# Patient Record
Sex: Female | Born: 1945 | Race: White | Hispanic: No | Marital: Married | Smoking: Never smoker
Health system: Southern US, Community
[De-identification: ages and names within clinical notes are randomized; demographics above are authoritative.]

## PROBLEM LIST (undated history)

## (undated) DIAGNOSIS — L409 Psoriasis, unspecified: Secondary | ICD-10-CM

## (undated) DIAGNOSIS — M1990 Unspecified osteoarthritis, unspecified site: Secondary | ICD-10-CM

## (undated) HISTORY — DX: Psoriasis, unspecified: L40.9

## (undated) HISTORY — PX: TOTAL HIP ARTHROPLASTY: SHX124

## (undated) HISTORY — DX: Unspecified osteoarthritis, unspecified site: M19.90

## (undated) HISTORY — PX: JOINT REPLACEMENT: SHX530

---

## 1966-01-15 HISTORY — PX: OTHER SURGICAL HISTORY: SHX169

## 2003-01-16 HISTORY — PX: VEIN LIGATION AND STRIPPING: SHX2653

## 2011-01-16 DIAGNOSIS — H9311 Tinnitus, right ear: Secondary | ICD-10-CM

## 2011-01-16 HISTORY — DX: Tinnitus, right ear: H93.11

## 2017-01-15 HISTORY — PX: OTHER SURGICAL HISTORY: SHX169

## 2019-01-02 LAB — HM DEXA SCAN

## 2019-01-05 ENCOUNTER — Other Ambulatory Visit (HOSPITAL_COMMUNITY): Payer: Self-pay | Admitting: Interventional Radiology

## 2019-01-05 ENCOUNTER — Encounter: Payer: Self-pay | Admitting: Physician Assistant

## 2019-01-05 DIAGNOSIS — M8580 Other specified disorders of bone density and structure, unspecified site: Secondary | ICD-10-CM | POA: Insufficient documentation

## 2019-01-05 DIAGNOSIS — M8588 Other specified disorders of bone density and structure, other site: Secondary | ICD-10-CM

## 2019-01-05 DIAGNOSIS — S22070A Wedge compression fracture of T9-T10 vertebra, initial encounter for closed fracture: Secondary | ICD-10-CM

## 2019-01-19 ENCOUNTER — Other Ambulatory Visit: Payer: Self-pay | Admitting: Radiology

## 2019-01-19 ENCOUNTER — Other Ambulatory Visit: Payer: Self-pay

## 2019-01-20 ENCOUNTER — Other Ambulatory Visit: Payer: Self-pay

## 2019-01-20 ENCOUNTER — Ambulatory Visit (INDEPENDENT_AMBULATORY_CARE_PROVIDER_SITE_OTHER): Payer: Medicare Other | Admitting: Physician Assistant

## 2019-01-20 ENCOUNTER — Encounter: Payer: Self-pay | Admitting: Physician Assistant

## 2019-01-20 ENCOUNTER — Encounter (HOSPITAL_COMMUNITY): Payer: Self-pay

## 2019-01-20 ENCOUNTER — Ambulatory Visit (HOSPITAL_COMMUNITY)
Admission: RE | Admit: 2019-01-20 | Discharge: 2019-01-20 | Disposition: A | Discharge status: Home / Self Care | Payer: Medicare Other | Source: Ambulatory Visit | Attending: Interventional Radiology | Admitting: Interventional Radiology

## 2019-01-20 VITALS — BP 136/80 | HR 78 | Temp 97.3°F | Ht 67.0 in | Wt 196.5 lb

## 2019-01-20 DIAGNOSIS — W19XXXA Unspecified fall, initial encounter: Secondary | ICD-10-CM | POA: Insufficient documentation

## 2019-01-20 DIAGNOSIS — M48061 Spinal stenosis, lumbar region without neurogenic claudication: Secondary | ICD-10-CM | POA: Insufficient documentation

## 2019-01-20 DIAGNOSIS — S22070A Wedge compression fracture of T9-T10 vertebra, initial encounter for closed fracture: Secondary | ICD-10-CM | POA: Insufficient documentation

## 2019-01-20 DIAGNOSIS — M2011 Hallux valgus (acquired), right foot: Secondary | ICD-10-CM

## 2019-01-20 DIAGNOSIS — M159 Polyosteoarthritis, unspecified: Secondary | ICD-10-CM | POA: Insufficient documentation

## 2019-01-20 DIAGNOSIS — L409 Psoriasis, unspecified: Secondary | ICD-10-CM | POA: Diagnosis not present

## 2019-01-20 DIAGNOSIS — Z7982 Long term (current) use of aspirin: Secondary | ICD-10-CM | POA: Diagnosis not present

## 2019-01-20 DIAGNOSIS — M1990 Unspecified osteoarthritis, unspecified site: Secondary | ICD-10-CM | POA: Insufficient documentation

## 2019-01-20 DIAGNOSIS — M549 Dorsalgia, unspecified: Secondary | ICD-10-CM | POA: Diagnosis not present

## 2019-01-20 DIAGNOSIS — R9431 Abnormal electrocardiogram [ECG] [EKG]: Secondary | ICD-10-CM | POA: Insufficient documentation

## 2019-01-20 DIAGNOSIS — H9311 Tinnitus, right ear: Secondary | ICD-10-CM | POA: Insufficient documentation

## 2019-01-20 HISTORY — PX: IR KYPHO THORACIC WITH BONE BIOPSY: IMG5518

## 2019-01-20 LAB — BASIC METABOLIC PANEL
Anion gap: 11 (ref 5–15)
BUN: 20 mg/dL (ref 8–23)
CO2: 24 mmol/L (ref 22–32)
Calcium: 9.3 mg/dL (ref 8.9–10.3)
Chloride: 107 mmol/L (ref 98–111)
Creatinine, Ser: 0.59 mg/dL (ref 0.44–1.00)
GFR calc Af Amer: >60 mL/min (ref >60)
GFR calc non Af Amer: >60 mL/min (ref >60)
Glucose, Bld: 103 mg/dL — ABNORMAL HIGH (ref 70–99)
Potassium: 4 mmol/L (ref 3.5–5.1)
Sodium: 142 mmol/L (ref 135–145)

## 2019-01-20 LAB — CBC
HCT: 44 % (ref 36.0–46.0)
Hemoglobin: 14 g/dL (ref 12.0–15.0)
MCH: 29.4 pg (ref 26.0–34.0)
MCHC: 31.8 g/dL (ref 30.0–36.0)
MCV: 92.2 fL (ref 80.0–100.0)
Platelets: 305 K/uL (ref 150–400)
RBC: 4.77 MIL/uL (ref 3.87–5.11)
RDW: 14.7 % (ref 11.5–15.5)
WBC: 7.1 K/uL (ref 4.0–10.5)
nRBC: 0 % (ref 0.0–0.2)

## 2019-01-20 LAB — URINALYSIS, COMPLETE (UACMP) WITH MICROSCOPIC
Bacteria, UA: NONE SEEN
Bilirubin Urine: NEGATIVE
Glucose, UA: NEGATIVE mg/dL
Hgb urine dipstick: NEGATIVE
Ketones, ur: NEGATIVE mg/dL
Leukocytes,Ua: NEGATIVE
Nitrite: NEGATIVE
Protein, ur: NEGATIVE mg/dL
Specific Gravity, Urine: 1.024 (ref 1.005–1.030)
pH: 5 (ref 5.0–8.0)

## 2019-01-20 LAB — PROTIME-INR
INR: 1 (ref 0.8–1.2)
Prothrombin Time: 12.9 s (ref 11.4–15.2)

## 2019-01-20 MED ORDER — IOHEXOL 300 MG/ML  SOLN
50.0000 mL | Freq: Once | INTRAMUSCULAR | Status: AC | PRN
  Administered 2019-01-20: 13:00:00 3 mL

## 2019-01-20 MED ORDER — ACETAMINOPHEN 325 MG PO TABS
650.0000 mg | ORAL_TABLET | Freq: Once | ORAL | Status: AC
  Administered 2019-01-20: 650 mg via ORAL

## 2019-01-20 MED ORDER — SODIUM CHLORIDE 0.9 % IV SOLN: INTRAVENOUS | Status: DC

## 2019-01-20 MED ORDER — CEFAZOLIN SODIUM-DEXTROSE 2-4 GM/100ML-% IV SOLN: 2.0000 g | INTRAVENOUS | Status: AC

## 2019-01-20 MED ORDER — BUPIVACAINE HCL (PF) 0.5 % IJ SOLN
INTRAMUSCULAR | Status: AC | PRN
  Administered 2019-01-20: 20 mL

## 2019-01-20 MED ORDER — FENTANYL CITRATE (PF) 100 MCG/2ML IJ SOLN
INTRAMUSCULAR | Status: AC
  Filled 2019-01-20: qty 2

## 2019-01-20 MED ORDER — FENTANYL CITRATE (PF) 100 MCG/2ML IJ SOLN
INTRAMUSCULAR | Status: AC | PRN
  Administered 2019-01-20 (×2): 25 ug via INTRAVENOUS

## 2019-01-20 MED ORDER — MIDAZOLAM HCL 2 MG/2ML IJ SOLN
INTRAMUSCULAR | Status: AC
  Filled 2019-01-20: qty 2

## 2019-01-20 MED ORDER — ACETAMINOPHEN 325 MG PO TABS
ORAL_TABLET | ORAL | Status: AC
  Filled 2019-01-20: qty 2

## 2019-01-20 MED ORDER — GELATIN ABSORBABLE 12-7 MM EX MISC
CUTANEOUS | Status: AC
  Filled 2019-01-20: qty 1

## 2019-01-20 MED ORDER — SODIUM CHLORIDE 0.9 % IV SOLN: INTRAVENOUS | Status: AC

## 2019-01-20 MED ORDER — BUPIVACAINE HCL (PF) 0.5 % IJ SOLN
INTRAMUSCULAR | Status: AC
  Filled 2019-01-20: qty 30

## 2019-01-20 MED ORDER — TOBRAMYCIN SULFATE 1.2 G IJ SOLR
INTRAMUSCULAR | Status: AC
  Filled 2019-01-20: qty 1.2

## 2019-01-20 MED ORDER — CEFAZOLIN SODIUM-DEXTROSE 2-4 GM/100ML-% IV SOLN
INTRAVENOUS | Status: AC
  Administered 2019-01-20: 2 g via INTRAVENOUS
  Filled 2019-01-20: qty 100

## 2019-01-20 MED ORDER — MIDAZOLAM HCL 2 MG/2ML IJ SOLN
INTRAMUSCULAR | Status: AC | PRN
  Administered 2019-01-20: 1 mg via INTRAVENOUS
  Administered 2019-01-20 (×2): 0.5 mg via INTRAVENOUS

## 2019-01-20 NOTE — Discharge Instructions (Signed)
Balloon Kyphoplasty, Care After This sheet gives you information about how to care for yourself after your procedure. Your health care provider may also give you more specific instructions. If you have problems or questions, contact your health care provider. What can I expect after the procedure? After your procedure, it is common to have back pain. Follow these instructions at home: Medicines  Take over-the-counter and prescription medicines only as told by your health care provider.  Ask your health care provider if the medicine prescribed to you: ? Requires you to avoid driving or using heavy machinery. ? Can cause constipation. You may need to take steps to prevent or treat constipation, such as:  Drink enough fluid to keep your urine pale yellow.  Take over-the-counter or prescription medicines.  Eat foods that are high in fiber, such as beans, whole grains, and fresh fruits and vegetables.  Limit foods that are high in fat and processed sugars, such as fried or sweet foods. Puncture site care   Follow instructions from your health care provider about how to take care of your puncture site. Make sure you: ? Wash your hands with soap and water before and after you change your bandage (dressing). If soap and water are not available, use hand sanitizer. ? Change your dressing as told by your health care provider. ? Leave skin glue or adhesive strips in place. These skin closures may need to be in place for 2 weeks or longer. If adhesive strip edges start to loosen and curl up, you may trim the loose edges. Do not remove adhesive strips completely unless your health care provider tells you to do that.  Check your puncture site every day for signs of infection. Watch for: ? Redness, swelling, or pain. ? Fluid or blood. ? Warmth. ? Pus or a bad smell.  Keep your dressing dry until your health care provider says that it can be removed. Managing pain, stiffness, and swelling   If  directed, put ice on the painful area. ? Put ice in a plastic bag. ? Place a towel between your skin and the bag. ? Leave the ice on for 20 minutes, 2-3 times a day. Activity  Rest your back and avoid intense physical activity for as long as told by your health care provider.  Avoid bending, lifting, or twisting your back for as long as told by your health care provider.  Return to your normal activities as told by your health care provider. Ask your health care provider what activities are safe for you.  Do not lift anything that is heavier than 5 lb (2.2 kg). You may need to avoid heavy lifting for several weeks. General instructions  Do not use any products that contain nicotine or tobacco, such as cigarettes, e-cigarettes, and chewing tobacco. These can delay bone healing. If you need help quitting, ask your health care provider.  Do not drive for 24 hours if you were given a sedative during your procedure.  Keep all follow-up visits as told by your health care provider. This is important. Contact a health care provider if:  You have a fever or chills.  You have redness, swelling, or pain at the site of your puncture.  You have fluid, blood, or pus coming from the puncture site.  You have pain that gets worse or does not get better with medicine.  You develop numbness or weakness in any part of your body. Get help right away if:  You have chest pain.  You have difficulty breathing.  You have weakness, numbness, or tingling in your legs.  You cannot control your bladder or bowel movements.  You suddenly become weak or numb on one side of your body.  You become very confused.  You have trouble speaking or understanding, or both. Summary  Follow instructions from your health care provider about how to take care of your puncture site.  Take over-the-counter and prescription medicines only as told by your health care provider.  Rest your back and avoid intense  physical activity for as long as told by your health care provider.  Contact a health care provider if you have pain that gets worse or does not get better with medicine.  Keep all follow-up visits as told by your health care provider. This is important. This information is not intended to replace advice given to you by your health care provider. Make sure you discuss any questions you have with your health care provider. Document Revised: 12/09/2017 Document Reviewed: 12/09/2017 Elsevier Patient Education  2020 [employer]   1.. No stooping,or bending or lifting more than 10 lbs for 2 weeks. 2.. Use walker to ambulate for 2 weeks. Marland Kitchen3.No driving for 2 weeks. 4.RTC PRN in 2 weeks.

## 2019-01-20 NOTE — Progress Notes (Signed)
Urine obtained and sent to lab  

## 2019-01-20 NOTE — Procedures (Signed)
S/P T 10 balloon KP. S.Zorina Mallin MD 

## 2019-01-20 NOTE — H&P (Signed)
Chief Complaint: Patient was seen in consultation today for T 10 Kyphoplasty at the request of Dr Renda Rolls   Supervising Physician: Julieanne Cotton  Patient Status: [facility] - Out-pt  History of Present Illness: [name] is a 74 y.o. female   Pt has had increasing back pain in mid to low back since October 2020 Moved to [place], [place] from [place] Did have fall in moving-- but pain was actually already there before fall Pain meds without relief Pain is 7-8/10  Of note, pt has had previous KP maybe 6 yrs ago-- not sure of level  MRI 12/25/18: IMPRESSION: 1. Acute/subacute mild superior endplate compression fracture of T10 with associated bone marrow edema. No significant osseous retropulsion. 2. Previous T7 spinal augmentation. No residual bone marrow edema. 3. Probable insufficiency fracture of the left sacrum, incompletely visualized. 4. 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 5. 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 Mild left foraminal narrowing at L2-3. Additional disc degeneration and small disc protrusions as described. 6. 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  Referral to Dr Corliss Skains for evaluation and management/treatment of T10 painful fracture Here today for same  Past Medical History:  Diagnosis Date  . Arthritis   . Psoriasis    Intermittent; all over body and waxes and wanes  . Tinnitus of right ear 2013   Intermittent; Melatonin has helped  . Vaginal delivery 1973, 1981    Past Surgical History:  Procedure Laterality Date  . 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 JOINT REPLACEMENT    . Knee replacement Right 2019  . TOTAL HIP ARTHROPLASTY     Lt 2002, Rt 2005  . VEIN LIGATION AND STRIPPING Right 2005     Allergies: Patient has no known allergies.  Medications: Prior to Admission medications   Medication Sig Start Date End Date Taking? Authorizing Provider  aspirin EC 81 MG tablet Take 81 mg by mouth daily.    [provider]  ibuprofen (ADVIL) 200 MG tablet Take 600 mg by mouth every 6 (six) hours as needed.    [provider]  Melatonin 10 MG SUBL Place 1 tablet under the tongue at bedtime.    [provider]  naproxen sodium (ALEVE) 220 MG tablet Take 220 mg by mouth daily as needed.    [provider]     Family History  Problem Relation Age of Onset  . Dementia Mother   . Heart disease Father        CABG x 5  . Psoriasis Sister   . Osteoarthritis Sister   . Pneumonia Maternal Grandfather   . Heart attack Paternal Grandmother     Social History   Socioeconomic History  . Marital status: Married    Spouse name: Not on file  . Number of children: Not on file  . Years of education: Not on file  . Highest education level: Not on file  Occupational History  . Not on file  Tobacco Use  . Smoking status: Never Smoker  . Smokeless tobacco: Never Used  Substance and Sexual Activity  . Alcohol use: Never  . Drug use: Never  . Sexual activity: Not Currently  Other Topics Concern  . 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 Difficulty of Paying Living Expenses: Not on file  Food Insecurity:   . Worried About Charity fundraiser in the Last Year: Not on file  . Ran Out of Food in the Last Year: Not on file  Transportation Needs:   . Lack of Transportation (Medical): Not on file  . Lack of Transportation (Non-Medical): Not on file  Physical Activity:   . Days of Exercise per Week: Not on file  . Minutes of Exercise per Session: Not on file  Stress:   . Feeling of Stress : Not on file  Social  Connections:   . Frequency of Communication with Friends and Family: Not on file  . Frequency of Social Gatherings with Friends and Family: Not on file  . Attends Religious Services: Not on file  . Active Member of Clubs or Organizations: Not on file  . Attends Archivist Meetings: Not on file  . Marital Status: Not on file    Review of Systems: A 12 point ROS discussed and pertinent positives are indicated in the HPI above.  All other systems are negative.  Review of Systems  Constitutional: Positive for activity change. Negative for fatigue.  Respiratory: Negative for cough and shortness of breath.   Cardiovascular: Negative for chest pain.  Gastrointestinal: Negative for abdominal pain.  Musculoskeletal: Positive for back pain.  Neurological: Negative for weakness.  Psychiatric/Behavioral: Negative for behavioral problems and confusion.    Vital Signs: BP 136/82   Pulse 81   Temp 97.9 F (36.6 C)   Resp 18   Ht 5\' 8"  (1.727 m)   Wt 190 lb (86.2 kg)   SpO2 96%   BMI 28.89 kg/m   Physical Exam Vitals reviewed.  Cardiovascular:     Rate and Rhythm: Normal rate and regular rhythm.     Heart sounds: Normal heart sounds.  Pulmonary:     Effort: Pulmonary effort is normal.     Breath sounds: Normal breath sounds.  Abdominal:     Palpations: Abdomen is soft.     Tenderness: There is no abdominal tenderness.  Musculoskeletal:        General: Normal range of motion.     Comments: Low back pain  Skin:    General: Skin is warm and dry.  Neurological:     Mental Status: She is alert.  Psychiatric:        Mood and Affect: Mood normal.        Behavior: Behavior normal.        Thought Content: Thought content normal.        Judgment: Judgment normal.     Imaging: No results found.  Labs:  CBC: No results for input(s): WBC, HGB, HCT, PLT in the last 8760 hours.  COAGS: No results for input(s): INR, APTT in the last 8760 hours.  BMP: No results for  input(s): NA, K, CL, CO2, GLUCOSE, BUN, CALCIUM, CREATININE, GFRNONAA, GFRAA in the last 8760 hours.  Invalid input(s): CMP  LIVER FUNCTION TESTS: No results for input(s): BILITOT, AST, ALT, ALKPHOS, PROT, ALBUMIN in the last 8760 hours.  TUMOR MARKERS: No results for input(s): AFPTM, CEA, CA199, CHROMGRNA in the last 8760 hours.  Assessment and Plan:  Painful T10 acute fracture MRI revealing edema and acute fracture Scheduled now for Kyphoplasty Risks and benefits of T10 KP were discussed with the patient  including, but not limited to education regarding the natural healing process of compression fractures without intervention, bleeding, infection, cement migration which may cause spinal cord damage, paralysis, pulmonary embolism or even death.  This interventional procedure involves the use of X-rays and because of the nature of the planned procedure, it is possible that we will have prolonged use of X-ray fluoroscopy.  Potential radiation risks to you include (but are not limited to) the following: - A slightly elevated risk for cancer  several years later in life. This risk is typically less than 0.5% percent. This risk is low in comparison to the normal incidence of human cancer, which is 33% for women and 50% for men according to the American Cancer Society. - Radiation induced injury can include skin redness, resembling a rash, tissue breakdown / ulcers and hair loss (which can be temporary or permanent).   The likelihood of either of these occurring depends on the difficulty of the procedure and whether you are sensitive to radiation due to previous procedures, disease, or genetic conditions.   IF your procedure requires a prolonged use of radiation, you will be notified and given written instructions for further action.  It is your responsibility to monitor the irradiated area for the 2 weeks following the procedure and to notify your physician if you are concerned that you have  suffered a radiation induced injury.    All of the patient's questions were answered, patient is agreeable to proceed.  Consent signed and in chart.  Thank you for this interesting consult.  I greatly enjoyed meeting [name] and look forward to participating in their care.  A copy of this report was sent to the requesting provider on this date.  Electronically Signed: Robet Leu, PA-C 01/20/2019, 10:31 AM   I spent a total of  30 Minutes   in face to face in clinical consultation, greater than 50% of which was counseling/coordinating care for T10 KP

## 2019-01-20 NOTE — Progress Notes (Signed)
Instructed to obtain urine speci. Ambulated to bathroom to void

## 2019-01-20 NOTE — Progress Notes (Signed)
[name] is a 74 y.o. female here to Establish care.  I acted as a Neurosurgeon for [employer], PA-C Corky Mull, LPN  History of Present Illness:   Chief Complaint  Patient presents with  . Establish Care   Bunion -- R foot with significant bunion; worsened after R knee surgery; having significant pain and her toes have started shifting; also has calluses forming on her toes.   Psoriasis -- has had shots and creams in the past, hasn't used in years. Currently uses cocoa butter in troublesome places. Uses cetaphil as well. Would like referral to local dermatologist.  Health Maintenance: Immunizations -- declined, will get Flu shot later Colonoscopy -- will get  (only has had 1) Mammogram -- overdue, last done 5-6 yrs ago PAP --No Hx of abnormal paps Bone Density -- UTD Weight -- Weight: 196 lb 8 oz (89.1 kg)   Depression screen [facility] 2/9 01/20/2019  Decreased Interest 0  Down, Depressed, Hopeless 0  PHQ - 2 Score 0   No flowsheet data found.   Other providers/specialists: Patient Care Team: [name], [place] as PCP - General (Physician Assistant)   Past Medical History:  Diagnosis Date  . Arthritis   . Psoriasis    Intermittent; all over body and waxes and wanes  . Tinnitus of right ear 2013   Intermittent; Melatonin has helped  . Vaginal delivery 1973, 1981     Social History   Socioeconomic History  . Marital status: Married    Spouse name: Not on file  . Number of children: Not on file  . Years of education: Not on file  . Highest education level: Not on file  Occupational History  . Not on file  Tobacco Use  . Smoking status: Never Smoker  . Smokeless tobacco: Never Used  Substance and Sexual Activity  . Alcohol use: Never  . Drug use: Never  . Sexual activity: Not Currently  Other Topics Concern  . 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 Difficulty of Paying Living Expenses: Not on file  Food Insecurity:   . Worried About Programme researcher, broadcasting/film/video in the Last Year: Not on file  . Ran Out of Food in the Last Year: Not on file  Transportation Needs:   . Lack of Transportation (Medical): Not on file  . Lack of Transportation (Non-Medical): Not on file  Physical Activity:   . Days of Exercise per Week: Not on file  . Minutes of Exercise per Session: Not on file  Stress:   . Feeling of Stress : Not on file  Social Connections:   . Frequency of Communication with Friends and Family: Not on file  . Frequency of Social Gatherings with Friends and Family: Not on file  . Attends Religious Services: Not on file  . Active Member of Clubs or Organizations: Not on file  . Attends Banker Meetings: Not on file  . Marital Status: Not on file  Intimate Partner Violence:   . Fear of Current or Ex-Partner: Not on file  . Emotionally Abused: Not on file  . Physically Abused: Not on file  . Sexually Abused: Not on file    Past Surgical History:  Procedure Laterality Date  . 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 JOINT REPLACEMENT    .  Knee replacement Right 2019  . TOTAL HIP ARTHROPLASTY     Lt 2002, Rt 2005  . VEIN LIGATION AND STRIPPING Right 2005    Family History  Problem Relation Age of Onset  . Dementia Mother   . Heart disease Father        CABG x 5  . Psoriasis Sister   . Osteoarthritis Sister   . Pneumonia Maternal Grandfather   . Heart attack Paternal Grandmother     No Known Allergies   Current Medications:   Current Outpatient Medications:  .  aspirin EC 81 MG tablet, Take 81 mg by mouth daily., Disp: , Rfl:  .  ibuprofen (ADVIL) 200 MG tablet, Take 600 mg by mouth every 6 (six) hours as needed., Disp: , Rfl:  .  Melatonin 10 MG SUBL, Place 1 tablet under the tongue at bedtime., Disp: , Rfl:  .  naproxen sodium (ALEVE) 220 MG tablet, Take 220 mg by mouth  daily as needed., Disp: , Rfl:    Review of Systems:   ROS  Negative unless otherwise specified per HPI.  Vitals:   Vitals:   01/20/19 0830  BP: 136/80  Pulse: 78  Temp: (!) 97.3 F (36.3 C)  TempSrc: Temporal  SpO2: 95%  Weight: 196 lb 8 oz (89.1 kg)  Height: 5\' 7"  (1.702 m)      Body mass index is 30.78 kg/m.  Physical Exam:   Physical Exam Vitals and nursing note reviewed.  Constitutional:      General: She is not in acute distress.    Appearance: She is well-developed. She is not ill-appearing or toxic-appearing.  Cardiovascular:     Rate and Rhythm: Normal rate and regular rhythm.     Pulses: Normal pulses.     Heart sounds: Normal heart sounds, S1 normal and S2 normal.     Comments: No LE edema Pulmonary:     Effort: Pulmonary effort is normal.     Breath sounds: Normal breath sounds.  Feet:     Comments: Significant hallux valgus deformity of R foot Skin:    General: Skin is warm and dry.     Comments: Scattered erythematous plaques to body, notably on R shin  Neurological:     Mental Status: She is alert.     GCS: GCS eye subscore is 4. GCS verbal subscore is 5. GCS motor subscore is 6.  Psychiatric:        Speech: Speech normal.        Behavior: Behavior normal. Behavior is cooperative.     Assessment and Plan:   [name] was seen today for establish care.  Diagnoses and all orders for this visit:  Hallux valgus (acquired), right foot Referral to podiatry per patient request. -     Ambulatory referral to Podiatry  Psoriasis Referral to dermatology per patient request. -     Ambulatory referral to Dermatology  . Reviewed expectations re: course of current medical issues. . Discussed self-management of symptoms. . Outlined signs and symptoms indicating need for more acute intervention. . Patient verbalized understanding and all questions were answered. . See orders for this visit as documented in the electronic medical record. . Patient  received an After-Visit Summary.  CMA or LPN served as scribe during this visit. History, Physical, and Plan performed by medical provider. The above documentation has been reviewed and is accurate and complete.  Inda Coke, PA-C

## 2019-01-20 NOTE — Progress Notes (Signed)
Discharge instructions with pt and her husband (via telephone) both voice understanding.

## 2019-01-20 NOTE — Patient Instructions (Addendum)
You will be contacted about your referrals to dermatology and podiatry.  Your bone scan showed osteopenia   Osteopenia  Osteopenia is a loss of thickness (density) inside of the bones. Another name for osteopenia is low bone mass. Mild osteopenia is a normal part of aging. It is not a disease, and it does not cause symptoms. However, if you have osteopenia and continue to lose bone mass, you could develop a condition that causes the bones to become thin and break more easily (osteoporosis). You may also lose some height, have back pain, and have a stooped posture. Although osteopenia is not a disease, making changes to your lifestyle and diet can help to prevent osteopenia from developing into osteoporosis. What are the causes? Osteopenia is caused by loss of calcium in the bones.  Bones are constantly changing. Old bone cells are continually being replaced with new bone cells. This process builds new bone. The mineral calcium is needed to build new bone and maintain bone density. Bone density is usually highest around age 73. After that, most people's bodies cannot replace all the bone they have lost with new bone. What increases the risk? You are more likely to develop this condition if:  You are older than age 38.  You are a woman who went through menopause early.  You have a long illness that keeps you in bed.  You do not get enough exercise.  You lack certain nutrients (malnutrition).  You have an overactive thyroid gland (hyperthyroidism).  You smoke.  You drink a lot of alcohol.  You are taking medicines that weaken the bones, such as steroids. What are the signs or symptoms? This condition does not cause any symptoms. You may have a slightly higher risk for bone breaks (fractures), so getting fractures more easily than normal may be an indication of osteopenia. How is this diagnosed? Your health care provider can diagnose this condition with a special type of X-ray exam  that measures bone density (dual-energy X-ray absorptiometry, DEXA). This test can measure bone density in your hips, spine, and wrists. Osteopenia has no symptoms, so this condition is usually diagnosed after a routine bone density screening test is done for osteoporosis. This routine screening is usually done for:  Women who are age 79 or older.  Men who are age 47 or older. If you have risk factors for osteopenia, you may have the screening test at an earlier age. How is this treated? Making dietary and lifestyle changes can lower your risk for osteoporosis. If you have severe osteopenia that is close to becoming osteoporosis, your health care provider may prescribe medicines and dietary supplements such as calcium and vitamin D. These supplements help to rebuild bone density. Follow these instructions at home:   Take over-the-counter and prescription medicines only as told by your health care provider. These include vitamins and supplements.  Eat a diet that is high in calcium and vitamin D. ? Calcium is found in dairy products, beans, salmon, and leafy green vegetables like spinach and broccoli. ? Look for foods that have vitamin D and calcium added to them (fortified foods), such as orange juice, cereal, and bread.  Do 30 or more minutes of a weight-bearing exercise every day, such as walking, jogging, or playing a sport. These types of exercises strengthen the bones.  Take precautions at home to lower your risk of falling, such as: ? Keeping rooms well-lit and free of clutter, such as cords. ? Installing safety rails on stairs. ?  Using rubber mats in the bathroom or other areas that are often wet or slippery.  Do not use any products that contain nicotine or tobacco, such as cigarettes and e-cigarettes. If you need help quitting, ask your health care provider.  Avoid alcohol or limit alcohol intake to no more than 1 drink a day for nonpregnant women and 2 drinks a day for men. One  drink equals 12 oz of beer, 5 oz of wine, or 1 oz of hard liquor.  Keep all follow-up visits as told by your health care provider. This is important. Contact a health care provider if:  You have not had a bone density screening for osteoporosis and you are: ? A woman, age 51 or older. ? A man, age 82 or older.  You are a postmenopausal woman who has not had a bone density screening for osteoporosis.  You are older than age 60 and you want to know if you should have bone density screening for osteoporosis. Summary  Osteopenia is a loss of thickness (density) inside of the bones. Another name for osteopenia is low bone mass.  Osteopenia is not a disease, but it may increase your risk for a condition that causes the bones to become thin and break more easily (osteoporosis).  You may be at risk for osteopenia if you are older than age 4 or if you are a woman who went through early menopause.  Osteopenia does not cause any symptoms, but it can be diagnosed with a bone density screening test.  Dietary and lifestyle changes are the first treatment for osteopenia. These may lower your risk for osteoporosis. This information is not intended to replace advice given to you by your health care provider. Make sure you discuss any questions you have with your health care provider. Document Revised: 12/14/2016 Document Reviewed: 10/10/2016 Elsevier Patient Education  2020 [employer].

## 2019-02-16 ENCOUNTER — Ambulatory Visit (INDEPENDENT_AMBULATORY_CARE_PROVIDER_SITE_OTHER): Payer: Medicare Other

## 2019-02-16 ENCOUNTER — Other Ambulatory Visit: Payer: Self-pay

## 2019-02-16 ENCOUNTER — Ambulatory Visit (INDEPENDENT_AMBULATORY_CARE_PROVIDER_SITE_OTHER): Payer: Medicare Other | Admitting: Podiatry

## 2019-02-16 ENCOUNTER — Other Ambulatory Visit: Payer: Self-pay | Admitting: Podiatry

## 2019-02-16 DIAGNOSIS — M79672 Pain in left foot: Secondary | ICD-10-CM | POA: Diagnosis not present

## 2019-02-16 DIAGNOSIS — M79671 Pain in right foot: Secondary | ICD-10-CM

## 2019-02-16 DIAGNOSIS — M2041 Other hammer toe(s) (acquired), right foot: Secondary | ICD-10-CM | POA: Diagnosis not present

## 2019-02-16 DIAGNOSIS — M2042 Other hammer toe(s) (acquired), left foot: Secondary | ICD-10-CM | POA: Diagnosis not present

## 2019-02-16 NOTE — Patient Instructions (Signed)
Bunion  A bunion is a bump on the base of the big toe that forms when the bones of the big toe joint move out of position. Bunions may be small at first, but they often get larger over time. They can make walking painful. What are the causes? A bunion may be caused by:  Wearing narrow or pointed shoes that force the big toe to press against the other toes.  Abnormal foot development that causes the foot to roll inward (pronate).  Changes in the foot that are caused by certain diseases, such as rheumatoid arthritis or polio.  A foot injury. What increases the risk? The following factors may make you more likely to develop this condition:  Wearing shoes that squeeze the toes together.  Having certain diseases, such as: ? Rheumatoid arthritis. ? Polio. ? Cerebral palsy.  Having family members who have bunions.  Being born with a foot deformity, such as flat feet or low arches.  Doing activities that put a lot of pressure on the feet, such as ballet dancing. What are the signs or symptoms? The main symptom of a bunion is a noticeable bump on the big toe. Other symptoms may include:  Pain.  Swelling around the big toe.  Redness and inflammation.  Thick or hardened skin on the big toe or between the toes.  Stiffness or loss of motion in the big toe.  Trouble with walking. How is this diagnosed? A bunion may be diagnosed based on your symptoms, medical history, and activities. You may have tests, such as:  X-rays. These allow your health care provider to check the position of the bones in your foot and look for damage to your joint. They also help your health care provider determine the severity of your bunion and the best way to treat it.  Joint aspiration. In this test, a sample of fluid is removed from the toe joint. This test may be done if you are in a lot of pain. It helps rule out diseases that cause painful swelling of the joints, such as arthritis. How is this  treated? Treatment depends on the severity of your symptoms. The goal of treatment is to relieve symptoms and prevent the bunion from getting worse. Your health care provider may recommend:  Wearing shoes that have a wide toe box.  Using bunion pads to cushion the affected area.  Taping your toes together to keep them in a normal position.  Placing a device inside your shoe (orthotics) to help reduce pressure on your toe joint.  Taking medicine to ease pain, inflammation, and swelling.  Applying heat or ice to the affected area.  Doing stretching exercises.  Surgery to remove scar tissue and move the toes back into their normal position. This treatment is rare. Follow these instructions at home: Managing pain, stiffness, and swelling   If directed, put ice on the painful area: ? Put ice in a plastic bag. ? Place a towel between your skin and the bag. ? Leave the ice on for 20 minutes, 2-3 times a day. Activity   If directed, apply heat to the affected area before you exercise. Use the heat source that your health care provider recommends, such as a moist heat pack or a heating pad. ? Place a towel between your skin and the heat source. ? Leave the heat on for 20-30 minutes. ? Remove the heat if your skin turns bright red. This is especially important if you are unable to feel pain,   heat, or cold. You may have a greater risk of getting burned.  Do exercises as told by your health care provider. General instructions  Support your toe joint with proper footwear, shoe padding, or taping as told by your health care provider.  Take over-the-counter and prescription medicines only as told by your health care provider.  Keep all follow-up visits as told by your health care provider. This is important. Contact a health care provider if your symptoms:  Get worse.  Do not improve in 2 weeks. Get help right away if you have:  Severe pain and trouble with walking. Summary  A  bunion is a bump on the base of the big toe that forms when the bones of the big toe joint move out of position.  Bunions can make walking painful.  Treatment depends on the severity of your symptoms.  Support your toe joint with proper footwear, shoe padding, or taping as told by your health care provider. This information is not intended to replace advice given to you by your health care provider. Make sure you discuss any questions you have with your health care provider. Document Revised: 07/08/2017 Document Reviewed: 05/14/2017 Elsevier Patient Education  2020 [employer]  

## 2019-02-16 NOTE — Progress Notes (Signed)
Subjective:   Patient ID: [name], female   DOB: 74 y.o.   MRN: [id number]   HPI Patient presents stating I have a painful bunion deformity right and hammertoe deformity second and third right over left and states that she is needed surgery on this but she had to first have her knee done last year and she is now ready to have this corrected.  States she is tried wider shoes and other modalities without relief and patient does not smoke likes to be active   Review of Systems  All other systems reviewed and are negative.       Objective:  Physical Exam Vitals and nursing note reviewed.  Constitutional:      Appearance: She is well-developed.  Pulmonary:     Effort: Pulmonary effort is normal.  Musculoskeletal:        General: Normal range of motion.  Skin:    General: Skin is warm.  Neurological:     Mental Status: She is alert.     Neurovascular status found to be intact muscle strength found to be adequate range of motion within normal limits.  Patient is found to have large painful first metatarsal head right with redness around the joint surface and deviation of the hallux with elongated second and third digits with distal keratotic lesions digit to right digit 3 right with history of arthroplasty 2 4 right.  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  I did explain there would still be deviation of the big toe but I do feel like this will be of benefit and patient wants to go this route and at this point I did discuss distal arthroplasty along with digital fusion digits 2  3.  Patient is scheduled to have this done and will be seen back 1 week for consultation for correction  X-rays indicate significant structural bunion deformity right over left with elongated digital deformity second and third toes

## 2019-02-23 ENCOUNTER — Encounter: Payer: Self-pay | Admitting: Podiatry

## 2019-02-23 ENCOUNTER — Ambulatory Visit (INDEPENDENT_AMBULATORY_CARE_PROVIDER_SITE_OTHER): Payer: Medicare Other | Admitting: Podiatry

## 2019-02-23 ENCOUNTER — Other Ambulatory Visit: Payer: Self-pay

## 2019-02-23 VITALS — Temp 97.4°F

## 2019-02-23 DIAGNOSIS — M21619 Bunion of unspecified foot: Secondary | ICD-10-CM | POA: Diagnosis not present

## 2019-02-23 DIAGNOSIS — M2041 Other hammer toe(s) (acquired), right foot: Secondary | ICD-10-CM | POA: Diagnosis not present

## 2019-02-23 DIAGNOSIS — M2042 Other hammer toe(s) (acquired), left foot: Secondary | ICD-10-CM | POA: Diagnosis not present

## 2019-02-23 NOTE — Patient Instructions (Signed)
Pre-Operative Instructions  Congratulations, you have decided to take an important step towards improving your quality of life.  You can be assured that the doctors and staff at [facility] will be with you every step of the way.  Here are some important things you should know:  1. Plan to be at the surgery center/hospital at least 1 (one) hour prior to your scheduled time, unless otherwise directed by the surgical center/hospital staff.  You must have a responsible adult accompany you, remain during the surgery and drive you home.  Make sure you have directions to the surgical center/hospital to ensure you arrive on time. 2. If you are having surgery at [facility] or [facility] hospitals, you will need a copy of your medical history and physical form from your family physician within one month prior to the date of surgery. We will give you a form for your primary physician to complete.  3. We make every effort to accommodate the date you request for surgery.  However, there are times where surgery dates or times have to be moved.  We will contact you as soon as possible if a change in schedule is required.   4. No aspirin/ibuprofen for one week before surgery.  If you are on aspirin, any non-steroidal anti-inflammatory medications (Mobic, Aleve, Ibuprofen) should not be taken seven (7) days prior to your surgery.  You make take Tylenol for pain prior to surgery.  5. Medications - If you are taking daily heart and blood pressure medications, seizure, reflux, allergy, asthma, anxiety, pain or diabetes medications, make sure you notify the surgery center/hospital before the day of surgery so they can tell you which medications you should take or avoid the day of surgery. 6. No food or drink after midnight the night before surgery unless directed otherwise by surgical center/hospital staff. 7. No alcoholic beverages 24-hours prior to surgery.  No smoking 24-hours prior or 24-hours after  surgery. 8. Wear loose pants or shorts. They should be loose enough to fit over bandages, boots, and casts. 9. Don't wear slip-on shoes. Sneakers are preferred. 10. Bring your boot with you to the surgery center/hospital.  Also bring crutches or a walker if your physician has prescribed it for you.  If you do not have this equipment, it will be provided for you after surgery. 11. If you have not been contacted by the surgery center/hospital by the day before your surgery, call to confirm the date and time of your surgery. 12. Leave-time from work may vary depending on the type of surgery you have.  Appropriate arrangements should be made prior to surgery with your employer. 13. Prescriptions will be provided immediately following surgery by your doctor.  Fill these as soon as possible after surgery and take the medication as directed. Pain medications will not be refilled on weekends and must be approved by the doctor. 14. Remove nail polish on the operative foot and avoid getting pedicures prior to surgery. 15. Wash the night before surgery.  The night before surgery wash the foot and leg well with water and the antibacterial soap provided. Be sure to pay special attention to beneath the toenails and in between the toes.  Wash for at least three (3) minutes. Rinse thoroughly with water and dry well with a towel.  Perform this wash unless told not to do so by your physician.  Enclosed: 1 Ice pack (please put in freezer the night before surgery)   1 Hibiclens skin cleaner     Pre-op instructions  If you have any questions regarding the instructions, please do not hesitate to call our office.  [place]: [address], [place], [place] [address] -- [phone]  [place]: [address] [place], [place] [address] -- [phone]  [place]: [address], [place], [place] [address] -- [phone]   Website: [email or web address] 

## 2019-02-23 NOTE — Progress Notes (Signed)
Subjective:   Patient ID: [name], female   DOB: 74 y.o.   MRN: [id number]   HPI Patient presents with significant structural bunion deformity right and hammertoe deformity second third toes that she is getting fixed in the next several weeks   ROS      Objective:  Physical Exam  Neurovascular status intact with patient noted to have significant structural malalignment first metatarsal right and digits 2 and 3 right foot     Assessment:  HAV deformity chronic hammertoe deformity     Plan:  H&P reviewed condition recommended correction of deformity.  Allowed patient to read consent form going over alternative treatments complications associated with correction and patient is willing to undergo surgery understanding all risk and at this point after extensive review signed consent form understanding risk and the fact that total recovery can take 6 months to 1 year.  Patient is scheduled for outpatient surgery and did have air fracture walker dispensed with all instructions on usage today and is encouraged to call office with questions concerns

## 2019-03-03 ENCOUNTER — Encounter: Payer: Self-pay | Admitting: Podiatry

## 2019-03-03 DIAGNOSIS — M2011 Hallux valgus (acquired), right foot: Secondary | ICD-10-CM

## 2019-03-03 DIAGNOSIS — M2041 Other hammer toe(s) (acquired), right foot: Secondary | ICD-10-CM | POA: Diagnosis not present

## 2019-03-13 ENCOUNTER — Ambulatory Visit (INDEPENDENT_AMBULATORY_CARE_PROVIDER_SITE_OTHER): Payer: Medicare Other

## 2019-03-13 ENCOUNTER — Ambulatory Visit (INDEPENDENT_AMBULATORY_CARE_PROVIDER_SITE_OTHER): Payer: Medicare Other | Admitting: Podiatry

## 2019-03-13 ENCOUNTER — Encounter: Payer: Self-pay | Admitting: Podiatry

## 2019-03-13 ENCOUNTER — Other Ambulatory Visit: Payer: Self-pay

## 2019-03-13 DIAGNOSIS — M21619 Bunion of unspecified foot: Secondary | ICD-10-CM | POA: Diagnosis not present

## 2019-03-13 DIAGNOSIS — M21611 Bunion of right foot: Secondary | ICD-10-CM | POA: Diagnosis not present

## 2019-03-13 DIAGNOSIS — Z9889 Other specified postprocedural states: Secondary | ICD-10-CM

## 2019-03-13 NOTE — Progress Notes (Signed)
Subjective:   Patient ID: [name], female   DOB: 74 y.o.   MRN: [id number]   HPI Patient presents stating she is doing very well with surgery and very pleased with how everything is going so far   ROS      Objective:  Physical Exam  Neuro neurovascular status intact with patient's right foot healing well wound edges well coapted hallux in rectus position second and third toes good alignment with pins in place     Assessment:  Doing well post digital fusions and metatarsal osteotomy right     Plan:  H&P reviewed conditions and recommended continued immobilization elevation compression and reapplied sterile dressing.  Reappoint for suture removal or earlier if needed  X-rays indicate osteotomy healing well fixation in place digits in good alignment fixation in place

## 2019-03-18 ENCOUNTER — Ambulatory Visit: Payer: Medicare Other | Admitting: Podiatry

## 2019-04-06 ENCOUNTER — Ambulatory Visit (INDEPENDENT_AMBULATORY_CARE_PROVIDER_SITE_OTHER): Payer: Medicare Other

## 2019-04-06 ENCOUNTER — Other Ambulatory Visit: Payer: Self-pay

## 2019-04-06 ENCOUNTER — Ambulatory Visit (INDEPENDENT_AMBULATORY_CARE_PROVIDER_SITE_OTHER): Payer: Medicare Other | Admitting: Podiatry

## 2019-04-06 ENCOUNTER — Encounter: Payer: Self-pay | Admitting: Podiatry

## 2019-04-06 VITALS — Temp 97.1°F

## 2019-04-06 DIAGNOSIS — M2011 Hallux valgus (acquired), right foot: Secondary | ICD-10-CM | POA: Diagnosis not present

## 2019-04-06 NOTE — Progress Notes (Signed)
Subjective:   Patient ID: [name], female   DOB: 74 y.o.   MRN: [id number]   HPI Patient presents stating overall doing well ready to get pins and sutures out   ROS      Objective:  Physical Exam  Neurovascular status intact with patient found to have pins intact second and third digit good overall correction bunion deformity right with slight under correction which was expected given preoperative deformity     Assessment:  Doing okay overall after having foot surgery right with negative Denna Haggard' sign noted good alignment noted     Plan:  H&P reviewed condition and recommended continuation of conservative care and pins removed second and third toes and applied bandages to the toes stitches removed with several that we will test removed at next visit or earlier if needed as able to get almost all of them taking care of and patient will be seen back to recheck 5 weeks and may gradually return to soft shoes  X-rays indicate good alignment with no indications of pathology currently with fixation in place

## 2019-04-06 NOTE — Addendum Note (Signed)
Addended by: Fritz Pickerel A on: 04/06/2019 05:47 PM   Modules accepted: Orders

## 2019-04-08 ENCOUNTER — Telehealth: Payer: Self-pay | Admitting: *Deleted

## 2019-04-08 NOTE — Telephone Encounter (Signed)
Pt states she had surgery with Dr. Charlsie Merles 5 weeks ago, pin removal 04/06/2019 and now has severe left knee pain under the knee cap.

## 2019-04-08 NOTE — Telephone Encounter (Signed)
I spoke with pt and asked if the pain was in the left knee crease or soft tissue or calf, and pt denied. I asked if she had redness, tenderness or swelling in the calf or knee crease and pt denied. I told pt I felt it was more of a orthopedic problem and to contact her orthopedic physician.

## 2019-04-09 ENCOUNTER — Other Ambulatory Visit: Payer: Self-pay

## 2019-04-09 ENCOUNTER — Ambulatory Visit (INDEPENDENT_AMBULATORY_CARE_PROVIDER_SITE_OTHER): Payer: Medicare Other

## 2019-04-09 DIAGNOSIS — Z0000 Encounter for general adult medical examination without abnormal findings: Secondary | ICD-10-CM

## 2019-04-09 NOTE — Progress Notes (Signed)
This visit is being conducted via phone call due to the COVID-19 pandemic. This patient has given me verbal consent via phone to conduct this visit, patient states they are participating from their home address. Some vital signs may be absent or patient reported.   Patient identification: identified by name, DOB, and current address.  Location provider: [facility], Office Persons participating in the virtual visit: [name] George LPN, patient, and [name] Coke PA     Subjective:   [name] is a 74 y.o. female who presents for Medicare Annual (Subsequent) preventive examination.  Review of Systems:   Cardiac Risk Factors include: advanced age (>23men, >61 women)    Objective:     Vitals: There were no vitals taken for this visit.  There is no height or weight on file to calculate BMI.  Advanced Directives 04/09/2019 01/20/2019  Does Patient Have a Medical Advance Directive? Yes Yes  Type of Advance Directive Living will;Healthcare Power of [place];Living will  Does patient want to make changes to medical advance directive? No - Patient declined No - Patient declined  Copy of [place] in Chart? No - copy requested No - copy requested    Tobacco Social History   Tobacco Use  Smoking Status Never Smoker  Smokeless Tobacco Never Used     Counseling given: Not Answered   Clinical Intake:  Pre-visit preparation completed: Yes  Diabetes: No  How often do you need to have someone help you when you read instructions, pamphlets, or other written materials from your doctor or pharmacy?: 1 - Never  Interpreter Needed?: No  Information entered by :: [name] George LPN  Past Medical History:  Diagnosis Date  . Arthritis   . Psoriasis    Intermittent; all over body and waxes and wanes  . Tinnitus of right ear 2013   Intermittent; Melatonin has helped  . Vaginal delivery 1973, 1981   Past Surgical History:  Procedure  Laterality Date  . 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 IR KYPHO THORACIC WITH BONE BIOPSY  01/20/2019  . JOINT REPLACEMENT    . Knee replacement Right 2019  . TOTAL HIP ARTHROPLASTY     Lt 2002, Rt 2005  . VEIN LIGATION AND STRIPPING Right 2005   Family History  Problem Relation Age of Onset  . Dementia Mother   . Heart disease Father        CABG x 5  . Psoriasis Sister   . Osteoarthritis Sister   . Pneumonia Maternal Grandfather   . Heart attack Paternal Grandmother    Social History   Socioeconomic History  . Marital status: Married    Spouse name: Not on file  . Number of children: Not on file  . Years of education: Not on file  . Highest education level: Not on file  Occupational History  . Not on file  Tobacco Use  . Smoking status: Never Smoker  . Smokeless tobacco: Never Used  Substance and Sexual Activity  . Alcohol use: Never  . Drug use: Never  . Sexual activity: Not Currently  Other Topics Concern  . 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 Difficulty of Paying Living Expenses:   Food  Insecurity:   . Worried About Programme researcher, broadcasting/film/video in the Last Year:   . Barista in the Last Year:   Transportation Needs:   . Freight forwarder (Medical):   Marland Kitchen Lack of Transportation (Non-Medical):   Physical Activity:   . Days of Exercise per Week:   . Minutes of Exercise per Session:   Stress:   . Feeling of Stress :   Social Connections:   . Frequency of Communication with Friends and Family:   . Frequency of Social Gatherings with Friends and Family:   . Attends Religious Services:   . Active Member of Clubs or Organizations:   . Attends Banker Meetings:   Marland Kitchen Marital Status:     Outpatient Encounter Medications as of  04/09/2019  Medication Sig  . aspirin EC 81 MG tablet Take 81 mg by mouth daily.  Marland Kitchen ibuprofen (ADVIL) 200 MG tablet Take 600 mg by mouth every 6 (six) hours as needed.  . latanoprost (XALATAN) 0.005 % ophthalmic solution 1 drop at bedtime.  . Melatonin 10 MG SUBL Place 1 tablet under the tongue at bedtime.  . naproxen sodium (ALEVE) 220 MG tablet Take 220 mg by mouth daily as needed.  . SYSTANE ULTRA 0.4-0.3 % SOLN   . Vitamin D, Ergocalciferol, (DRISDOL) 1.25 MG (50000 UNIT) CAPS capsule Take 50,000 Units by mouth once a week.   No facility-administered encounter medications on file as of 04/09/2019.    Activities of Daily Living In your present state of health, do you have any difficulty performing the following activities: 04/09/2019 01/20/2019  Hearing? N Y  Comment - Rt ear  Vision? N N  Difficulty concentrating or making decisions? N N  Walking or climbing stairs? N Y  Dressing or bathing? N N  Doing errands, shopping? N N  Preparing Food and eating ? N -  Using the Toilet? N -  In the past six months, have you accidently leaked urine? N -  Do you have problems with loss of bowel control? N -  Managing your Medications? N -  Managing your Finances? N -  Housekeeping or managing your Housekeeping? N -    Patient Care Team: [name], [place] as PCP - General (Physician Assistant) [name] Li, MD as Consulting Physician (Ophthalmology) [name], DPM as Consulting Physician (Podiatry)    Assessment:   This is a routine wellness examination for [name].  Exercise Activities and Dietary recommendations Current Exercise Habits: The patient does not participate in regular exercise at present, Exercise limited by: orthopedic condition(s)  Goals   None     Fall Risk Fall Risk  04/09/2019  Falls in the past year? 0  Number falls in past yr: 0  Injury with Fall? 0  Risk for fall due to : Orthopedic patient  Follow up Education provided;Falls prevention  discussed;Falls evaluation completed   Is the patient's home free of loose throw rugs in walkways, pet beds, electrical cords, etc?   yes      Grab bars in the bathroom? yes      Handrails on the stairs?   yes      Adequate lighting?   yes  Depression Screen PHQ 2/9 Scores 04/09/2019 01/20/2019  PHQ - 2 Score 0 0     Cognitive Function   6CIT Screen 04/09/2019  What Year? 0 points  What month? 0 points  What time? 0 points  Count back from 20 0 points  Months  in reverse 0 points  Repeat phrase 0 points  Total Score 0     There is no immunization history on file for this patient.  Qualifies for Shingles Vaccine? Discussed and patient will check with pharmacy for coverage.  Patient education handout provided   Screening Tests Health Maintenance  Topic Date Due  . Hepatitis C Screening  Never done  . MAMMOGRAM  Never done  . COLONOSCOPY  Never done  . INFLUENZA VACCINE  04/15/2019 (Originally 08/16/2018)  . TETANUS/TDAP  01/20/2020 (Originally [birth year]1964)  . PNA vac Low Risk Adult (1 of 2 - PCV13) 01/20/2020 (Originally 06/18/2010)  . DEXA SCAN  Completed    Cancer Screenings: Lung: Low Dose CT Chest recommended if Age 53-80 years, 30 pack-year currently smoking OR have quit w/in 15years. Patient does not qualify. Breast:  Up to date on Mammogram? Requesting records  Up to date of Bone Density/Dexa? Yes  Colorectal: requesting records     Plan:  I have personally reviewed and addressed the Medicare Annual Wellness questionnaire and have noted the following in the patient's chart:  A. Medical and social history B. Use of alcohol, tobacco or illicit drugs  C. Current medications and supplements D. Functional ability and status E.  Nutritional status F.  Physical activity G. Advance directives H. List of other physicians I.  Hospitalizations, surgeries, and ER visits in previous 12 months J.  Vitals K. Screenings such as hearing and vision if needed, cognitive and  depression L. Referrals, records requested, and appointments- requesting records from previous pcp  In addition, I have reviewed and discussed with patient certain preventive protocols, quality metrics, and best practice recommendations. A written personalized care plan for preventive services as well as general preventive health recommendations were provided to patient.   Signed,  Kandis Fantasia, LPN  Nurse Health Advisor   Nurse Notes: Patient will come by office to sign release for records

## 2019-04-09 NOTE — Patient Instructions (Signed)
[name] , Thank you for taking time to come for your Medicare Wellness Visit. I appreciate your ongoing commitment to your health goals. Please review the following plan we discussed and let me know if I can assist you in the future.   Screening recommendations/referrals: Colorectal Screening: we will obtain records  Mammogram: we will obtain records  Bone Density: we will obtain records   Vision and Dental Exams: Recommended annual ophthalmology exams for early detection of glaucoma and other disorders of the eye Recommended annual dental exams for proper oral hygiene   Vaccinations: Influenza vaccine: we will obtain records  Pneumococcal vaccine: we will obtain records  Tdap vaccine: we will obtain records  Shingles vaccine:  You may receive this vaccine at your local pharmacy. (see handout)   Advanced directives: Please bring a copy of your POA (Power of Attorney) and/or Living Will to your next appointment.  Goals: Recommend to drink at least 6-8 8oz glasses of water per day and consume a balanced diet rich in fresh fruits and vegetables.   Next appointment: Please schedule your Annual Wellness Visit with your Nurse Health Advisor in one year.  Preventive Care 74 Years and Older, Female Preventive care refers to lifestyle choices and visits with your health care provider that can promote health and wellness. What does preventive care include?  A yearly physical exam. This is also called an annual well check.  Dental exams once or twice a year.  Routine eye exams. Ask your health care provider how often you should have your eyes checked.  Personal lifestyle choices, including:  Daily care of your teeth and gums.  Regular physical activity.  Eating a healthy diet.  Avoiding tobacco and drug use.  Limiting alcohol use.  Practicing safe sex.  Taking low-dose aspirin every day if recommended by your health care provider.  Taking vitamin and mineral supplements as  recommended by your health care provider. What happens during an annual well check? The services and screenings done by your health care provider during your annual well check will depend on your age, overall health, lifestyle risk factors, and family history of disease. Counseling  Your health care provider may ask you questions about your:  Alcohol use.  Tobacco use.  Drug use.  Emotional well-being.  Home and relationship well-being.  Sexual activity.  Eating habits.  History of falls.  Memory and ability to understand (cognition).  Work and work Astronomer.  Reproductive health. Screening  You may have the following tests or measurements:  Height, weight, and BMI.  Blood pressure.  Lipid and cholesterol levels. These may be checked every 5 years, or more frequently if you are over 74 years old.  Skin check.  Lung cancer screening. You may have this screening every year starting at age 16 if you have a 30-pack-year history of smoking and currently smoke or have quit within the past 15 years.  Fecal occult blood test (FOBT) of the stool. You may have this test every year starting at age 55.  Flexible sigmoidoscopy or colonoscopy. You may have a sigmoidoscopy every 5 years or a colonoscopy every 10 years starting at age 74.  Hepatitis C blood test.  Hepatitis B blood test.  Sexually transmitted disease (STD) testing.  Diabetes screening. This is done by checking your blood sugar (glucose) after you have not eaten for a while (fasting). You may have this done every 1-3 years.  Bone density scan. This is done to screen for osteoporosis. You may have  this done starting at age 74.  Mammogram. This may be done every 1-2 years. Talk to your health care provider about how often you should have regular mammograms. Talk with your health care provider about your test results, treatment options, and if necessary, the need for more tests. Vaccines  Your health care  provider may recommend certain vaccines, such as:  Influenza vaccine. This is recommended every year.  Tetanus, diphtheria, and acellular pertussis (Tdap, Td) vaccine. You may need a Td booster every 10 years.  Zoster vaccine. You may need this after age 74.  Pneumococcal 13-valent conjugate (PCV13) vaccine. One dose is recommended after age 74.  Pneumococcal polysaccharide (PPSV23) vaccine. One dose is recommended after age 74. Talk to your health care provider about which screenings and vaccines you need and how often you need them. This information is not intended to replace advice given to you by your health care provider. Make sure you discuss any questions you have with your health care provider. Document Released: 01/28/2015 Document Revised: 09/21/2015 Document Reviewed: 11/02/2014 Elsevier Interactive Patient Education  2017 [employer] Prevention in the Home Falls can cause injuries. They can happen to people of all ages. There are many things you can do to make your home safe and to help prevent falls. What can I do on the outside of my home?  Regularly fix the edges of walkways and driveways and fix any cracks.  Remove anything that might make you trip as you walk through a door, such as a raised step or threshold.  Trim any bushes or trees on the path to your home.  Use bright outdoor lighting.  Clear any walking paths of anything that might make someone trip, such as rocks or tools.  Regularly check to see if handrails are loose or broken. Make sure that both sides of any steps have handrails.  Any raised decks and porches should have guardrails on the edges.  Have any leaves, snow, or ice cleared regularly.  Use sand or salt on walking paths during winter.  Clean up any spills in your garage right away. This includes oil or grease spills. What can I do in the bathroom?  Use night lights.  Install grab bars by the toilet and in the tub and shower. Do  not use towel bars as grab bars.  Use non-skid mats or decals in the tub or shower.  If you need to sit down in the shower, use a plastic, non-slip stool.  Keep the floor dry. Clean up any water that spills on the floor as soon as it happens.  Remove soap buildup in the tub or shower regularly.  Attach bath mats securely with double-sided non-slip rug tape.  Do not have throw rugs and other things on the floor that can make you trip. What can I do in the bedroom?  Use night lights.  Make sure that you have a light by your bed that is easy to reach.  Do not use any sheets or blankets that are too big for your bed. They should not hang down onto the floor.  Have a firm chair that has side arms. You can use this for support while you get dressed.  Do not have throw rugs and other things on the floor that can make you trip. What can I do in the kitchen?  Clean up any spills right away.  Avoid walking on wet floors.  Keep items that you use a lot in easy-to-reach  places.  If you need to reach something above you, use a strong step stool that has a grab bar.  Keep electrical cords out of the way.  Do not use floor polish or wax that makes floors slippery. If you must use wax, use non-skid floor wax.  Do not have throw rugs and other things on the floor that can make you trip. What can I do with my stairs?  Do not leave any items on the stairs.  Make sure that there are handrails on both sides of the stairs and use them. Fix handrails that are broken or loose. Make sure that handrails are as long as the stairways.  Check any carpeting to make sure that it is firmly attached to the stairs. Fix any carpet that is loose or worn.  Avoid having throw rugs at the top or bottom of the stairs. If you do have throw rugs, attach them to the floor with carpet tape.  Make sure that you have a light switch at the top of the stairs and the bottom of the stairs. If you do not have them,  ask someone to add them for you. What else can I do to help prevent falls?  Wear shoes that:  Do not have high heels.  Have rubber bottoms.  Are comfortable and fit you well.  Are closed at the toe. Do not wear sandals.  If you use a stepladder:  Make sure that it is fully opened. Do not climb a closed stepladder.  Make sure that both sides of the stepladder are locked into place.  Ask someone to hold it for you, if possible.  Clearly mark and make sure that you can see:  Any grab bars or handrails.  First and last steps.  Where the edge of each step is.  Use tools that help you move around (mobility aids) if they are needed. These include:  Canes.  Walkers.  Scooters.  Crutches.  Turn on the lights when you go into a dark area. Replace any light bulbs as soon as they burn out.  Set up your furniture so you have a clear path. Avoid moving your furniture around.  If any of your floors are uneven, fix them.  If there are any pets around you, be aware of where they are.  Review your medicines with your doctor. Some medicines can make you feel dizzy. This can increase your chance of falling. Ask your doctor what other things that you can do to help prevent falls. This information is not intended to replace advice given to you by your health care provider. Make sure you discuss any questions you have with your health care provider. Document Released: 10/28/2008 Document Revised: 06/09/2015 Document Reviewed: 02/05/2014 Elsevier Interactive Patient Education  2017 [employer].

## 2019-05-04 ENCOUNTER — Encounter (HOSPITAL_COMMUNITY): Payer: Medicare Other

## 2019-05-05 ENCOUNTER — Other Ambulatory Visit (HOSPITAL_COMMUNITY): Payer: Self-pay | Admitting: *Deleted

## 2019-05-06 ENCOUNTER — Ambulatory Visit (HOSPITAL_COMMUNITY)
Admission: RE | Admit: 2019-05-06 | Discharge: 2019-05-06 | Disposition: A | Discharge status: Home / Self Care | Payer: Medicare Other | Source: Ambulatory Visit | Attending: Sports Medicine | Admitting: Sports Medicine

## 2019-05-06 ENCOUNTER — Other Ambulatory Visit: Payer: Self-pay

## 2019-05-06 DIAGNOSIS — M810 Age-related osteoporosis without current pathological fracture: Secondary | ICD-10-CM | POA: Insufficient documentation

## 2019-05-06 MED ORDER — ROMOSOZUMAB-AQQG 105 MG/1.17ML ~~LOC~~ SOSY
210.0000 mg | PREFILLED_SYRINGE | Freq: Once | SUBCUTANEOUS | Status: DC
  Administered 2019-05-06: 210 mg via SUBCUTANEOUS
  Filled 2019-05-06: qty 2.4

## 2019-05-06 NOTE — Discharge Instructions (Signed)
Romosozumab injection °What is this medicine? °ROMOSOZUMAB (roe moe SOZ ue mab) increases bone formation. It is used to treat osteoporosis in women. °This medicine may be used for other purposes; ask your health care provider or pharmacist if you have questions. °COMMON BRAND NAME(S): EVENITY °What should I tell my health care provider before I take this medicine? °They need to know if you have any of these conditions: °· dental disease or wear dentures °· heart disease °· history of stroke °· kidney disease °· low levels of calcium in the blood °· an unusual or allergic reaction to romosozumab, other medicines, foods, dyes or preservatives °· pregnant or trying to get pregnant °· breast-feeding °How should I use this medicine? °This medicine is for injection under the skin. It is given by a healthcare professional in a hospital or clinic setting. °Talk to your pediatrician about the use of this medicine in children. Special care may be needed. °Overdosage: If you think you have taken too much of this medicine contact a poison control center or emergency room at once. °NOTE: This medicine is only for you. Do not share this medicine with others. °What if I miss a dose? °Keep appointments for follow-up doses. It is important not to miss your dose. Call your doctor or healthcare professional if you are unable to keep an appointment. °What may interact with this medicine? °Interactions are not expected. °This list may not describe all possible interactions. Give your health care provider a list of all the medicines, herbs, non-prescription drugs, or dietary supplements you use. Also tell them if you smoke, drink alcohol, or use illegal drugs. Some items may interact with your medicine. °What should I watch for while using this medicine? °Your condition will be monitored carefully while you are receiving this medicine. °You may need blood work done while you are taking this medicine. °Some people who take this medicine  have severe bone, joint, or muscle pain. This medicine may also increase your risk for jaw problems or a broken thigh bone. Tell your healthcare professional right away if you have severe pain in your jaw, bones, joints, or muscles. Tell your healthcare professional if you have any pain that does not go away or that gets worse. °You should make sure you get enough calcium and vitamin D while you are taking this medicine. Discuss the foods you eat and the vitamins you take with your healthcare professional. °Tell your dentist and dental surgeon that you are taking this medicine. You should not have major dental surgery while on this medicine. See your dentist to have a dental exam and fix any dental problems before starting this medicine. Take good care of your teeth while on this medicine. Make sure you see your dentist for regular follow-up appointments. °What side effects may I notice from receiving this medicine? °Side effects that you should report to your doctor or health care professional as soon as possible: °· allergic reactions like skin rash, itching or hives, swelling of the face, lips, or tongue °· bone pain °· chest pain or chest tightness °· jaw pain, especially after dental work °· signs and symptoms of a stroke like changes in vision; confusion; trouble speaking or understanding; severe headaches; sudden numbness or weakness of the face, arm or leg; trouble walking; dizziness; loss of balance or coordination °· signs and symptoms of low calcium like fast heartbeat; muscle cramps; muscle pain; pain, tingling, numbness in the hands or feet; seizures °Side effects that usually do not require   medical attention (report these to your doctor or health care professional if they continue or are bothersome): °· headache °· joint pain °· pain, redness, or irritation at site where injected °· pain, tingling, numbness in the hands or feet °· swelling of ankles, feet, hands °· trouble sleeping °This list may not  describe all possible side effects. Call your doctor for medical advice about side effects. You may report side effects to FDA at 1-800-FDA-1088. °Where should I keep my medicine? °This medicine is given in a hospital or clinic and will not be stored at home. °NOTE: This sheet is a summary. It may not cover all possible information. If you have questions about this medicine, talk to your doctor, pharmacist, or health care provider. °© 2020 Elsevier/Gold Standard (2017-04-25 01:15:32) ° °

## 2019-05-11 ENCOUNTER — Encounter: Payer: Self-pay | Admitting: Podiatry

## 2019-05-11 ENCOUNTER — Ambulatory Visit (INDEPENDENT_AMBULATORY_CARE_PROVIDER_SITE_OTHER): Payer: Medicare Other | Admitting: Podiatry

## 2019-05-11 ENCOUNTER — Ambulatory Visit (INDEPENDENT_AMBULATORY_CARE_PROVIDER_SITE_OTHER): Payer: Medicare Other

## 2019-05-11 ENCOUNTER — Other Ambulatory Visit: Payer: Self-pay

## 2019-05-11 DIAGNOSIS — M79675 Pain in left toe(s): Secondary | ICD-10-CM

## 2019-05-11 DIAGNOSIS — B351 Tinea unguium: Secondary | ICD-10-CM

## 2019-05-11 DIAGNOSIS — M79674 Pain in right toe(s): Secondary | ICD-10-CM | POA: Diagnosis not present

## 2019-05-11 DIAGNOSIS — M2011 Hallux valgus (acquired), right foot: Secondary | ICD-10-CM

## 2019-05-11 NOTE — Progress Notes (Signed)
Subjective:   Patient ID: [name], female   DOB: 74 y.o.   MRN: [id number]   HPI Patient states overall doing well and states having minimal discomfort and very pleased with results   ROS      Objective:  Physical Exam  Neurovascular status intact with patient's right foot healing well wound edges well coapted hallux in rectus position good range of motion second and third digits in good position     Assessment:  Overall doing well foot surgery right     Plan:  Reviewed the continuation of physical therapy continuation of range of motion exercises and soft shoe and patient is discharged will be seen back as needed  X-rays indicate osteotomies healing well alignment good toes in good position

## 2019-06-03 ENCOUNTER — Ambulatory Visit (HOSPITAL_COMMUNITY)
Admission: RE | Admit: 2019-06-03 | Discharge: 2019-06-03 | Disposition: A | Discharge status: Home / Self Care | Payer: Medicare Other | Source: Ambulatory Visit | Attending: Sports Medicine | Admitting: Sports Medicine

## 2019-06-03 ENCOUNTER — Other Ambulatory Visit: Payer: Self-pay

## 2019-06-03 DIAGNOSIS — M810 Age-related osteoporosis without current pathological fracture: Secondary | ICD-10-CM | POA: Diagnosis present

## 2019-06-03 MED ORDER — ROMOSOZUMAB-AQQG 105 MG/1.17ML ~~LOC~~ SOSY
210.0000 mg | PREFILLED_SYRINGE | Freq: Once | SUBCUTANEOUS | Status: DC
  Administered 2019-06-03: 210 mg via SUBCUTANEOUS
  Filled 2019-06-03: qty 2.4

## 2019-07-01 ENCOUNTER — Other Ambulatory Visit: Payer: Self-pay

## 2019-07-01 ENCOUNTER — Encounter: Payer: Self-pay | Admitting: Podiatry

## 2019-07-01 ENCOUNTER — Ambulatory Visit (INDEPENDENT_AMBULATORY_CARE_PROVIDER_SITE_OTHER): Payer: Medicare Other | Admitting: Podiatry

## 2019-07-01 ENCOUNTER — Encounter (HOSPITAL_COMMUNITY)
Admission: RE | Admit: 2019-07-01 | Discharge: 2019-07-01 | Disposition: A | Discharge status: Home / Self Care | Payer: Medicare Other | Source: Ambulatory Visit | Attending: Sports Medicine | Admitting: Sports Medicine

## 2019-07-01 DIAGNOSIS — M79674 Pain in right toe(s): Secondary | ICD-10-CM

## 2019-07-01 DIAGNOSIS — M79675 Pain in left toe(s): Secondary | ICD-10-CM

## 2019-07-01 DIAGNOSIS — B351 Tinea unguium: Secondary | ICD-10-CM

## 2019-07-01 DIAGNOSIS — M810 Age-related osteoporosis without current pathological fracture: Secondary | ICD-10-CM | POA: Insufficient documentation

## 2019-07-01 DIAGNOSIS — M2011 Hallux valgus (acquired), right foot: Secondary | ICD-10-CM

## 2019-07-01 DIAGNOSIS — M2042 Other hammer toe(s) (acquired), left foot: Secondary | ICD-10-CM

## 2019-07-01 DIAGNOSIS — M2041 Other hammer toe(s) (acquired), right foot: Secondary | ICD-10-CM

## 2019-07-01 DIAGNOSIS — M2010 Hallux valgus (acquired), unspecified foot: Secondary | ICD-10-CM | POA: Insufficient documentation

## 2019-07-01 DIAGNOSIS — M2012 Hallux valgus (acquired), left foot: Secondary | ICD-10-CM

## 2019-07-01 MED ORDER — ROMOSOZUMAB-AQQG 105 MG/1.17ML ~~LOC~~ SOSY
210.0000 mg | PREFILLED_SYRINGE | Freq: Once | SUBCUTANEOUS | Status: DC
  Administered 2019-07-01: 210 mg via SUBCUTANEOUS
  Filled 2019-07-01: qty 2.4

## 2019-07-01 NOTE — Progress Notes (Signed)
This patient returns to the office for evaluation and treatment of long thick painful nails .  This patient is unable to trim his own nails since the patient cannot reach her feet.  Patient says the nails are painful walking and wearing her shoes.  She  returns for preventive foot care services.  General Appearance  Alert, conversant and in no acute stress.  Vascular  Dorsalis pedis and posterior tibial  pulses are palpable  bilaterally.  Capillary return is within normal limits  bilaterally. Temperature is within normal limits  bilaterally.  Neurologic  Senn-Weinstein monofilament wire test within normal limits  bilaterally. Muscle power within normal limits bilaterally.  Nails Thick disfigured discolored nails with subungual debris  Second and third toes right foot.   No evidence of bacterial infection or drainage bilaterally.  Orthopedic  No limitations of motion  feet .  No crepitus or effusions noted.  No bony pathology or digital deformities noted.  HAV  B/L.     Skin  normotropic skin with no porokeratosis noted bilaterally.  No signs of infections or ulcers noted.     Onychomycosis  Pain in toes right foot    Debridement  of nails  1-5  B/L with a nail nipper.  Nails were then filed using a dremel tool with no incidents.    RTC 3 months   Helane Gunther DPM

## 2019-07-31 ENCOUNTER — Encounter (HOSPITAL_COMMUNITY): Payer: Medicare Other

## 2019-08-05 ENCOUNTER — Other Ambulatory Visit: Payer: Self-pay

## 2019-08-05 ENCOUNTER — Ambulatory Visit (HOSPITAL_COMMUNITY)
Admission: RE | Admit: 2019-08-05 | Discharge: 2019-08-05 | Disposition: A | Discharge status: Home / Self Care | Payer: Medicare Other | Source: Ambulatory Visit | Attending: Sports Medicine | Admitting: Sports Medicine

## 2019-08-05 DIAGNOSIS — M810 Age-related osteoporosis without current pathological fracture: Secondary | ICD-10-CM | POA: Diagnosis not present

## 2019-08-05 MED ORDER — ROMOSOZUMAB-AQQG 105 MG/1.17ML ~~LOC~~ SOSY
210.0000 mg | PREFILLED_SYRINGE | Freq: Once | SUBCUTANEOUS | Status: DC
  Administered 2019-08-05: 210 mg via SUBCUTANEOUS
  Filled 2019-08-05: qty 2.4

## 2019-09-02 ENCOUNTER — Ambulatory Visit (HOSPITAL_COMMUNITY)
Admission: RE | Admit: 2019-09-02 | Discharge: 2019-09-02 | Disposition: A | Discharge status: Home / Self Care | Payer: Medicare Other | Source: Ambulatory Visit | Attending: Sports Medicine | Admitting: Sports Medicine

## 2019-09-02 ENCOUNTER — Other Ambulatory Visit: Payer: Self-pay

## 2019-09-02 DIAGNOSIS — M810 Age-related osteoporosis without current pathological fracture: Secondary | ICD-10-CM | POA: Diagnosis not present

## 2019-09-02 MED ORDER — ROMOSOZUMAB-AQQG 105 MG/1.17ML ~~LOC~~ SOSY
210.0000 mg | PREFILLED_SYRINGE | Freq: Once | SUBCUTANEOUS | Status: DC
  Administered 2019-09-02: 210 mg via SUBCUTANEOUS
  Filled 2019-09-02: qty 2.4

## 2019-09-30 ENCOUNTER — Ambulatory Visit (HOSPITAL_COMMUNITY)
Admission: RE | Admit: 2019-09-30 | Discharge: 2019-09-30 | Disposition: A | Discharge status: Home / Self Care | Payer: Medicare Other | Source: Ambulatory Visit | Attending: Sports Medicine | Admitting: Sports Medicine

## 2019-09-30 ENCOUNTER — Other Ambulatory Visit: Payer: Self-pay

## 2019-09-30 DIAGNOSIS — M2011 Hallux valgus (acquired), right foot: Secondary | ICD-10-CM | POA: Diagnosis present

## 2019-09-30 MED ORDER — ROMOSOZUMAB-AQQG 105 MG/1.17ML ~~LOC~~ SOSY
210.0000 mg | PREFILLED_SYRINGE | Freq: Once | SUBCUTANEOUS | Status: DC
  Administered 2019-09-30: 210 mg via SUBCUTANEOUS
  Filled 2019-09-30: qty 2.4

## 2019-10-06 ENCOUNTER — Other Ambulatory Visit: Payer: Self-pay

## 2019-10-06 ENCOUNTER — Encounter: Payer: Self-pay | Admitting: Podiatry

## 2019-10-06 ENCOUNTER — Ambulatory Visit (INDEPENDENT_AMBULATORY_CARE_PROVIDER_SITE_OTHER): Payer: Medicare Other | Admitting: Podiatry

## 2019-10-06 DIAGNOSIS — M79674 Pain in right toe(s): Secondary | ICD-10-CM

## 2019-10-06 DIAGNOSIS — M2012 Hallux valgus (acquired), left foot: Secondary | ICD-10-CM | POA: Diagnosis not present

## 2019-10-06 DIAGNOSIS — M2011 Hallux valgus (acquired), right foot: Secondary | ICD-10-CM | POA: Diagnosis not present

## 2019-10-06 DIAGNOSIS — M79675 Pain in left toe(s): Secondary | ICD-10-CM | POA: Diagnosis not present

## 2019-10-06 DIAGNOSIS — B351 Tinea unguium: Secondary | ICD-10-CM

## 2019-10-06 NOTE — Progress Notes (Signed)
This patient returns to the office for evaluation and treatment of long thick painful nails .  This patient is unable to trim his own nails since the patient cannot reach her feet.  Patient says the nails are painful walking and wearing her shoes.  She  returns for preventive foot care services.  General Appearance  Alert, conversant and in no acute stress.  Vascular  Dorsalis pedis and posterior tibial  pulses are palpable  bilaterally.  Capillary return is within normal limits  bilaterally. Temperature is within normal limits  bilaterally.  Neurologic  Senn-Weinstein monofilament wire test within normal limits  bilaterally. Muscle power within normal limits bilaterally.  Nails Thick disfigured discolored nails with subungual debris  Second and third toes right foot.   No evidence of bacterial infection or drainage bilaterally.  Orthopedic  No limitations of motion  feet .  No crepitus or effusions noted.  No bony pathology or digital deformities noted.  HAV  B/L.     Skin  normotropic skin with no porokeratosis noted bilaterally.  No signs of infections or ulcers noted.   Pinch callus left hallux.  Onychomycosis  Pain in toes right foot    Debridement  of nails  1-5  B/L with a nail nipper.  Nails were then filed using a dremel tool with no incidents.    RTC 3 months   Helane Gunther DPM

## 2019-10-28 ENCOUNTER — Ambulatory Visit (INDEPENDENT_AMBULATORY_CARE_PROVIDER_SITE_OTHER): Payer: Medicare Other

## 2019-10-28 ENCOUNTER — Other Ambulatory Visit: Payer: Self-pay

## 2019-10-28 ENCOUNTER — Ambulatory Visit (HOSPITAL_COMMUNITY)
Admission: RE | Admit: 2019-10-28 | Discharge: 2019-10-28 | Disposition: A | Discharge status: Home / Self Care | Payer: Medicare Other | Source: Ambulatory Visit | Attending: Sports Medicine | Admitting: Sports Medicine

## 2019-10-28 DIAGNOSIS — M810 Age-related osteoporosis without current pathological fracture: Secondary | ICD-10-CM | POA: Diagnosis not present

## 2019-10-28 DIAGNOSIS — Z23 Encounter for immunization: Secondary | ICD-10-CM

## 2019-10-28 MED ORDER — ROMOSOZUMAB-AQQG 105 MG/1.17ML ~~LOC~~ SOSY
210.0000 mg | PREFILLED_SYRINGE | Freq: Once | SUBCUTANEOUS | Status: DC
  Administered 2019-10-28: 210 mg via SUBCUTANEOUS
  Filled 2019-10-28: qty 2.4

## 2019-11-25 ENCOUNTER — Ambulatory Visit (HOSPITAL_COMMUNITY)
Admission: RE | Admit: 2019-11-25 | Discharge: 2019-11-25 | Disposition: A | Discharge status: Home / Self Care | Payer: Medicare Other | Source: Ambulatory Visit | Attending: Sports Medicine | Admitting: Sports Medicine

## 2019-11-25 ENCOUNTER — Other Ambulatory Visit: Payer: Self-pay

## 2019-11-25 DIAGNOSIS — M810 Age-related osteoporosis without current pathological fracture: Secondary | ICD-10-CM | POA: Diagnosis not present

## 2019-11-25 MED ORDER — ROMOSOZUMAB-AQQG 105 MG/1.17ML ~~LOC~~ SOSY
210.0000 mg | PREFILLED_SYRINGE | Freq: Once | SUBCUTANEOUS | Status: AC
  Administered 2019-11-25: 210 mg via SUBCUTANEOUS
  Filled 2019-11-25: qty 2.4

## 2019-12-22 ENCOUNTER — Other Ambulatory Visit (HOSPITAL_COMMUNITY): Payer: Self-pay

## 2019-12-23 ENCOUNTER — Other Ambulatory Visit: Payer: Self-pay

## 2019-12-23 ENCOUNTER — Ambulatory Visit (HOSPITAL_COMMUNITY)
Admission: RE | Admit: 2019-12-23 | Discharge: 2019-12-23 | Disposition: A | Discharge status: Home / Self Care | Payer: Medicare Other | Source: Ambulatory Visit | Attending: Sports Medicine | Admitting: Sports Medicine

## 2019-12-23 DIAGNOSIS — M810 Age-related osteoporosis without current pathological fracture: Secondary | ICD-10-CM | POA: Diagnosis not present

## 2019-12-23 MED ORDER — ROMOSOZUMAB-AQQG 105 MG/1.17ML ~~LOC~~ SOSY
210.0000 mg | PREFILLED_SYRINGE | Freq: Once | SUBCUTANEOUS | Status: DC
  Administered 2019-12-23: 210 mg via SUBCUTANEOUS
  Filled 2019-12-23: qty 2.4

## 2020-01-05 ENCOUNTER — Ambulatory Visit: Payer: Medicare Other | Admitting: Podiatry

## 2020-01-20 ENCOUNTER — Other Ambulatory Visit: Payer: Self-pay

## 2020-01-20 ENCOUNTER — Ambulatory Visit (HOSPITAL_COMMUNITY)
Admission: RE | Admit: 2020-01-20 | Discharge: 2020-01-20 | Disposition: A | Discharge status: Home / Self Care | Payer: Medicare Other | Source: Ambulatory Visit | Attending: Sports Medicine | Admitting: Sports Medicine

## 2020-01-20 DIAGNOSIS — M810 Age-related osteoporosis without current pathological fracture: Secondary | ICD-10-CM | POA: Insufficient documentation

## 2020-01-20 MED ORDER — ROMOSOZUMAB-AQQG 105 MG/1.17ML ~~LOC~~ SOSY
210.0000 mg | PREFILLED_SYRINGE | Freq: Once | SUBCUTANEOUS | Status: DC
  Administered 2020-01-20: 210 mg via SUBCUTANEOUS
  Filled 2020-01-20: qty 2.4

## 2020-02-17 ENCOUNTER — Encounter (HOSPITAL_COMMUNITY): Payer: Medicare Other

## 2020-02-22 ENCOUNTER — Inpatient Hospital Stay (HOSPITAL_COMMUNITY): Admission: RE | Admit: 2020-02-22 | Payer: Medicare Other | Source: Ambulatory Visit

## 2020-02-25 ENCOUNTER — Ambulatory Visit (HOSPITAL_COMMUNITY)
Admission: RE | Admit: 2020-02-25 | Discharge: 2020-02-25 | Disposition: A | Discharge status: Home / Self Care | Payer: Medicare Other | Source: Ambulatory Visit | Attending: Sports Medicine | Admitting: Sports Medicine

## 2020-02-25 DIAGNOSIS — M810 Age-related osteoporosis without current pathological fracture: Secondary | ICD-10-CM | POA: Diagnosis present

## 2020-02-25 MED ORDER — ROMOSOZUMAB-AQQG 105 MG/1.17ML ~~LOC~~ SOSY
210.0000 mg | PREFILLED_SYRINGE | Freq: Once | SUBCUTANEOUS | Status: DC
  Administered 2020-02-25: 210 mg via SUBCUTANEOUS
  Filled 2020-02-25: qty 2.4

## 2020-03-21 ENCOUNTER — Encounter (HOSPITAL_COMMUNITY): Payer: Medicare Other

## 2020-04-01 ENCOUNTER — Other Ambulatory Visit: Payer: Self-pay

## 2020-04-01 ENCOUNTER — Ambulatory Visit (HOSPITAL_COMMUNITY)
Admission: RE | Admit: 2020-04-01 | Discharge: 2020-04-01 | Disposition: A | Discharge status: Home / Self Care | Payer: Medicare Other | Source: Ambulatory Visit | Attending: Sports Medicine | Admitting: Sports Medicine

## 2020-04-01 DIAGNOSIS — M810 Age-related osteoporosis without current pathological fracture: Secondary | ICD-10-CM | POA: Insufficient documentation

## 2020-04-01 MED ORDER — ROMOSOZUMAB-AQQG 105 MG/1.17ML ~~LOC~~ SOSY
210.0000 mg | PREFILLED_SYRINGE | Freq: Once | SUBCUTANEOUS | Status: DC
  Administered 2020-04-01: 210 mg via SUBCUTANEOUS
  Filled 2020-04-01: qty 2.4

## 2020-04-26 ENCOUNTER — Encounter: Payer: Self-pay | Admitting: Physician Assistant

## 2020-04-29 ENCOUNTER — Other Ambulatory Visit: Payer: Self-pay

## 2020-04-29 ENCOUNTER — Ambulatory Visit (HOSPITAL_COMMUNITY)
Admission: RE | Admit: 2020-04-29 | Discharge: 2020-04-29 | Disposition: A | Discharge status: Home / Self Care | Payer: Medicare Other | Source: Ambulatory Visit | Attending: Sports Medicine | Admitting: Sports Medicine

## 2020-04-29 DIAGNOSIS — M810 Age-related osteoporosis without current pathological fracture: Secondary | ICD-10-CM | POA: Insufficient documentation

## 2020-04-29 MED ORDER — ROMOSOZUMAB-AQQG 105 MG/1.17ML ~~LOC~~ SOSY
210.0000 mg | PREFILLED_SYRINGE | Freq: Once | SUBCUTANEOUS | Status: AC
  Administered 2020-04-29: 210 mg via SUBCUTANEOUS
  Filled 2020-04-29: qty 2.34

## 2020-05-27 ENCOUNTER — Encounter (HOSPITAL_COMMUNITY): Payer: Medicare Other

## 2020-05-30 ENCOUNTER — Other Ambulatory Visit: Payer: Self-pay

## 2020-05-30 ENCOUNTER — Telehealth (INDEPENDENT_AMBULATORY_CARE_PROVIDER_SITE_OTHER): Payer: Medicare Other | Admitting: Medical

## 2020-05-30 DIAGNOSIS — J040 Acute laryngitis: Secondary | ICD-10-CM | POA: Diagnosis not present

## 2020-05-30 DIAGNOSIS — J029 Acute pharyngitis, unspecified: Secondary | ICD-10-CM | POA: Diagnosis not present

## 2020-05-30 DIAGNOSIS — J309 Allergic rhinitis, unspecified: Secondary | ICD-10-CM

## 2020-05-30 DIAGNOSIS — J3489 Other specified disorders of nose and nasal sinuses: Secondary | ICD-10-CM | POA: Diagnosis not present

## 2020-05-30 MED ORDER — AZITHROMYCIN 250 MG PO TABS
ORAL_TABLET | ORAL | 0 refills | Status: AC
Start: 2020-05-30 — End: 2020-06-04

## 2020-05-30 MED ORDER — LEVOCETIRIZINE DIHYDROCHLORIDE 5 MG PO TABS
5.0000 mg | ORAL_TABLET | Freq: Every evening | ORAL | 3 refills | Status: DC
Start: 2020-05-30 — End: 2021-02-06

## 2020-05-30 MED ORDER — FLUTICASONE PROPIONATE 50 MCG/ACT NA SUSP
2.0000 | Freq: Every day | NASAL | 1 refills | Status: DC
Start: 2020-05-30 — End: 2022-03-22

## 2020-05-30 MED ORDER — BENZONATATE 100 MG PO CAPS: 100.0000 mg | ORAL_CAPSULE | Freq: Three times a day (TID) | ORAL | 0 refills | Status: DC | PRN

## 2020-05-30 NOTE — Patient Instructions (Signed)
2 weeks of allergy type signs symptoms followed by recent sinus pressure, laryngitis and pharyngitis.  Rx azithromycin antibiotic, flonase nasal spray and benzonatate cough tabs.  Recommend to get rapid covid  test tomorrow and then later in the week as well if first test negative. Notify me if any  test comes back positive.  Follow up 7 days or as needed

## 2020-05-30 NOTE — Progress Notes (Signed)
   Subjective:    Patient ID: [name], female    DOB: 27-Aug-1945, 75 y.o.   MRN: [id number]  HPI  Virtual Visit via Telephone Note  I connected with [name] on 05/30/20 at  4:00 PM EDT by telephone and verified that I am speaking with the correct person using two identifiers.  Location: Patient: home Provider: office   I discussed the limitations, risks, security and privacy concerns of performing an evaluation and management service by telephone and the availability of in person appointments. I also discussed with the patient that there may be a patient responsible charge related to this service. The patient expressed understanding and agreed to proceed.  Pt did not check vitals.    History of Present Illness:  Pt states she has had allergy signs and symptoms for about 2 weeks. History of fall allergies as well. Some episodes of sneezing. Off and on nasal congestion. Mild sinus pressure. Pt will cough little but dry.     This morning has hoarse voice and st. Using echinacea as well. No fever, no chills, no sweat or body aches.  Pt never had covid vaccine. Pt never had covid before.  Pt was out in yard doing yard work.   Pt takes zyrtec every night(using for about 1.5 years). No nasal sprays used.      Observations/Objective: General- no acute distress, pleasant, alert and normal speech.   Assessment and Plan: 2 weeks of allergy type signs symptoms followed by recent sinus pressure, laryngitis and pharyngitis.  Rx azithromycin antibiotic, flonase nasal spray and benzonatate cough tabs.  Recommend to get rapid covid  test tomorrow and then later in the week as well if first test negative. Notify me if any  test comes back positive.  Follow up 7 days or as needed  Follow Up Instructions:    I discussed the assessment and treatment plan with the patient. The patient was provided an opportunity to ask questions and all were answered. The patient agreed with the  plan and demonstrated an understanding of the instructions.   The patient was advised to call back or seek an in-person evaluation if the symptoms worsen or if the condition fails to improve as anticipated.  Time spent with patient today was 25  minutes which consisted of chart revdiew, discussing diagnosis, work up treatment and documentation.   Esperanza Richters, PA-C    Review of Systems  Constitutional: Negative for chills, fatigue and fever.  HENT: Positive for congestion, sinus pressure and sore throat.   Respiratory: Positive for cough. Negative for chest tightness and wheezing.   Cardiovascular: Negative for chest pain and palpitations.  Gastrointestinal: Negative for abdominal pain.  Musculoskeletal: Negative for back pain.  Neurological: Negative for dizziness, numbness and headaches.  Hematological: Negative for adenopathy. Does not bruise/bleed easily.  Psychiatric/Behavioral: Negative for decreased concentration.       Objective:   Physical Exam        Assessment & Plan:

## 2020-05-31 ENCOUNTER — Encounter (HOSPITAL_COMMUNITY): Payer: Medicare Other

## 2020-06-28 ENCOUNTER — Encounter (HOSPITAL_COMMUNITY): Payer: Medicare Other

## 2020-06-29 ENCOUNTER — Other Ambulatory Visit (HOSPITAL_COMMUNITY): Payer: Self-pay | Admitting: *Deleted

## 2020-07-04 ENCOUNTER — Other Ambulatory Visit: Payer: Self-pay

## 2020-07-04 ENCOUNTER — Ambulatory Visit (HOSPITAL_COMMUNITY)
Admission: RE | Admit: 2020-07-04 | Discharge: 2020-07-04 | Disposition: A | Discharge status: Home / Self Care | Payer: Medicare Other | Source: Ambulatory Visit | Attending: Sports Medicine | Admitting: Sports Medicine

## 2020-07-04 DIAGNOSIS — M810 Age-related osteoporosis without current pathological fracture: Secondary | ICD-10-CM | POA: Insufficient documentation

## 2020-07-04 MED ORDER — ZOLEDRONIC ACID 5 MG/100ML IV SOLN: 5.0000 mg | Freq: Once | INTRAVENOUS | Status: AC

## 2020-07-04 MED ORDER — ZOLEDRONIC ACID 5 MG/100ML IV SOLN
INTRAVENOUS | Status: AC
  Administered 2020-07-04: 5 mg via INTRAVENOUS
  Filled 2020-07-04: qty 100

## 2020-10-07 ENCOUNTER — Telehealth (INDEPENDENT_AMBULATORY_CARE_PROVIDER_SITE_OTHER): Payer: Medicare Other | Admitting: Physician Assistant

## 2020-10-07 ENCOUNTER — Encounter: Payer: Self-pay | Admitting: Physician Assistant

## 2020-10-07 VITALS — Ht 68.0 in | Wt 190.0 lb

## 2020-10-07 DIAGNOSIS — U071 COVID-19: Secondary | ICD-10-CM

## 2020-10-07 MED ORDER — NIRMATRELVIR/RITONAVIR (PAXLOVID)TABLET: 3.0000 | ORAL_TABLET | Freq: Two times a day (BID) | ORAL | 0 refills | Status: AC

## 2020-10-07 NOTE — Patient Instructions (Signed)
Take the Paxlovid as directed. Go to the ED right away if any sudden chest pain, shortness of breath,  or sudden weakness.

## 2020-10-07 NOTE — Progress Notes (Signed)
Virtual Visit via Video Note  I connected with  [name]  on 10/07/20 at 11:30 AM EDT by a video enabled telemedicine application and verified that I am speaking with the correct person using two identifiers.  Location: Patient: home Provider: Nature conservation officer at [employer] Persons present: Patient and myself   I discussed the limitations of evaluation and management by telemedicine and the availability of in person appointments. The patient expressed understanding and agreed to proceed.   History of Present Illness: Chief complaint: COVID+ home test this morning Symptom onset: 10/06/20 evening  Pertinent positives: Congestion, headache, muscle aches, fever, fatigue, ST, cough Pertinent negatives: Chest pain, SOB Treatments tried: Tylenol, OTC cough syrup Vaccine status: No COVID vaccines  Sick exposure: Several in-person meetings this week, unsure of sick contacts    Observations/Objective:  Gen: Awake, alert, no acute distress Resp: Breathing is even and non-labored Psych: calm/pleasant demeanor Neuro: Alert and Oriented x 3, + facial symmetry, speech is clear.   Assessment and Plan:  1. COVID-19 Diagnosis confirmed via home antigen test. We discussed current algorithm recommendations for prescribing outpatient antivirals.  As the patient is over the age of 49, and does not have vaccine status, antiviral treatment is indicated at this time.  Risks versus benefits discussed.  We decided together that Paxlovid would be the best antiviral treatment option for her at this time.  She has good kidney function, she is still in the 5-day window of onset of symptoms, and she does not have any medications that interact with this medication.  She was advised of possible side effects.  Advised self-isolation at home for the next 5 days and then masking around others for at least an additional 5 days.  Treat supportively at this time as well including sleeping prone, deep  breathing exercises, pushing fluids, walking every few hours, vitamins C and D, and Tylenol or ibuprofen as needed.  The patient understands that COVID-19 illness can wax and wane.  Should the symptoms acutely worsen or patient starts to experience sudden shortness of breath, chest pain, severe weakness, the patient will go straight to the emergency department.  Also advised home pulse oximetry monitoring and for any reading consistently under 92%, should also report to the emergency department.  The patient will continue to keep [place] updated.   Follow Up Instructions:  Video connection was lost at <50% of the duration of the visit, at which time the remainder of the visit was completed via audio only.  I discussed the assessment and treatment plan with the patient. The patient was provided an opportunity to ask questions and all were answered. The patient agreed with the plan and demonstrated an understanding of the instructions.   The patient was advised to call back or seek an in-person evaluation if the symptoms worsen or if the condition fails to improve as anticipated.  Johann Gascoigne M Latresha Yahr, PA-C

## 2020-11-28 ENCOUNTER — Telehealth: Payer: Self-pay | Admitting: Physician Assistant

## 2020-11-28 NOTE — Telephone Encounter (Signed)
Copied from CRM [id number]. Topic: Medicare AWV >> Nov 28, 2020  9:36 AM Harris-Coley, Avon Gully wrote: Reason for CRM: Left message for patient to schedule Annual Wellness Visit.  Please schedule with Nurse Health Advisor Lanier Ensign, RN at [facility].  Please call [phone] ask for [facility]

## 2020-12-31 ENCOUNTER — Other Ambulatory Visit: Payer: Self-pay

## 2020-12-31 ENCOUNTER — Emergency Department (HOSPITAL_BASED_OUTPATIENT_CLINIC_OR_DEPARTMENT_OTHER)
Admission: EM | Admit: 2020-12-31 | Discharge: 2020-12-31 | Disposition: A | Discharge status: Home / Self Care | Payer: Medicare Other | Attending: Emergency Medicine | Admitting: Emergency Medicine

## 2020-12-31 ENCOUNTER — Encounter (HOSPITAL_BASED_OUTPATIENT_CLINIC_OR_DEPARTMENT_OTHER): Payer: Self-pay | Admitting: Emergency Medicine

## 2020-12-31 ENCOUNTER — Emergency Department (HOSPITAL_BASED_OUTPATIENT_CLINIC_OR_DEPARTMENT_OTHER): Payer: Medicare Other

## 2020-12-31 DIAGNOSIS — Z79899 Other long term (current) drug therapy: Secondary | ICD-10-CM | POA: Insufficient documentation

## 2020-12-31 DIAGNOSIS — W108XXA Fall (on) (from) other stairs and steps, initial encounter: Secondary | ICD-10-CM | POA: Diagnosis not present

## 2020-12-31 DIAGNOSIS — S8992XA Unspecified injury of left lower leg, initial encounter: Secondary | ICD-10-CM | POA: Diagnosis present

## 2020-12-31 DIAGNOSIS — S8011XA Contusion of right lower leg, initial encounter: Secondary | ICD-10-CM

## 2020-12-31 DIAGNOSIS — S83422A Sprain of lateral collateral ligament of left knee, initial encounter: Secondary | ICD-10-CM | POA: Insufficient documentation

## 2020-12-31 DIAGNOSIS — Z96652 Presence of left artificial knee joint: Secondary | ICD-10-CM | POA: Diagnosis not present

## 2020-12-31 DIAGNOSIS — Z7982 Long term (current) use of aspirin: Secondary | ICD-10-CM | POA: Diagnosis not present

## 2020-12-31 NOTE — ED Triage Notes (Signed)
Larey Seat today going down the stairs , right leg pain , obvious swelling and hematoma , no blood thinners . Left knee pain , heard "a pop " . Ambulatory to triage

## 2020-12-31 NOTE — Discharge Instructions (Signed)
Use your compression sock to help with swelling and elevate when you can.  Other than showing arthritis in your left knee your x-rays are normal.

## 2020-12-31 NOTE — ED Provider Notes (Signed)
[facility] EMERGENCY DEPT Provider Note   CSN: [id number] Arrival date & time: 12/31/20  1540     History Chief Complaint  Patient presents with   Marletta Lor    [name] is a 75 y.o. female.  Patient is a 75 year old female with a history of psoriasis, arthritis and prior right knee replacement who presents today after a fall.  Patient was singing at her choir practice at church and she went to go down the steps but the poinsettia's were in the way and could not use the handrail and she reports missing a step and falling.  She hit her right leg on the side of a step and felt a pop in her left knee which is her bad knee.  She denies hitting her head or neck or losing consciousness.  She reports landing directly on her butt.  She was able to stand and walk and went to lunch and then was able to drive herself home but later this afternoon she noticed the bruise on her right outer leg was getting larger and her husband recommended that she should come get it checked out.  She has still been able to ambulate.  She has some soreness in her buttocks but denies any other pain in her upper extremities, chest or abdomen.  She takes no anticoagulation.  She reports currently there is soreness in her left knee little more than baseline and discomfort in her right lower leg which seems to be worse with walking or palpation.  The history is provided by the patient.  Fall      Past Medical History:  Diagnosis Date   Arthritis    Psoriasis    Intermittent; all over body and waxes and wanes   Tinnitus of right ear 2013   Intermittent; Melatonin has helped   Vaginal delivery 1973, 1981    Patient Active Problem List   Diagnosis Date Noted   Hallux valgus, acquired 07/01/2019   Abnormal EKG 01/20/2019   Tinnitus of right ear    Psoriasis    Arthritis    Osteopenia 01/05/2019    Past Surgical History:  Procedure Laterality Date   goiter  1968   was on her Thyroid   IR KYPHO  THORACIC WITH BONE BIOPSY  01/20/2019   JOINT REPLACEMENT     Knee replacement Right 2019   TOTAL HIP ARTHROPLASTY     Lt 2002, Rt 2005   VEIN LIGATION AND STRIPPING Right 2005     OB History   No obstetric history on file.     Family History  Problem Relation Age of Onset   Dementia Mother    Heart disease Father        CABG x 5   Psoriasis Sister    Osteoarthritis Sister    Pneumonia Maternal Grandfather    Heart attack Paternal Grandmother     Social History   Tobacco Use   Smoking status: Never   Smokeless tobacco: Never  Vaping Use   Vaping Use: Never used  Substance Use Topics   Alcohol use: Never   Drug use: Never    Home Medications Prior to Admission medications   Medication Sig Start Date End Date Taking? Authorizing Provider  acetaminophen (TYLENOL) 650 MG CR tablet Take 650 mg by mouth every 8 (eight) hours as needed for pain.    [provider]  aspirin EC 81 MG tablet Take 81 mg by mouth daily.    [provider]  benzonatate (  TESSALON) 100 MG capsule Take 1 capsule (100 mg total) by mouth 3 (three) times daily as needed for cough. Patient not taking: Reported on 10/07/2020 05/30/20   Saguier, Ramon Dredge, PA-C  dorzolamide-timolol (COSOPT) 22.3-6.8 MG/ML ophthalmic solution 1 drop 2 (two) times daily. 10/03/20   [provider]  fluticasone (FLONASE) 50 MCG/ACT nasal spray Place 2 sprays into both nostrils daily. 05/30/20   Saguier, Ramon Dredge, PA-C  gabapentin (NEURONTIN) 100 MG capsule SMARTSIG:1 Capsule(s) By Mouth Every Evening Patient not taking: Reported on 10/07/2020 04/23/19   [provider]  ibuprofen (ADVIL) 200 MG tablet Take 600 mg by mouth every 6 (six) hours as needed. Patient not taking: Reported on 10/07/2020    [provider]  latanoprost (XALATAN) 0.005 % ophthalmic solution 1 drop at bedtime. 01/26/19   [provider]  levocetirizine (XYZAL) 5 MG tablet Take 1 tablet (5 mg total) by mouth every  evening. 05/30/20   Saguier, Ramon Dredge, PA-C  Melatonin 10 MG SUBL Place 1 tablet under the tongue at bedtime.    [provider]  naproxen sodium (ALEVE) 220 MG tablet Take 220 mg by mouth daily as needed. Patient not taking: Reported on 10/07/2020    [provider]  SYSTANE ULTRA 0.4-0.3 % SOLN  01/26/19   [provider]  triamcinolone cream (KENALOG) 0.1 % Apply 1 application topically 2 (two) times daily. 09/30/19   [provider]  Vitamin D, Ergocalciferol, (DRISDOL) 1.25 MG (50000 UNIT) CAPS capsule Take 50,000 Units by mouth once a week. Patient not taking: Reported on 10/07/2020 02/05/19   [provider]    Allergies    Patient has no known allergies.  Review of Systems   Review of Systems  All other systems reviewed and are negative.  Physical Exam Updated Vital Signs BP (!) 159/94    Pulse 90    Temp 98.4 F (36.9 C)    Resp 18    Ht 5\' 8"  (1.727 m)    Wt 86.2 kg    SpO2 99%    BMI 28.89 kg/m   Physical Exam Vitals and nursing note reviewed.  Constitutional:      General: She is not in acute distress.    Appearance: She is well-developed.  HENT:     Head: Normocephalic and atraumatic.  Eyes:     Pupils: Pupils are equal, round, and reactive to light.  Cardiovascular:     Rate and Rhythm: Normal rate and regular rhythm.     Pulses: Normal pulses.     Heart sounds: Normal heart sounds. No murmur heard.   No friction rub.  Pulmonary:     Effort: Pulmonary effort is normal.     Breath sounds: Normal breath sounds. No wheezing or rales.  Musculoskeletal:        General: Tenderness present. Normal range of motion.     Cervical back: Normal range of motion and neck supple. No tenderness.     Left knee: Swelling present. No ecchymosis. Normal range of motion. Tenderness present over the lateral joint line. No LCL laxity or MCL laxity.      Legs:     Comments: No edema  Skin:    General: Skin is warm and dry.     Findings: No  rash.  Neurological:     Mental Status: She is alert and oriented to person, place, and time. Mental status is at baseline.     Cranial Nerves: No cranial nerve deficit.     Motor:  No weakness.     Gait: Gait normal.  Psychiatric:        Mood and Affect: Mood normal.        Behavior: Behavior normal.    ED Results / Procedures / Treatments   Labs (all labs ordered are listed, but only abnormal results are displayed) Labs Reviewed - No data to display  EKG None  Radiology DG Tibia/Fibula Right  Result Date: 12/31/2020 CLINICAL DATA:  Injury, right leg pain. EXAM: RIGHT TIBIA AND FIBULA - 2 VIEW COMPARISON:  None. FINDINGS: There is no evidence of fracture or other focal bone lesions. Visualized portion of the right knee orthopedic hardware appears intact. There is prominent swelling in the lateral aspect of the leg. IMPRESSION: No acute osseous abnormality in the right tibia or fibula. Electronically Signed   By: Emmaline Kluver M.D.   On: 12/31/2020 16:54   DG Knee Complete 4 Views Left  Result Date: 12/31/2020 CLINICAL DATA:  Fall, left knee pain.  Patient heard a pop. EXAM: LEFT KNEE - COMPLETE 4+ VIEW COMPARISON:  None. FINDINGS: No evidence of fracture, dislocation, or joint effusion. There is tricompartmental moderate joint space narrowing and osteophytes. No focal bone lesion. Regional soft tissues are unremarkable. IMPRESSION: 1. No acute findings in the left knee. 2. Moderate tricompartmental osteoarthritis. Electronically Signed   By: Emmaline Kluver M.D.   On: 12/31/2020 16:55    Procedures Procedures   Medications Ordered in ED Medications - No data to display  ED Course  I have reviewed the triage vital signs and the nursing notes.  Pertinent labs & imaging results that were available during my care of the patient were reviewed by me and considered in my medical decision making (see chart for details).    MDM Rules/Calculators/A&P                          Patient presenting today after a fall earlier in the day.  She has been able to ambulate but is having some soreness of her left knee which she did feel a pop during the fall as well as pain in the right lower leg.  Patient does have a notable hematoma in the right lower lateral portion of the fibula.  Minimal involvement of the lateral malleolus.  Full range of motion.  Patient does not take any anticoagulation.  Neurovascularly intact.  She has some mild tenderness over her buttocks but low suspicion for lumbar injury.  No head or neck injury.    Imaging without acute findings.  Will d/c home with supportive care.  MDM   Amount and/or Complexity of Data Reviewed Tests in the radiology section of CPT: ordered and reviewed Independent visualization of images, tracings, or specimens: yes       Final Clinical Impression(s) / ED Diagnoses Final diagnoses:  Traumatic hematoma of right lower leg, initial encounter  Sprain of lateral collateral ligament of left knee, initial encounter    Rx / DC Orders ED Discharge Orders     None        Gwyneth Sprout, MD 12/31/20 1705

## 2020-12-31 NOTE — ED Notes (Signed)
Dc instructions reviewed with patient. Patient voiced understanding. Dc with belongings.  °

## 2021-01-17 ENCOUNTER — Encounter (HOSPITAL_COMMUNITY): Payer: Self-pay | Admitting: Emergency Medicine

## 2021-01-17 ENCOUNTER — Other Ambulatory Visit: Payer: Self-pay

## 2021-01-17 ENCOUNTER — Emergency Department (HOSPITAL_COMMUNITY)
Admission: EM | Admit: 2021-01-17 | Discharge: 2021-01-17 | Disposition: A | Discharge status: Home / Self Care | Payer: Medicare Other | Attending: Emergency Medicine | Admitting: Emergency Medicine

## 2021-01-17 ENCOUNTER — Emergency Department (HOSPITAL_BASED_OUTPATIENT_CLINIC_OR_DEPARTMENT_OTHER): Payer: Medicare Other

## 2021-01-17 DIAGNOSIS — M79604 Pain in right leg: Secondary | ICD-10-CM | POA: Diagnosis present

## 2021-01-17 DIAGNOSIS — Z7982 Long term (current) use of aspirin: Secondary | ICD-10-CM | POA: Insufficient documentation

## 2021-01-17 DIAGNOSIS — L03115 Cellulitis of right lower limb: Secondary | ICD-10-CM | POA: Diagnosis not present

## 2021-01-17 LAB — BASIC METABOLIC PANEL
Anion gap: 9 (ref 5–15)
BUN: 14 mg/dL (ref 8–23)
CO2: 25 mmol/L (ref 22–32)
Calcium: 9 mg/dL (ref 8.9–10.3)
Chloride: 104 mmol/L (ref 98–111)
Creatinine, Ser: 0.75 mg/dL (ref 0.44–1.00)
GFR, Estimated: >60 mL/min (ref >60)
Glucose, Bld: 122 mg/dL — ABNORMAL HIGH (ref 70–99)
Potassium: 3.7 mmol/L (ref 3.5–5.1)
Sodium: 138 mmol/L (ref 135–145)

## 2021-01-17 LAB — CBC WITH DIFFERENTIAL/PLATELET
Abs Immature Granulocytes: 0.01 10*3/uL (ref 0.00–0.07)
Basophils Absolute: 0.1 10*3/uL (ref 0.0–0.1)
Basophils Relative: 1 %
Eosinophils Absolute: 0.3 10*3/uL (ref 0.0–0.5)
Eosinophils Relative: 4 %
HCT: 43.6 % (ref 36.0–46.0)
Hemoglobin: 14 g/dL (ref 12.0–15.0)
Immature Granulocytes: 0 %
Lymphocytes Relative: 23 %
Lymphs Abs: 1.5 10*3/uL (ref 0.7–4.0)
MCH: 29.5 pg (ref 26.0–34.0)
MCHC: 32.1 g/dL (ref 30.0–36.0)
MCV: 91.8 fL (ref 80.0–100.0)
Monocytes Absolute: 0.5 10*3/uL (ref 0.1–1.0)
Monocytes Relative: 8 %
Neutro Abs: 4.2 10*3/uL (ref 1.7–7.7)
Neutrophils Relative %: 64 %
Platelets: 330 10*3/uL (ref 150–400)
RBC: 4.75 MIL/uL (ref 3.87–5.11)
RDW: 14.3 % (ref 11.5–15.5)
WBC: 6.6 10*3/uL (ref 4.0–10.5)
nRBC: 0 % (ref 0.0–0.2)

## 2021-01-17 LAB — LACTIC ACID, PLASMA: Lactic Acid, Venous: 1.6 mmol/L (ref 0.5–1.9)

## 2021-01-17 MED ORDER — CEPHALEXIN 500 MG PO CAPS: 500.0000 mg | ORAL_CAPSULE | Freq: Four times a day (QID) | ORAL | 0 refills | Status: DC

## 2021-01-17 MED ORDER — IBUPROFEN 600 MG PO TABS: 600.0000 mg | ORAL_TABLET | Freq: Four times a day (QID) | ORAL | 0 refills | Status: DC | PRN

## 2021-01-17 MED ORDER — ACETAMINOPHEN 325 MG PO TABS
650.0000 mg | ORAL_TABLET | Freq: Once | ORAL | Status: AC
  Administered 2021-01-17: 650 mg via ORAL
  Filled 2021-01-17: qty 2

## 2021-01-17 MED ORDER — CEFTRIAXONE SODIUM 500 MG IJ SOLR
500.0000 mg | Freq: Once | INTRAMUSCULAR | Status: AC
  Administered 2021-01-17: 500 mg via INTRAMUSCULAR
  Filled 2021-01-17: qty 500

## 2021-01-17 MED ORDER — STERILE WATER FOR INJECTION IJ SOLN
INTRAMUSCULAR | Status: AC
  Filled 2021-01-17: qty 10

## 2021-01-17 NOTE — ED Triage Notes (Signed)
Patient missed her step and fell while going down the stairs last 12/31/20 , reports swelling/redness at right lower leg with pain and drainage , denies fever or chills .

## 2021-01-17 NOTE — Progress Notes (Signed)
Lower extremity venous has been completed.   Preliminary results in CV Proc.   Aundra Millet Shatera Rennert 01/17/2021 11:27 AM

## 2021-01-17 NOTE — ED Provider Notes (Signed)
Emergency Medicine Provider Triage Evaluation Note  [name] , a 76 y.o. female  was evaluated in triage.  Pt complains of worsening pain, redness, and swelling to her right lower leg.  She fell down the stairs on 12/31/2020, she was seen at med center, diagnosed with a hematoma and discharge.  She states that over the past few days she has had increasing pain in the area.  She noted today that she has a demarcated area of erythema.  The pain has been increasing.  She has had occasional drainage over the past week from the area.  No fevers.  She otherwise feels well.  Review of Systems  Positive: Right leg pain, redness and swelling Negative: Fevers  Physical Exam  BP (!) 181/104    Pulse 85    Temp 97.8 F (36.6 C) (Oral)    Resp 18    Ht 5\' 8"  (1.727 m)    Wt 90 kg    SpO2 99%    BMI 30.17 kg/m  Gen:   Awake, no distress   Resp:  Normal effort  MSK:   Moves extremities without difficulty  Other:  Right lateral lower leg has approximately 7 cm x 9 cm area of erythema with associated edema.  There are 2 centralized wounds, with bloody drainage.  Area is warm to the touch.  Medical Decision Making  Medically screening exam initiated at 1:13 AM.  Appropriate orders placed.  [name] was informed that the remainder of the evaluation will be completed by another provider, this initial triage assessment does not replace that evaluation, and the importance of remaining in the ED until their evaluation is complete.  With evolution of symptoms concerned that patient may have an infection and what was previously a hematoma. She did have x-rays obtained when this happened, therefore will not repeat as she has not had any additional trauma.  Labs are ordered.  Area of erythema is outlined with skin marker.   [name] Ramus, PA-C 01/17/21 0115    03/17/21, MD 01/17/21 [phone]

## 2021-01-17 NOTE — ED Provider Notes (Signed)
[facility] EMERGENCY DEPARTMENT Provider Note   CSN: [id number] Arrival date & time: 01/17/21  0046     History  Chief Complaint  Patient presents with   Fall / Leg Injury    [name] is a 76 y.o. female.  Patient is a 76 yo female presenting for leg pain. Patient admits to right leg pain, swelling, redness, warmth, and drainage of leg wound x 2 days. States she fell down stairs on 12/17 and had injury and bruising to right leg. States she had skin tear that is now infected. Denies fractures. Denies hx of DVT-no prior eval.  The history is provided by the patient. No language interpreter was used.      Home Medications Prior to Admission medications   Medication Sig Start Date End Date Taking? Authorizing Provider  acetaminophen (TYLENOL) 650 MG CR tablet Take 650 mg by mouth every 8 (eight) hours as needed for pain.    [provider]  aspirin EC 81 MG tablet Take 81 mg by mouth daily.    [provider]  benzonatate (TESSALON) 100 MG capsule Take 1 capsule (100 mg total) by mouth 3 (three) times daily as needed for cough. Patient not taking: Reported on 10/07/2020 05/30/20   Saguier, Ramon Dredge, PA-C  dorzolamide-timolol (COSOPT) 22.3-6.8 MG/ML ophthalmic solution 1 drop 2 (two) times daily. 10/03/20   [provider]  fluticasone (FLONASE) 50 MCG/ACT nasal spray Place 2 sprays into both nostrils daily. 05/30/20   Saguier, Ramon Dredge, PA-C  gabapentin (NEURONTIN) 100 MG capsule SMARTSIG:1 Capsule(s) By Mouth Every Evening Patient not taking: Reported on 10/07/2020 04/23/19   [provider]  ibuprofen (ADVIL) 200 MG tablet Take 600 mg by mouth every 6 (six) hours as needed. Patient not taking: Reported on 10/07/2020    [provider]  latanoprost (XALATAN) 0.005 % ophthalmic solution 1 drop at bedtime. 01/26/19   [provider]  levocetirizine (XYZAL) 5 MG tablet Take 1 tablet (5 mg total) by mouth every evening.  05/30/20   Saguier, Ramon Dredge, PA-C  Melatonin 10 MG SUBL Place 1 tablet under the tongue at bedtime.    [provider]  naproxen sodium (ALEVE) 220 MG tablet Take 220 mg by mouth daily as needed. Patient not taking: Reported on 10/07/2020    [provider]  SYSTANE ULTRA 0.4-0.3 % SOLN  01/26/19   [provider]  triamcinolone cream (KENALOG) 0.1 % Apply 1 application topically 2 (two) times daily. 09/30/19   [provider]  Vitamin D, Ergocalciferol, (DRISDOL) 1.25 MG (50000 UNIT) CAPS capsule Take 50,000 Units by mouth once a week. Patient not taking: Reported on 10/07/2020 02/05/19   [provider]      Allergies    Patient has no known allergies.    Review of Systems   Review of Systems  Constitutional:  Negative for chills and fever.  HENT:  Negative for ear pain and sore throat.   Eyes:  Negative for pain and visual disturbance.  Respiratory:  Negative for cough and shortness of breath.   Cardiovascular:  Negative for chest pain and palpitations.  Gastrointestinal:  Negative for abdominal pain and vomiting.  Genitourinary:  Negative for dysuria and hematuria.  Musculoskeletal:  Negative for arthralgias and back pain.       Right leg pain and swelling   Skin:  Positive for color change, rash and wound.  Neurological:  Negative for seizures and syncope.  All other systems reviewed and are negative.  Physical Exam Updated Vital Signs BP 140/78 (BP Location: Right Arm)    Pulse 66    Temp 98.2 F (36.8 C)    Resp 18    Ht 5\' 8"  (1.727 m)    Wt 90 kg    SpO2 97%    BMI 30.17 kg/m  Physical Exam Vitals and nursing note reviewed.  Constitutional:      Appearance: Normal appearance.  HENT:     Head: Normocephalic and atraumatic.  Cardiovascular:     Rate and Rhythm: Normal rate and regular rhythm.     Pulses:          Dorsalis pedis pulses are 2+ on the right side and 2+ on the left side.  Pulmonary:     Effort: Pulmonary effort is  normal.     Breath sounds: Normal breath sounds.  Abdominal:     General: Bowel sounds are normal.     Palpations: Abdomen is soft.  Skin:    Capillary Refill: Capillary refill takes less than 2 seconds.     Findings: Ecchymosis and rash present.          Comments: Soft compartments.  Neurological:     General: No focal deficit present.     Mental Status: She is alert and oriented to person, place, and time.     Sensory: Sensation is intact.     Motor: Motor function is intact.    ED Results / Procedures / Treatments   Labs (all labs ordered are listed, but only abnormal results are displayed) Labs Reviewed  BASIC METABOLIC PANEL - Abnormal; Notable for the following components:      Result Value   Glucose, Bld 122 (*)    All other components within normal limits  CBC WITH DIFFERENTIAL/PLATELET  LACTIC ACID, PLASMA  LACTIC ACID, PLASMA    EKG None  Radiology No results found.  Procedures Procedures    Medications Ordered in ED Medications  acetaminophen (TYLENOL) tablet 650 mg (has no administration in time range)    ED Course/ Medical Decision Making/ A&P                           Medical Decision Making  76 yo female presenting for leg wound. No sepsis. Right leg wound is warmth, tender, swollen, with purulent drainage. Right lower leg swelling from knee down present. Concern for cellulitis. Soft compartments. Pt neurovascularly intact. Tylenol given for pain. Rocephin given in ED with perscription for Keflex. Concern for DVT due to swelling that occurred prior to wound. Venous doppler demonstrates no DVT.  Patient in no distress and overall condition improved here in the ED. Rocephin given in ED and prescription for keflex provided. Detailed discussions were had with the patient regarding current findings, and need for close f/u with PCP or on call doctor. The patient has been instructed to return immediately if the symptoms worsen in any way for re-evaluation.  Characteristics of compartment syndrome described in detail with prompt return precautions. Patient verbalized understanding and is in agreement with current care plan. All questions answered prior to discharge.          Final Clinical Impression(s) / ED Diagnoses Final diagnoses:  Cellulitis of right lower extremity    Rx / DC Orders ED Discharge Orders     None         Lianne Cure, DO [phone]

## 2021-01-17 NOTE — ED Notes (Signed)
Patient transported to ultrasound.

## 2021-01-23 ENCOUNTER — Other Ambulatory Visit: Payer: Self-pay

## 2021-01-23 ENCOUNTER — Ambulatory Visit (INDEPENDENT_AMBULATORY_CARE_PROVIDER_SITE_OTHER): Payer: Medicare Other | Admitting: Physician Assistant

## 2021-01-23 ENCOUNTER — Encounter: Payer: Self-pay | Admitting: Physician Assistant

## 2021-01-23 VITALS — BP 120/74 | HR 79 | Temp 97.9°F | Ht 68.0 in | Wt 204.2 lb

## 2021-01-23 DIAGNOSIS — L03115 Cellulitis of right lower limb: Secondary | ICD-10-CM | POA: Diagnosis not present

## 2021-01-23 MED ORDER — CEPHALEXIN 500 MG PO CAPS: 500.0000 mg | ORAL_CAPSULE | Freq: Four times a day (QID) | ORAL | 0 refills | Status: AC

## 2021-01-23 NOTE — Patient Instructions (Signed)
It was great to see you!  Continue Keflex for another week -- I have sent this in  Keep ACE bandage on as tolerated to help with swelling  Wash with regular soap and water  Follow-up with me in 1 week, sooner if concerns  Take care,  Inda Coke PA-C

## 2021-01-23 NOTE — Progress Notes (Signed)
[name] is a 76 y.o. female here for a ED f/u of Cellulitis of right lower extremity.   History of Present Illness:   Chief Complaint  Patient presents with   Follow-up    Pt is here today for ED f/u from 01/17/2021. Pt is still having pain in right lower leg.     HPI  Leg wound On 01/17/21, [name] presented to the ED with c/o right leg pain that had been onset for two days. Additionally she was experiencing swelling, redness, warmth, and drainage of a leg wound that she sustained following a fall on 12/17. Leg and knee xrays were done on 12/31/20 and these were normal. During this visit, pt expressed concern that the skin wound could be infected. As the visit continued there was concern for DVT and cellulitis due to the swelling from her knee and down her right leg.  Ultrasound was negative for clot.  Due to nature of sx she was give rocephin and tylenol in the ED and showed improvement shortly after. Following this visit she was prescribed keflex 500 mg QID x 7 days and recommended to follow up with PCP.  CBC was 6.6.  Currently she is having just slight improvement of symptoms. She reports that she has had slight improvement in swelling but also slight worsening of pain. She has tried to keep her legs elevated. She did wear compression hose but this was irritating to her skin. She is taking tylenol regularly.  She has not had discharge from her wound.    Past Medical History:  Diagnosis Date   Arthritis    Psoriasis    Intermittent; all over body and waxes and wanes   Tinnitus of right ear 2013   Intermittent; Melatonin has helped   Vaginal delivery 1973, 1981     Social History   Tobacco Use   Smoking status: Never   Smokeless tobacco: Never  Vaping Use   Vaping Use: Never used  Substance Use Topics   Alcohol use: Never   Drug use: Never    Past Surgical History:  Procedure Laterality Date   goiter  1968   was on her Thyroid   IR KYPHO THORACIC WITH BONE BIOPSY   01/20/2019   JOINT REPLACEMENT     Knee replacement Right 2019   TOTAL HIP ARTHROPLASTY     Lt 2002, Rt 2005   VEIN LIGATION AND STRIPPING Right 2005    Family History  Problem Relation Age of Onset   Dementia Mother    Heart disease Father        CABG x 5   Psoriasis Sister    Osteoarthritis Sister    Pneumonia Maternal Grandfather    Heart attack Paternal Grandmother     No Known Allergies  Current Medications:   Current Outpatient Medications:    acetaminophen (TYLENOL) 650 MG CR tablet, Take 650 mg by mouth every 8 (eight) hours as needed for pain., Disp: , Rfl:    aspirin EC 81 MG tablet, Take 81 mg by mouth daily., Disp: , Rfl:    benzonatate (TESSALON) 100 MG capsule, Take 1 capsule (100 mg total) by mouth 3 (three) times daily as needed for cough., Disp: 30 capsule, Rfl: 0   cephALEXin (KEFLEX) 500 MG capsule, Take 1 capsule (500 mg total) by mouth 4 (four) times daily for 7 days., Disp: 28 capsule, Rfl: 0   dorzolamide-timolol (COSOPT) 22.3-6.8 MG/ML ophthalmic solution, 1 drop 2 (two) times daily., Disp: , Rfl:  ibuprofen (ADVIL) 600 MG tablet, Take 1 tablet (600 mg total) by mouth every 6 (six) hours as needed for mild pain., Disp: 30 tablet, Rfl: 0   levocetirizine (XYZAL) 5 MG tablet, Take 1 tablet (5 mg total) by mouth every evening., Disp: 30 tablet, Rfl: 3   Melatonin 10 MG SUBL, Place 1 tablet under the tongue at bedtime., Disp: , Rfl:    SYSTANE ULTRA 0.4-0.3 % SOLN, , Disp: , Rfl:    triamcinolone cream (KENALOG) 0.1 %, Apply 1 application topically 2 (two) times daily., Disp: , Rfl:    fluticasone (FLONASE) 50 MCG/ACT nasal spray, Place 2 sprays into both nostrils daily. (Patient not taking: Reported on 01/23/2021), Disp: 16 g, Rfl: 1   gabapentin (NEURONTIN) 100 MG capsule, SMARTSIG:1 Capsule(s) By Mouth Every Evening (Patient not taking: Reported on 10/07/2020), Disp: , Rfl:    ibuprofen (ADVIL) 200 MG tablet, Take 600 mg by mouth every 6 (six) hours as needed.  (Patient not taking: Reported on 10/07/2020), Disp: , Rfl:    naproxen sodium (ALEVE) 220 MG tablet, Take 220 mg by mouth daily as needed. (Patient not taking: Reported on 10/07/2020), Disp: , Rfl:    Review of Systems:   ROS Negative unless otherwise specified per HPI.  Vitals:   Vitals:   01/23/21 1405  BP: 120/74  Pulse: 79  Temp: 97.9 F (36.6 C)  TempSrc: Temporal  SpO2: 95%  Weight: 204 lb 4 oz (92.6 kg)  Height: 5\' 8"  (1.727 m)     Body mass index is 31.06 kg/m.  Physical Exam:   Physical Exam Constitutional:      Appearance: Normal appearance. She is well-developed.  HENT:     Head: Normocephalic and atraumatic.  Eyes:     General: Lids are normal.     Extraocular Movements: Extraocular movements intact.     Conjunctiva/sclera: Conjunctivae normal.  Cardiovascular:     Comments: Cap refill adequate in r toes Pulmonary:     Effort: Pulmonary effort is normal.  Musculoskeletal:        General: Normal range of motion.     Cervical back: Normal range of motion and neck supple.  Skin:    General: Skin is warm and dry.     Comments: Lateral lower RLE with 2 cm area of escar surrounded by erythema with TTP; no active drainage, mild swelling  Compartments to RLE overall soft  Prior skin marker area shows no extension of erythema outside of line  Neurological:     Mental Status: She is alert and oriented to person, place, and time.  Psychiatric:        Attention and Perception: Attention and perception normal.        Mood and Affect: Mood normal.        Behavior: Behavior normal.        Thought Content: Thought content normal.        Judgment: Judgment normal.    Assessment and Plan:   Cellulitis of right lower extremity Ongoing No red flags suggesting worsening infection Will continue oral keflex 500 mg QID x 7 days Compression with ACE bandage Tylenol for discomfort She is aware of worsening red flags and concerns for compartment syndrome (not evident  on todays exam) Follow-up with me in 1 week, sooner if concerns   I,[name],acting as a scribe for [employer], PA.,have documented all relevant documentation on the behalf of [name] Coke, PA,as directed by  [name] Coke, PA while in the  presence of [name], [place].  I, [name], [place], have reviewed all documentation for this visit. The documentation on 01/23/21 for the exam, diagnosis, procedures, and orders are all accurate and complete.  Time spent with patient today was 26 minutes which consisted of chart review, discussing diagnosis, work up, treatment answering questions and documentation.  [name] Coke, PA-C

## 2021-01-26 ENCOUNTER — Telehealth: Payer: Self-pay | Admitting: *Deleted

## 2021-01-26 ENCOUNTER — Ambulatory Visit (INDEPENDENT_AMBULATORY_CARE_PROVIDER_SITE_OTHER): Payer: Medicare Other | Admitting: Physician Assistant

## 2021-01-26 ENCOUNTER — Encounter: Payer: Self-pay | Admitting: Physician Assistant

## 2021-01-26 ENCOUNTER — Other Ambulatory Visit: Payer: Self-pay

## 2021-01-26 VITALS — BP 140/80 | HR 68 | Temp 97.6°F | Ht 68.0 in | Wt 204.2 lb

## 2021-01-26 DIAGNOSIS — L03115 Cellulitis of right lower limb: Secondary | ICD-10-CM | POA: Diagnosis not present

## 2021-01-26 NOTE — Progress Notes (Signed)
[name] is a 76 y.o. female here for a wound check.   History of Present Illness:   Chief Complaint  Patient presents with   Wound Check    Pt is here due to right lower extremity hematoma/wound started to bleed last night, bright red blood.    HPI  Leg Wound [name] presents today due to right lower extremity hematoma/wound starting to bleed last night. Pt states the blood was dark red and the bleeding has been slow. Despite this, she has noticed that her swelling has improved. At this time, [name] is currently compliant with taking keflex 500 mg QID x 7 days, but wanted to have this re-evaluated to be sure there is no cause for concern. Denies fever, chills, odor, tenderness, or significant pain.    Past Medical History:  Diagnosis Date   Arthritis    Psoriasis    Intermittent; all over body and waxes and wanes   Tinnitus of right ear 2013   Intermittent; Melatonin has helped   Vaginal delivery 1973, 1981     Social History   Tobacco Use   Smoking status: Never   Smokeless tobacco: Never  Vaping Use   Vaping Use: Never used  Substance Use Topics   Alcohol use: Never   Drug use: Never    Past Surgical History:  Procedure Laterality Date   goiter  1968   was on her Thyroid   IR KYPHO THORACIC WITH BONE BIOPSY  01/20/2019   JOINT REPLACEMENT     Knee replacement Right 2019   TOTAL HIP ARTHROPLASTY     Lt 2002, Rt 2005   VEIN LIGATION AND STRIPPING Right 2005    Family History  Problem Relation Age of Onset   Dementia Mother    Heart disease Father        CABG x 5   Psoriasis Sister    Osteoarthritis Sister    Pneumonia Maternal Grandfather    Heart attack Paternal Grandmother     No Known Allergies  Current Medications:   Current Outpatient Medications:    acetaminophen (TYLENOL) 650 MG CR tablet, Take 650 mg by mouth every 8 (eight) hours as needed for pain., Disp: , Rfl:    aspirin EC 81 MG tablet, Take 81 mg by mouth daily., Disp: , Rfl:     benzonatate (TESSALON) 100 MG capsule, Take 1 capsule (100 mg total) by mouth 3 (three) times daily as needed for cough., Disp: 30 capsule, Rfl: 0   cephALEXin (KEFLEX) 500 MG capsule, Take 1 capsule (500 mg total) by mouth 4 (four) times daily for 7 days., Disp: 28 capsule, Rfl: 0   dorzolamide-timolol (COSOPT) 22.3-6.8 MG/ML ophthalmic solution, 1 drop 2 (two) times daily., Disp: , Rfl:    fluticasone (FLONASE) 50 MCG/ACT nasal spray, Place 2 sprays into both nostrils daily., Disp: 16 g, Rfl: 1   gabapentin (NEURONTIN) 100 MG capsule, , Disp: , Rfl:    ibuprofen (ADVIL) 200 MG tablet, Take 600 mg by mouth every 6 (six) hours as needed., Disp: , Rfl:    ibuprofen (ADVIL) 600 MG tablet, Take 1 tablet (600 mg total) by mouth every 6 (six) hours as needed for mild pain., Disp: 30 tablet, Rfl: 0   levocetirizine (XYZAL) 5 MG tablet, Take 1 tablet (5 mg total) by mouth every evening., Disp: 30 tablet, Rfl: 3   Melatonin 10 MG SUBL, Place 1 tablet under the tongue at bedtime., Disp: , Rfl:    naproxen sodium (ALEVE) 220  MG tablet, Take 220 mg by mouth daily as needed., Disp: , Rfl:    SYSTANE ULTRA 0.4-0.3 % SOLN, , Disp: , Rfl:    triamcinolone cream (KENALOG) 0.1 %, Apply 1 application topically 2 (two) times daily., Disp: , Rfl:    Review of Systems:   ROS Negative unless otherwise specified per HPI. Vitals:   Vitals:   01/26/21 1140  BP: 140/80  Pulse: 68  Temp: 97.6 F (36.4 C)  TempSrc: Temporal  SpO2: 97%  Weight: 204 lb 4 oz (92.6 kg)  Height: 5\' 8"  (1.727 m)     Body mass index is 31.06 kg/m.  Physical Exam:   Physical Exam Constitutional:      Appearance: Normal appearance. She is well-developed.  HENT:     Head: Normocephalic and atraumatic.  Eyes:     General: Lids are normal.     Extraocular Movements: Extraocular movements intact.     Conjunctiva/sclera: Conjunctivae normal.  Cardiovascular:     Comments: Cap refill adequate in r toes Pulmonary:     Effort:  Pulmonary effort is normal.  Musculoskeletal:        General: Normal range of motion.     Cervical back: Normal range of motion and neck supple.  Skin:    General: Skin is warm and dry.     Comments: Lateral lower RLE with 2 cm area of escar surrounded by erythema with TTP; mild drainage of dark blood  No odor, no tenderness Wound is less indurated from prior evaluation and overall leg is less swollen than prior evaluation  Compartments to RLE overall soft  Prior skin marker area shows no extension of erythema outside of line  Neurological:     Mental Status: She is alert and oriented to person, place, and time.  Psychiatric:        Attention and Perception: Attention and perception normal.        Mood and Affect: Mood normal.        Behavior: Behavior normal.        Thought Content: Thought content normal.        Judgment: Judgment normal.    Assessment and Plan:   Cellulitis of right lower extremity Improving No red flags suggesting worsening infection Continue oral keflex 500 mg QID x 7 days Compression with ACE bandage, change dressing regularly May use tylenol for discomfort  Patient is aware of worsening red flags and concerns for compartment syndrome (not evident on todays exam) Follow up with me on Monday, sooner if concerns   I,[name],acting as a scribe for [employer], PA.,have documented all relevant documentation on the behalf of [name], PA,as directed by  [name] Motto, PA while in the presence of [name], [name].  I, [name], [name], have reviewed all documentation for this visit. The documentation on 01/26/21 for the exam, diagnosis, procedures, and orders are all accurate and complete.   [name]

## 2021-01-26 NOTE — Telephone Encounter (Signed)
Pt called saying last night when she took off the wrap it was bleeding, she put guaze on bleed some during the night and now it is bleeding. Pt said she was also cleaning it with Hydrogen peroxide wanted to know what soap to use. Told pt I will call her back.  Called pt back told her discussed with Lelon Mast and she said to continue compression wrap and do not use Hydrogen peroxide use mild soap only like Dove. Pt verbalized understanding. Told pt Lelon Mast will see her today to check would if she would like, we have an opening at 11:30. Also we can send a referral for the Wound center if you would like. Pt said yes she would like to be seen today and also would like referral. Told pt okay appt scheduled for 11:30 and I will place referral and someone will contact you to schedule an appt. Pt verbalized understanding.

## 2021-01-30 ENCOUNTER — Encounter: Payer: Self-pay | Admitting: Physician Assistant

## 2021-01-30 ENCOUNTER — Other Ambulatory Visit: Payer: Self-pay

## 2021-01-30 ENCOUNTER — Ambulatory Visit (INDEPENDENT_AMBULATORY_CARE_PROVIDER_SITE_OTHER): Payer: Medicare Other | Admitting: Physician Assistant

## 2021-01-30 VITALS — BP 126/80 | HR 70 | Temp 97.6°F | Ht 68.0 in | Wt 204.2 lb

## 2021-01-30 DIAGNOSIS — L03115 Cellulitis of right lower limb: Secondary | ICD-10-CM | POA: Diagnosis not present

## 2021-01-30 NOTE — Progress Notes (Signed)
[name] is a 76 y.o. female here for a follow up of cellulitis of right lower extremity.  History of Present Illness:   Chief Complaint  Patient presents with   Cellulitis    Right lower extremity    HPI  Leg Wound Since being here on 01/26/21, [name] reports that her leg wound improved slightly. States that over the weekend it did continue to bleed lightly, but has not bled today. Pt is still compliant with taking keflex 500 mg QID with no adverse effects. Reports she has noticed the swelling has improved as well as the redness. She does still have some pain, but it is improving slightly day by day. Denies fever, purulent drainage, or chills.    Past Medical History:  Diagnosis Date   Arthritis    Psoriasis    Intermittent; all over body and waxes and wanes   Tinnitus of right ear 2013   Intermittent; Melatonin has helped   Vaginal delivery 1973, 1981     Social History   Tobacco Use   Smoking status: Never   Smokeless tobacco: Never  Vaping Use   Vaping Use: Never used  Substance Use Topics   Alcohol use: Never   Drug use: Never    Past Surgical History:  Procedure Laterality Date   goiter  1968   was on her Thyroid   IR KYPHO THORACIC WITH BONE BIOPSY  01/20/2019   JOINT REPLACEMENT     Knee replacement Right 2019   TOTAL HIP ARTHROPLASTY     Lt 2002, Rt 2005   VEIN LIGATION AND STRIPPING Right 2005    Family History  Problem Relation Age of Onset   Dementia Mother    Heart disease Father        CABG x 5   Psoriasis Sister    Osteoarthritis Sister    Pneumonia Maternal Grandfather    Heart attack Paternal Grandmother     No Known Allergies  Current Medications:   Current Outpatient Medications:    acetaminophen (TYLENOL) 650 MG CR tablet, Take 650 mg by mouth every 8 (eight) hours as needed for pain., Disp: , Rfl:    aspirin EC 81 MG tablet, Take 81 mg by mouth daily., Disp: , Rfl:    benzonatate (TESSALON) 100 MG capsule, Take 1 capsule (100  mg total) by mouth 3 (three) times daily as needed for cough., Disp: 30 capsule, Rfl: 0   cephALEXin (KEFLEX) 500 MG capsule, Take 1 capsule (500 mg total) by mouth 4 (four) times daily for 7 days., Disp: 28 capsule, Rfl: 0   dorzolamide-timolol (COSOPT) 22.3-6.8 MG/ML ophthalmic solution, 1 drop 2 (two) times daily., Disp: , Rfl:    fluticasone (FLONASE) 50 MCG/ACT nasal spray, Place 2 sprays into both nostrils daily., Disp: 16 g, Rfl: 1   gabapentin (NEURONTIN) 100 MG capsule, , Disp: , Rfl:    ibuprofen (ADVIL) 200 MG tablet, Take 600 mg by mouth every 6 (six) hours as needed., Disp: , Rfl:    ibuprofen (ADVIL) 600 MG tablet, Take 1 tablet (600 mg total) by mouth every 6 (six) hours as needed for mild pain., Disp: 30 tablet, Rfl: 0   levocetirizine (XYZAL) 5 MG tablet, Take 1 tablet (5 mg total) by mouth every evening., Disp: 30 tablet, Rfl: 3   Melatonin 10 MG SUBL, Place 1 tablet under the tongue at bedtime., Disp: , Rfl:    naproxen sodium (ALEVE) 220 MG tablet, Take 220 mg by mouth daily as needed.,  Disp: , Rfl:    SYSTANE ULTRA 0.4-0.3 % SOLN, , Disp: , Rfl:    triamcinolone cream (KENALOG) 0.1 %, Apply 1 application topically 2 (two) times daily., Disp: , Rfl:    Review of Systems:   ROS Negative unless otherwise specified per HPI. Vitals:   Vitals:   01/30/21 1535  BP: 126/80  Pulse: 70  Temp: 97.6 F (36.4 C)  TempSrc: Temporal  SpO2: 98%  Weight: 204 lb 4 oz (92.6 kg)  Height: 5\' 8"  (1.727 m)     Body mass index is 31.06 kg/m.  Physical Exam:   Physical Exam Constitutional:      Appearance: Normal appearance. She is well-developed.  HENT:     Head: Normocephalic and atraumatic.  Eyes:     General: Lids are normal.     Extraocular Movements: Extraocular movements intact.     Conjunctiva/sclera: Conjunctivae normal.  Cardiovascular:     Comments: Cap refill adequate in r toes Pulmonary:     Effort: Pulmonary effort is normal.  Musculoskeletal:         General: Normal range of motion.     Cervical back: Normal range of motion and neck supple.  Skin:    General: Skin is warm and dry.     Comments: Lateral lower RLE with 2 cm area of escar surrounded by erythema with TTP; mild drainage of dark blood  No odor, no tenderness, no purulent drainage Wound is less indurated from prior evaluation and overall leg is less swollen than prior evaluation  Compartments to RLE overall soft  Prior skin marker area shows was washed off; new line redrawn today  No sign of extension of erythema outside of line  Neurological:     Mental Status: She is alert and oriented to person, place, and time.  Psychiatric:        Attention and Perception: Attention and perception normal.        Mood and Affect: Mood normal.        Behavior: Behavior normal.        Thought Content: Thought content normal.        Judgment: Judgment normal.    Assessment and Plan:   Cellulitis of right lower extremity  Improving  No red flags suggesting worsening infection Continue oral keflex 500 mg QID through tomorrow Compression with ACE bandage, change dressing regularly  May use tylenol for discomfort  Will refer to wound care for further evaluation to give second opinion Patient is aware of worsening red flags and concerns for compartment syndrome (not evident on todays exam) Follow up if symptoms worsen or concerns occur  I,Havlyn C Ratchford,acting as a scribe for [employer], PA.,have documented all relevant documentation on the behalf of Inda Coke, PA,as directed by  Inda Coke, PA while in the presence of Inda Coke, [place].  I, Inda Coke, [place], have reviewed all documentation for this visit. The documentation on 01/30/21 for the exam, diagnosis, procedures, and orders are all accurate and complete.   Inda Coke, PA-C

## 2021-01-30 NOTE — Patient Instructions (Signed)
It was great to see you!  Referral placed to wound care  Continue what you are doing -- it is working!  If redness goes outside of the lines -- please call me  If any concerns, please call me  Take care,  Inda Coke PA-C

## 2021-02-04 ENCOUNTER — Other Ambulatory Visit: Payer: Self-pay | Admitting: Medical

## 2021-02-08 ENCOUNTER — Encounter (HOSPITAL_BASED_OUTPATIENT_CLINIC_OR_DEPARTMENT_OTHER): Payer: Medicare Other | Attending: Internal Medicine | Admitting: Internal Medicine

## 2021-02-08 ENCOUNTER — Other Ambulatory Visit: Payer: Self-pay

## 2021-02-08 DIAGNOSIS — S8011XD Contusion of right lower leg, subsequent encounter: Secondary | ICD-10-CM | POA: Diagnosis present

## 2021-02-08 DIAGNOSIS — X58XXXD Exposure to other specified factors, subsequent encounter: Secondary | ICD-10-CM | POA: Insufficient documentation

## 2021-02-08 DIAGNOSIS — M1990 Unspecified osteoarthritis, unspecified site: Secondary | ICD-10-CM | POA: Insufficient documentation

## 2021-02-08 DIAGNOSIS — I87301 Chronic venous hypertension (idiopathic) without complications of right lower extremity: Secondary | ICD-10-CM | POA: Diagnosis not present

## 2021-02-08 DIAGNOSIS — L97818 Non-pressure chronic ulcer of other part of right lower leg with other specified severity: Secondary | ICD-10-CM | POA: Insufficient documentation

## 2021-02-09 NOTE — Progress Notes (Signed)
Scaletta, Lakeya ([id number]) Visit Report for 02/08/2021 Allergy List Details Patient Name: Date of Service: GEM, SAYSON 02/08/2021 7:30 A M Medical Record Number: [id number] Patient Account Number: [id number] Date of Birth/Sex: Treating RN: Aug 05, 1945 (76 y.o. Tonita Phoenix, Lauren Primary Care Tirth Cothron: Inda Coke Other Clinician: Referring Jenisa Monty: Treating Jeannifer Drakeford/Extender: Twana First, Aldona Bar Weeks in Treatment: 0 Allergies Active Allergies No Known Allergies Allergy Notes Electronic Signature(s) Signed: 02/09/2021 5:37:29 PM By: Rhae Hammock RN Entered By: Rhae Hammock on 02/08/2021 07:52:34 -------------------------------------------------------------------------------- Arrival Information Details Patient Name: Date of Service: [name], Darene 02/08/2021 7:30 A M Medical Record Number: [id number] Patient Account Number: [id number] Date of Birth/Sex: Treating RN: [birth year] (76 y.o. Tonita Phoenix, Lauren Primary Care Shadawn Hanaway: Inda Coke Other Clinician: Referring Evaristo Tsuda: Treating Tyah Acord/Extender: Hortencia Pilar in Treatment: 0 Visit Information Patient Arrived: Ambulatory Arrival Time: 07:51 Accompanied By: self Transfer Assistance: None Patient Identification Verified: Yes Secondary Verification Process Completed: Yes Patient Requires Transmission-Based Precautions: No Patient Has Alerts: No Electronic Signature(s) Signed: 02/09/2021 5:37:29 PM By: Rhae Hammock RN Entered By: Rhae Hammock on 02/08/2021 07:52:14 -------------------------------------------------------------------------------- Clinic Level of Care Assessment Details Patient Name: Date of Service: [name], Euretha 02/08/2021 7:30 A M Medical Record Number: [id number] Patient Account Number: [id number] Date of Birth/Sex: Treating RN: 09/14/1945 (76 y.o. America Brown Primary Care Jolane Bankhead: Inda Coke Other Clinician: Referring  Sulayman Manning: Treating Lonnette Shrode/Extender: Hortencia Pilar in Treatment: 0 Clinic Level of Care Assessment Items TOOL 1 Quantity Score X- 1 0 Use when EandM and Procedure is performed on INITIAL visit ASSESSMENTS - Nursing Assessment / Reassessment X- 1 20 General Physical Exam (combine w/ comprehensive assessment (listed just below) when performed on new pt. evals) X- 1 25 Comprehensive Assessment (HX, ROS, Risk Assessments, Wounds Hx, etc.) ASSESSMENTS - Wound and Skin Assessment / Reassessment X- 1 10 Dermatologic / Skin Assessment (not related to wound area) ASSESSMENTS - Ostomy and/or Continence Assessment and Care []  - 0 Incontinence Assessment and Management []  - 0 Ostomy Care Assessment and Management (repouching, etc.) PROCESS - Coordination of Care X - Simple Patient / Family Education for ongoing care 1 15 []  - 0 Complex (extensive) Patient / Family Education for ongoing care X- 1 10 Staff obtains Programmer, systems, Records, T Results / Process Orders est []  - 0 Staff telephones HHA, Nursing Homes / Clarify orders / etc []  - 0 Routine Transfer to another Facility (non-emergent condition) []  - 0 Routine Hospital Admission (non-emergent condition) X- 1 15 New Admissions / Biomedical engineer / Ordering NPWT Apligraf, etc. , []  - 0 Emergency Hospital Admission (emergent condition) PROCESS - Special Needs []  - 0 Pediatric / Minor Patient Management []  - 0 Isolation Patient Management []  - 0 Hearing / Language / Visual special needs []  - 0 Assessment of Community assistance (transportation, D/C planning, etc.) []  - 0 Additional assistance / Altered mentation []  - 0 Support Surface(s) Assessment (bed, cushion, seat, etc.) INTERVENTIONS - Miscellaneous []  - 0 External ear exam []  - 0 Patient Transfer (multiple staff / Civil Service fast streamer / Similar devices) []  - 0 Simple Staple / Suture removal (25 or less) []  - 0 Complex Staple / Suture removal (26  or more) []  - 0 Hypo/Hyperglycemic Management (do not check if billed separately) X- 1 15 Ankle / Brachial Index (ABI) - do not check if billed separately Has the patient been seen at the hospital within the last three years: Yes Total Score: 110 Level Of Care: New/Established - Level 3 Electronic Signature(s)  Signed: 02/08/2021 5:52:04 PM By: Dellie Catholic RN Entered By: Dellie Catholic on 02/08/2021 16:47:37 -------------------------------------------------------------------------------- Encounter Discharge Information Details Patient Name: Date of Service: [name], Liesl 02/08/2021 7:30 A M Medical Record Number: [id number] Patient Account Number: [id number] Date of Birth/Sex: Treating RN: 1945/07/21 (76 y.o. America Brown Primary Care Alayzha An: Inda Coke Other Clinician: Referring Denis Carreon: Treating Krimson Massmann/Extender: Hortencia Pilar in Treatment: 0 Encounter Discharge Information Items Post Procedure Vitals Discharge Condition: Stable Temperature (F): 98.4 Ambulatory Status: Ambulatory Pulse (bpm): 71 Discharge Destination: Home Respiratory Rate (breaths/min): 16 Transportation: Private Auto Blood Pressure (mmHg): 164/88 Accompanied By: self Schedule Follow-up Appointment: Yes Clinical Summary of Care: Patient Declined Electronic Signature(s) Signed: 02/08/2021 5:52:04 PM By: Dellie Catholic RN Entered By: Dellie Catholic on 02/08/2021 17:50:00 -------------------------------------------------------------------------------- Lower Extremity Assessment Details Patient Name: Date of Service: AAHNA, PANETO 02/08/2021 7:30 A M Medical Record Number: [id number] Patient Account Number: [id number] Date of Birth/Sex: Treating RN: [birth year] (76 y.o. Tonita Phoenix, Lauren Primary Care Charleton Deyoung: Inda Coke Other Clinician: Referring Major Santerre: Treating Geoff Dacanay/Extender: Alease Frame Weeks in Treatment: 0 Edema  Assessment Assessed: Shirlyn Goltz: No] [Right: Yes] Edema: [Left: Ye] [Right: s] Calf Left: Right: Point of Measurement: 33 cm From Medial Instep 40 cm Ankle Left: Right: Point of Measurement: 7 cm From Medial Instep 24 cm Vascular Assessment Pulses: Dorsalis Pedis Palpable: [Right:Yes] Posterior Tibial Palpable: [Right:Yes] Blood Pressure: Brachial: [Right:164] Ankle: [Right:Dorsalis Pedis: 180 1.10] Electronic Signature(s) Signed: 02/09/2021 5:37:29 PM By: Rhae Hammock RN Signed: 02/09/2021 5:37:29 PM By: Rhae Hammock RN Entered By: Rhae Hammock on 02/08/2021 08:22:59 -------------------------------------------------------------------------------- Multi Wound Chart Details Patient Name: Date of Service: [name], Ylonda 02/08/2021 7:30 A M Medical Record Number: [id number] Patient Account Number: [id number] Date of Birth/Sex: Treating RN: 01/15/1946 (75 y.o. Tonita Phoenix, Lauren Primary Care Litzi Binning: Inda Coke Other Clinician: Referring Krish Bailly: Treating Cleaven Demario/Extender: Hortencia Pilar in Treatment: 0 Vital Signs Height(in): 68 Pulse(bpm): 74 Weight(lbs): 200 Blood Pressure(mmHg): 164/88 Body Mass Index(BMI): 30.4 Temperature(F): 98.4 Respiratory Rate(breaths/min): 17 Photos: [N/A:N/A] Right, Lateral Lower Leg N/A N/A Wound Location: Trauma N/A N/A Wounding Event: Venous Leg Ulcer N/A N/A Primary Etiology: Osteoarthritis N/A N/A Comorbid History: 12/15/2020 N/A N/A Date Acquired: 0 N/A N/A Weeks of Treatment: Open N/A N/A Wound Status: No N/A N/A Wound Recurrence: 2.5x1x2 N/A N/A Measurements L x W x D (cm) 1.963 N/A N/A A (cm) : rea 3.927 N/A N/A Volume (cm) : 0.00% N/A N/A % Reduction in A rea: 0.00% N/A N/A % Reduction in Volume: 12 Starting Position 1 (o'clock): 12 Ending Position 1 (o'clock): 2.7 Maximum Distance 1 (cm): Yes N/A N/A Undermining: Full Thickness With Exposed Support N/A  N/A Classification: Structures Large N/A N/A Exudate A mount: Serosanguineous N/A N/A Exudate Type: red, brown N/A N/A Exudate Color: Distinct, outline attached N/A N/A Wound Margin: Small (1-33%) N/A N/A Granulation A mount: Red, Pink N/A N/A Granulation Quality: Large (67-100%) N/A N/A Necrotic A mount: Eschar N/A N/A Necrotic Tissue: Fat Layer (Subcutaneous Tissue): Yes N/A N/A Exposed Structures: Fascia: No Tendon: No Muscle: No Joint: No Bone: No None N/A N/A Epithelialization: Debridement - Excisional N/A N/A Debridement: Pre-procedure Verification/Time Out 08:32 N/A N/A Taken: Other N/A N/A Pain Control: Subcutaneous, Slough N/A N/A Tissue Debrided: Skin/Subcutaneous Tissue N/A N/A Level: 2.5 N/A N/A Debridement A (sq cm): rea Blade, Forceps N/A N/A Instrument: Large N/A N/A Bleeding: Pressure N/A N/A Hemostasis Achieved: 3 N/A N/A Procedural Pain: 2 N/A N/A Post Procedural Pain: Procedure was tolerated well  N/A N/A Debridement Treatment Response: 2.5x1x2 N/A N/A Post Debridement Measurements L x W x D (cm) 3.927 N/A N/A Post Debridement Volume: (cm) Debridement N/A N/A Procedures Performed: Treatment Notes Electronic Signature(s) Signed: 02/08/2021 4:28:28 PM By: Linton Ham MD Signed: 02/09/2021 5:37:29 PM By: Rhae Hammock RN Entered By: Linton Ham on 02/08/2021 08:46:03 -------------------------------------------------------------------------------- Multi-Disciplinary Care Plan Details Patient Name: Date of Service: [name], Aleina 02/08/2021 7:30 A M Medical Record Number: [id number] Patient Account Number: [id number] Date of Birth/Sex: Treating RN: Mar 12, 1945 (76 y.o. America Brown Primary Care Linnea Todisco: Inda Coke Other Clinician: Referring Yoltzin Ransom: Treating Kaetlin Bullen/Extender: Hortencia Pilar in Treatment: 0 Active Inactive Wound/Skin Impairment Nursing Diagnoses: Impaired tissue  integrity Goals: Patient/caregiver will verbalize understanding of skin care regimen Date Initiated: 02/08/2021 Target Resolution Date: 03/10/2021 Goal Status: Active Interventions: Assess ulceration(s) every visit Notes: Electronic Signature(s) Signed: 02/08/2021 5:52:04 PM By: Dellie Catholic RN Entered By: Dellie Catholic on 02/08/2021 17:47:28 -------------------------------------------------------------------------------- Pain Assessment Details Patient Name: Date of Service: [name], Toria 02/08/2021 7:30 A M Medical Record Number: [id number] Patient Account Number: [id number] Date of Birth/Sex: Treating RN: 05/16/1945 (76 y.o. Tonita Phoenix, Lauren Primary Care Jacie Tristan: Inda Coke Other Clinician: Referring Radwan Cowley: Treating Emberley Kral/Extender: Hortencia Pilar in Treatment: 0 Active Problems Location of Pain Severity and Description of Pain Patient Has Paino No Site Locations Pain Management and Medication Current Pain Management: Electronic Signature(s) Signed: 02/09/2021 5:37:29 PM By: Rhae Hammock RN Entered By: Rhae Hammock on 02/08/2021 07:56:28 -------------------------------------------------------------------------------- Patient/Caregiver Education Details Patient Name: Date of Service: [name], Larhonda 1/25/2023andnbsp7:30 [place] Record Number: [id number] Patient Account Number: [id number] Date of Birth/Gender: Treating RN: 1945-11-19 (76 y.o. America Brown Primary Care Physician: Inda Coke Other Clinician: Referring Physician: Treating Physician/Extender: Hortencia Pilar in Treatment: 0 Education Assessment Education Provided To: Patient Education Topics Provided Wound/Skin Impairment: Methods: Explain/Verbal Responses: Return demonstration correctly Electronic Signature(s) Signed: 02/09/2021 5:44:09 PM By: Dellie Catholic RN Entered By: Dellie Catholic on 02/09/2021  07:44:29 -------------------------------------------------------------------------------- Wound Assessment Details Patient Name: Date of Service: [name], Kameko 02/08/2021 7:30 A M Medical Record Number: [id number] Patient Account Number: [id number] Date of Birth/Sex: Treating RN: 02-20-1945 (76 y.o. Tonita Phoenix, Lauren Primary Care Jayquon Theiler: Inda Coke Other Clinician: Referring Lucyle Alumbaugh: Treating Yaasir Menken/Extender: Alease Frame Weeks in Treatment: 0 Wound Status Wound Number: 1 Primary Etiology: Venous Leg Ulcer Wound Location: Right, Lateral Lower Leg Wound Status: Open Wounding Event: Trauma Comorbid History: Osteoarthritis Date Acquired: 12/15/2020 Weeks Of Treatment: 0 Clustered Wound: No Photos Wound Measurements Length: (cm) 2.5 Width: (cm) 1 Depth: (cm) 2 Area: (cm) 1.963 Volume: (cm) 3.927 % Reduction in Area: 0% % Reduction in Volume: 0% Epithelialization: None Tunneling: No Undermining: Yes Starting Position (o'clock): 12 Ending Position (o'clock): 12 Maximum Distance: (cm) 2.7 Wound Description Classification: Full Thickness With Exposed Support Structures Wound Margin: Distinct, outline attached Exudate Amount: Large Exudate Type: Serosanguineous Exudate Color: red, brown Foul Odor After Cleansing: No Slough/Fibrino Yes Wound Bed Granulation Amount: Small (1-33%) Exposed Structure Granulation Quality: Red, Pink Fascia Exposed: No Necrotic Amount: Large (67-100%) Fat Layer (Subcutaneous Tissue) Exposed: Yes Necrotic Quality: Eschar Tendon Exposed: No Muscle Exposed: No Joint Exposed: No Bone Exposed: No Treatment Notes Wound #1 (Lower Leg) Wound Laterality: Right, Lateral Cleanser Soap and Water Discharge Instruction: cover right leg. May shower. Peri-Wound Care Topical Primary Dressing KerraCel Ag Gelling Fiber Dressing, 4x5 in (silver alginate) Discharge Instruction: Apply silver alginate to wound bed as  instructed Secondary Dressing Woven  Gauze Sponge, Non-Sterile 4x4 in Discharge Instruction: Apply over primary dressing as directed. Zetuvit Plus 4x8 in Discharge Instruction: Apply over primary dressing as directed. Secured With Compression Wrap ThreePress (3 layer compression wrap) Discharge Instruction: Apply three layer compression as directed. Compression Stockings Add-Ons Electronic Signature(s) Signed: 02/09/2021 5:37:29 PM By: Rhae Hammock RN Signed: 02/09/2021 6:13:38 PM By: Levan Hurst RN, BSN Entered By: Levan Hurst on 02/08/2021 08:39:38 -------------------------------------------------------------------------------- [place] Details Patient Name: Date of Service: [name], Mahsa 02/08/2021 7:30 A M Medical Record Number: [id number] Patient Account Number: [id number] Date of Birth/Sex: Treating RN: July 09, 1945 (76 y.o. Tonita Phoenix, Lauren Primary Care Cambri Plourde: Inda Coke Other Clinician: Referring Garey Alleva: Treating Evangelyn Crouse/Extender: Hortencia Pilar in Treatment: 0 Vital Signs Time Taken: 07:57 Temperature (F): 98.4 Height (in): 68 Pulse (bpm): 74 Source: Stated Respiratory Rate (breaths/min): 17 Weight (lbs): 200 Blood Pressure (mmHg): 164/88 Source: Stated Reference Range: 80 - 120 mg / dl Body Mass Index (BMI): 30.4 Electronic Signature(s) Signed: 02/09/2021 5:37:29 PM By: Rhae Hammock RN Entered By: Rhae Hammock on 02/08/2021 08:06:14

## 2021-02-09 NOTE — Progress Notes (Signed)
Brizuela, Bonita QuinLINDA ([id number]) Visit Report for 02/08/2021 Chief Complaint Document Details Patient Name: Date of Service: Karren CobbleGA RNER, Annahi 02/08/2021 7:30 A M Medical Record Number: [id number] Patient Account Number: [id number] Date of Birth/Sex: Treating RN: 01/01/1946 (76 y.o. Ardis RowanF) Breedlove, Lauren Primary Care Provider: Jarold MottoWorley, Samantha Other Clinician: Referring Provider: Treating Provider/Extender: Di Kindleobson, Laney Bagshaw Worley, Algis GreenhouseSamantha Weeks in Treatment: 0 Information Obtained from: Patient Chief Complaint 02/08/2021; patient is here for review of a wound on the right lateral lower leg which was initially traumatic Electronic Signature(s) Signed: 02/08/2021 4:28:28 PM By: Baltazar Najjarobson, Coren Sagan MD Entered By: Baltazar Najjarobson, Nason Conradt on 02/08/2021 08:47:45 -------------------------------------------------------------------------------- Debridement Details Patient Name: Date of Service: Earle GellGA RNER, Daleiza 02/08/2021 7:30 A M Medical Record Number: [id number] Patient Account Number: [id number] Date of Birth/Sex: Treating RN: 12/30/1945 (76 y.o. Ardis RowanF) Breedlove, Lauren Primary Care Provider: Jarold MottoWorley, Samantha Other Clinician: Referring Provider: Treating Provider/Extender: Montey Horaobson, Shallen Luedke Worley, Samantha Weeks in Treatment: 0 Debridement Performed for Assessment: Wound #1 Right,Lateral Lower Leg Performed By: Physician Maxwell Caulobson, Ainara Eldridge G., MD Debridement Type: Debridement Severity of Tissue Pre Debridement: Fat layer exposed Level of Consciousness (Pre-procedure): Awake and Alert Pre-procedure Verification/Time Out Yes - 08:32 Taken: Start Time: 08:32 Pain Control: Other : Benzocaine 20% T Area Debrided (L x W): otal 2.5 (cm) x 1 (cm) = 2.5 (cm) Tissue and other material debrided: Non-Viable, Slough, Subcutaneous, Slough Level: Skin/Subcutaneous Tissue Debridement Description: Excisional Instrument: Blade, Forceps Specimen: Tissue Culture Number of Specimens T aken: 1 Bleeding: Large Hemostasis Achieved:  Pressure End Time: 08:34 Procedural Pain: 3 Post Procedural Pain: 2 Response to Treatment: Procedure was tolerated well Level of Consciousness (Post- Awake and Alert procedure): Post Debridement Measurements of Total Wound Length: (cm) 2.5 Width: (cm) 1 Depth: (cm) 2 Volume: (cm) 3.927 Character of Wound/Ulcer Post Debridement: Improved Severity of Tissue Post Debridement: Fat layer exposed Post Procedure Diagnosis Same as Pre-procedure Electronic Signature(s) Signed: 02/08/2021 4:28:28 PM By: Baltazar Najjarobson, Phyllis Abelson MD Signed: 02/09/2021 5:37:29 PM By: Fonnie MuBreedlove, Lauren RN Entered By: Baltazar Najjarobson, Moranda Billiot on 02/08/2021 08:46:13 -------------------------------------------------------------------------------- HPI Details Patient Name: Date of Service: [name], Grainne 02/08/2021 7:30 A M Medical Record Number: [id number] Patient Account Number: [id number] Date of Birth/Sex: Treating RN: 08/16/1945 (76 y.o. Ardis RowanF) Breedlove, Lauren Primary Care Provider: Jarold MottoWorley, Samantha Other Clinician: Referring Provider: Treating Provider/Extender: Montey Horaobson, Anamae Rochelle Worley, Samantha Weeks in Treatment: 0 History of Present Illness HPI Description: ADMISSION 02/08/2021 This is a 76 year old woman who suffered a fall in early December while going down stairs.. She was seen in the ER. She was given Rocephin and Keflex. She was also followed up in the ER several days later. A DVT rule out was negative. On 1/9 she started following up with her primary doctor. Notable for the fact that she was having some bleeding. She was continued on Keflex on 1/12 using an Ace wrap. She was referred to our clinic for review. She is not on any anticoagulants although she is on aspirin. She has a history [place] vein [place] She tells me she did wear compression socks the first week that she had this done and more recently has been using Ace wraps. Past medical history includes congestive heart failure,  psoriasis, arthritis, right total knee replacement, vein stripping as noted. She also has macular degeneration. The wound is on her right lateral lower extremity. Her ABIs were 1.1 in our clinic Electronic Signature(s) Signed: 02/08/2021 4:28:28 PM By: Baltazar Najjarobson, Amrom Ore MD Entered By: Baltazar Najjarobson, Jamiria Langill on 02/08/2021 08:50:43 -------------------------------------------------------------------------------- Physical Exam Details Patient Name: Date of  Service: Earle GellGA RNER, Novalee 02/08/2021 7:30 A M Medical Record Number: [id number] Patient Account Number: [id number] Date of Birth/Sex: Treating RN: 07/18/1945 (76 y.o. Toniann FailF) Breedlove, Lauren Primary Care Provider: Jarold MottoWorley, Samantha Other Clinician: Referring Provider: Treating Provider/Extender: Montey Horaobson, Anetha Slagel Worley, Samantha Weeks in Treatment: 0 Constitutional Patient is hypertensive.. Pulse regular and within target range for patient.Marland Kitchen. Respirations regular, non-labored and within target range.. Temperature is normal and within the target range for the patient.Marland Kitchen. Appears in no distress. Cardiovascular Pedal pulses are palpable on the right. Mild edema of the right lower leg secondary to chronic venous insufficiency. Notes Wound exam; the patient had a small oval wound on the right lateral lower extremity. Unfortunately the skin over this was necrotic. Also notable swelling around this area but no particular redness tenderness or purulence. Using pickups and a #15 scalpel I remove note necrotic tissue over the small wound area. I removed a moderate to large amount of old blood. Because of the concerning history of cellulitis and this I went ahead and did a culture but I do not think any additional antibiotics are indicated for the moment Electronic Signature(s) Signed: 02/08/2021 4:28:28 PM By: Baltazar Najjarobson, Zannie Locastro MD Entered By: Baltazar Najjarobson, Eilah Common on 02/08/2021 08:52:27 -------------------------------------------------------------------------------- Physician  Orders Details Patient Name: Date of Service: Earle GellGA RNER, Prim 02/08/2021 7:30 A M Medical Record Number: [id number] Patient Account Number: [id number] Date of Birth/Sex: Treating RN: 05/25/1945 (76 y.o. Katrinka BlazingF) Scotton, Joanne Primary Care Provider: Jarold MottoWorley, Samantha Other Clinician: Referring Provider: Treating Provider/Extender: Montey Horaobson, Milissa Fesperman Worley, Samantha Weeks in Treatment: 0 Verbal / Phone Orders: No Diagnosis Coding Follow-up Appointments ppointment in 1 week. - Dr Leanord Hawkingobson Return A Bathing/ Shower/ Hygiene Other Bathing/Shower/Hygiene Orders/Instructions: - Use leg protective cover. The protective cover can be purchased this from [facility] or [facility] (approximately $20). Other name for protective cover is a cast protector. Edema Control - Lymphedema / SCD / Other Right Lower Extremity void standing for long periods of time. - Elevate legs throughout the day A Off-Loading Wound #1 Right,Lateral Lower Leg Other: - Rest Right leg as needed Wound Treatment Wound #1 - Lower Leg Wound Laterality: Right, Lateral Cleanser: Soap and Water Discharge Instructions: cover right leg. May shower. Prim Dressing: KerraCel Ag Gelling Fiber Dressing, 4x5 in (silver alginate) ary Discharge Instructions: Apply silver alginate to wound bed as instructed Secondary Dressing: Woven Gauze Sponge, Non-Sterile 4x4 in Discharge Instructions: Apply over primary dressing as directed. Secondary Dressing: Zetuvit Plus 4x8 in Discharge Instructions: Apply over primary dressing as directed. Compression Wrap: ThreePress (3 layer compression wrap) Discharge Instructions: Apply three layer compression as directed. Laboratory naerobe culture (MICRO) - Tissue culture of Right Lower Leg Bacteria identified in Unspecified specimen by A LOINC Code: 635-3 Convenience Name: Anerobic culture Electronic Signature(s) Signed: 02/08/2021 4:28:28 PM By: Baltazar Najjarobson, Chidinma Clites MD Signed: 02/08/2021 5:52:04 PM By: Karie SchwalbeScotton, Joanne  RN Entered By: Karie SchwalbeScotton, Joanne on 02/08/2021 08:57:24 -------------------------------------------------------------------------------- Problem List Details Patient Name: Date of Service: Earle GellGA RNER, Gayanne 02/08/2021 7:30 A M Medical Record Number: [id number] Patient Account Number: [id number] Date of Birth/Sex: Treating RN: 04/11/1945 (76 y.o. Ardis RowanF) Breedlove, Lauren Primary Care Provider: Jarold MottoWorley, Samantha Other Clinician: Referring Provider: Treating Provider/Extender: Montey Horaobson, Ji Feldner Worley, Samantha Weeks in Treatment: 0 Active Problems ICD-10 Encounter Code Description Active Date MDM Diagnosis S80.11XD Contusion of right lower leg, subsequent encounter 02/08/2021 No Yes L97.818 Non-pressure chronic ulcer of other part of right lower leg with other specified 02/08/2021 No Yes severity I87.301 Chronic venous hypertension (idiopathic) without complications of right lower 02/08/2021 No Yes extremity Inactive  Problems Resolved Problems Electronic Signature(s) Signed: 02/08/2021 4:28:28 PM By: Baltazar Najjar MD Entered By: Baltazar Najjar on 02/08/2021 08:45:33 -------------------------------------------------------------------------------- Progress Note Details Patient Name: Date of Service: [name], Nastassia 02/08/2021 7:30 A M Medical Record Number: [id number] Patient Account Number: [id number] Date of Birth/Sex: Treating RN: 08-Jun-1945 (76 y.o. Ardis Rowan, Lauren Primary Care Provider: [name] Other Clinician: Referring Provider: Treating Provider/Extender: Di Kindle, Algis Greenhouse in Treatment: 0 Subjective Chief Complaint Information obtained from Patient 02/08/2021; patient is here for review of a wound on the right lateral lower leg which was initially traumatic History of Present Illness (HPI) ADMISSION 02/08/2021 This is a 76 year old woman who suffered a fall in early December while going down stairs.. She was seen in the ER. She was given Rocephin and  Keflex. She was also followed up in the ER several days later. A DVT rule out was negative. On 1/9 she started following up with her primary doctor. Notable for the fact that she was having some bleeding. She was continued on Keflex on 1/12 using an Ace wrap. She was referred to our clinic for review. She is not on any anticoagulants although she is on aspirin. She has a history of vein ligation in 2005 when she was in [place]. She tells me she did wear compression socks the first week that she had this done and more recently has been using Ace wraps. Past medical history includes congestive heart failure, psoriasis, arthritis, right total knee replacement, vein stripping as noted. She also has macular degeneration. The wound is on her right lateral lower extremity. Her ABIs were 1.1 in our clinic Patient History Information obtained from Patient. Allergies No Known Allergies Social History Never smoker, Marital Status - Married, Alcohol Use - Never, Drug Use - No History, Caffeine Use - Rarely - coffee. Medical History Eyes Denies history of Cataracts, Glaucoma, Optic Neuritis Ear/Nose/Mouth/Throat Denies history of Chronic sinus problems/congestion, Middle ear problems Hematologic/Lymphatic Denies history of Anemia, Hemophilia, Human Immunodeficiency Virus, Lymphedema, Sickle Cell Disease Musculoskeletal Patient has history of Osteoarthritis Hospitalization/Surgery History - goiter. - IR kypho thoracic with bone biopsy. - joint replacement. - knee replacement. - total hip arthroplasty. - vein ligation and stripping. - cataract surgery. Medical A Surgical History Notes nd Integumentary (Skin) psoriasis Review of Systems (ROS) Constitutional Symptoms (General Health) Denies complaints or symptoms of Fatigue, Fever, Chills, Marked Weight Change. Eyes Denies complaints or symptoms of Dry Eyes, Vision Changes, Glasses / Contacts. Ear/Nose/Mouth/Throat Denies complaints or  symptoms of Chronic sinus problems or rhinitis. Respiratory Denies complaints or symptoms of Chronic or frequent coughs, Shortness of Breath. Cardiovascular Denies complaints or symptoms of Chest pain. Gastrointestinal Denies complaints or symptoms of Frequent diarrhea, Nausea, Vomiting. Endocrine Denies complaints or symptoms of Heat/cold intolerance. Genitourinary Denies complaints or symptoms of Frequent urination. Integumentary (Skin) Complains or has symptoms of Wounds - right leg. Neurologic Denies complaints or symptoms of Numbness/parasthesias. Psychiatric Denies complaints or symptoms of Claustrophobia, Suicidal. Objective Constitutional Patient is hypertensive.. Pulse regular and within target range for patient.Marland Kitchen Respirations regular, non-labored and within target range.. Temperature is normal and within the target range for the patient.Marland Kitchen Appears in no distress. Vitals Time Taken: 7:57 AM, Height: 68 in, Source: Stated, Weight: 200 lbs, Source: Stated, BMI: 30.4, Temperature: 98.4 F, Pulse: 74 bpm, Respiratory Rate: 17 breaths/min, Blood Pressure: 164/88 mmHg. Cardiovascular Pedal pulses are palpable on the right. Mild edema of the right lower leg secondary to chronic venous insufficiency. General Notes: Wound exam; the patient had a  small oval wound on the right lateral lower extremity. Unfortunately the skin over this was necrotic. Also notable swelling around this area but no particular redness tenderness or purulence. Using pickups and a #15 scalpel I remove note necrotic tissue over the small wound area. I removed a moderate to large amount of old blood. Because of the concerning history of cellulitis and this I went ahead and did a culture but I do not think any additional antibiotics are indicated for the moment Integumentary (Hair, Skin) Wound #1 status is Open. Original cause of wound was Trauma. The date acquired was: 12/15/2020. The wound is located on the  Right,Lateral Lower Leg. The wound measures 2.5cm length x 1cm width x 2cm depth; 1.963cm^2 area and 3.927cm^3 volume. There is Fat Layer (Subcutaneous Tissue) exposed. There is no tunneling noted, however, there is undermining starting at 12:00 and ending at 12:00 with a maximum distance of 2.7cm. There is a large amount of serosanguineous drainage noted. The wound margin is distinct with the outline attached to the wound base. There is small (1-33%) red, pink granulation within the wound bed. There is a large (67-100%) amount of necrotic tissue within the wound bed including Eschar. Assessment Active Problems ICD-10 Contusion of right lower leg, subsequent encounter Non-pressure chronic ulcer of other part of right lower leg with other specified severity Chronic venous hypertension (idiopathic) without complications of right lower extremity Procedures Wound #1 Pre-procedure diagnosis of Wound #1 is a Venous Leg Ulcer located on the Right,Lateral Lower Leg .Severity of Tissue Pre Debridement is: Fat layer exposed. There was a Excisional Skin/Subcutaneous Tissue Debridement with a total area of 2.5 sq cm performed by Maxwell Caul., MD. With the following instrument(s): Blade, and Forceps to remove Non-Viable tissue/material. Material removed includes Subcutaneous Tissue and Slough and after achieving pain control using Other (Benzocaine 20%). 1 specimen was taken by a Tissue Culture and sent to the lab per facility protocol. A time out was conducted at 08:32, prior to the start of the procedure. A Large amount of bleeding was controlled with Pressure. The procedure was tolerated well with a pain level of 3 throughout and a pain level of 2 following the procedure. Post Debridement Measurements: 2.5cm length x 1cm width x 2cm depth; 3.927cm^3 volume. Character of Wound/Ulcer Post Debridement is improved. Severity of Tissue Post Debridement is: Fat layer exposed. Post procedure Diagnosis Wound  #1: Same as Pre-Procedure Plan 1. The patient has a small wound on the right lateral lower leg unfortunately there is 2 cm of direct depth and 2.7 cm of circumferential undermining maximal at 12:00 2. There was no purulence but because of the concerning history from the ED and primary care I did go ahead and culture this. Hematomas can sometimes become secondarily infected. She has not been on antibiotics for a week after her second round of Keflex was stopped. I did not order additional antibiotics at this point there was no obvious acute surrounding infection 3. The patient does have chronic venous insufficiency and history of vein ligation on this side. She is definitely going to need to be put in compression. 4. Wounds like this are sometimes difficult to heal. If we cannot get this to progress towards closure using standard dressings and compression it is possible she will require a wound VAC Electronic Signature(s) Signed: 02/08/2021 4:28:28 PM By: Baltazar Najjar MD Entered By: Baltazar Najjar on 02/08/2021 08:54:18 -------------------------------------------------------------------------------- HxROS Details Patient Name: Date of Service: [name], Raimi 02/08/2021 7:30 A M  Medical Record Number: [id number] Patient Account Number: [id number] Date of Birth/Sex: Treating RN: July 30, 1945 (76 y.o. Ardis Rowan, Lauren Primary Care Provider: [name] Other Clinician: Referring Provider: Treating Provider/Extender: Montey Hora in Treatment: 0 Information Obtained From Patient Constitutional Symptoms (General Health) Complaints and Symptoms: Negative for: Fatigue; Fever; Chills; Marked Weight Change Eyes Complaints and Symptoms: Negative for: Dry Eyes; Vision Changes; Glasses / Contacts Medical History: Negative for: Cataracts; Glaucoma; Optic Neuritis Ear/Nose/Mouth/Throat Complaints and Symptoms: Negative for: Chronic sinus problems or  rhinitis Medical History: Negative for: Chronic sinus problems/congestion; Middle ear problems Respiratory Complaints and Symptoms: Negative for: Chronic or frequent coughs; Shortness of Breath Cardiovascular Complaints and Symptoms: Negative for: Chest pain Gastrointestinal Complaints and Symptoms: Negative for: Frequent diarrhea; Nausea; Vomiting Endocrine Complaints and Symptoms: Negative for: Heat/cold intolerance Genitourinary Complaints and Symptoms: Negative for: Frequent urination Integumentary (Skin) Complaints and Symptoms: Positive for: Wounds - right leg Medical History: Past Medical History Notes: psoriasis Neurologic Complaints and Symptoms: Negative for: Numbness/parasthesias Psychiatric Complaints and Symptoms: Negative for: Claustrophobia; Suicidal Hematologic/Lymphatic Medical History: Negative for: Anemia; Hemophilia; Human Immunodeficiency Virus; Lymphedema; Sickle Cell Disease Immunological Musculoskeletal Medical History: Positive for: Osteoarthritis Oncologic Immunizations Pneumococcal Vaccine: Received Pneumococcal Vaccination: Yes Received Pneumococcal Vaccination On or After 60th Birthday: Yes Implantable Devices No devices added Hospitalization / Surgery History Type of Hospitalization/Surgery goiter IR kypho thoracic with bone biopsy joint replacement knee replacement total hip arthroplasty vein ligation and stripping cataract surgery Family and Social History Never smoker; Marital Status - Married; Alcohol Use: Never; Drug Use: No History; Caffeine Use: Rarely - coffee; Financial Concerns: No; Food, Clothing or Shelter Needs: No; Support System Lacking: No; Transportation Concerns: No Electronic Signature(s) Signed: 02/08/2021 4:28:28 PM By: Baltazar Najjar MD Signed: 02/09/2021 5:37:29 PM By: Fonnie Mu RN Entered By: Fonnie Mu on 02/08/2021  08:03:33 -------------------------------------------------------------------------------- SuperBill Details Patient Name: Date of Service: [name], Lyndel 02/08/2021 Medical Record Number: [id number] Patient Account Number: [id number] Date of Birth/Sex: Treating RN: January 17, 1945 (76 y.o. Ardis Rowan, Lauren Primary Care Provider: [name] Other Clinician: Referring Provider: Treating Provider/Extender: Montey Hora in Treatment: 0 Diagnosis Coding ICD-10 Codes Code Description S80.11XD Contusion of right lower leg, subsequent encounter L97.818 Non-pressure chronic ulcer of other part of right lower leg with other specified severity I87.301 Chronic venous hypertension (idiopathic) without complications of right lower extremity Facility Procedures CPT4 Code: 80034917 Description: 99213 - WOUND CARE VISIT-LEV 3 EST PT Modifier: 25 Quantity: 1 CPT4 Code: [id number] Description: 11042 - DEB SUBQ TISSUE 20 SQ CM/< ICD-10 Diagnosis Description L97.818 Non-pressure chronic ulcer of other part of right lower leg with other specified Modifier: severity Quantity: 1 Physician Procedures : CPT4 Code Description Modifier [id number] WC PHYS LEVEL 3 NEW PT 25 ICD-10 Diagnosis Description S80.11XD Contusion of right lower leg, subsequent encounter L97.818 Non-pressure chronic ulcer of other part of right lower leg with other specified severity  I87.301 Chronic venous hypertension (idiopathic) without complications of right lower extremity Quantity: 1 Electronic Signature(s) Signed: 02/08/2021 5:52:04 PM By: Karie Schwalbe RN Signed: 02/09/2021 6:04:31 PM By: Baltazar Najjar MD Previous Signature: 02/08/2021 4:28:28 PM Version By: Baltazar Najjar MD Entered By: Karie Schwalbe on 02/08/2021 16:48:05

## 2021-02-09 NOTE — Progress Notes (Signed)
Adriance, Shanay ([id number]) Visit Report for 02/08/2021 Abuse Risk Screen Details Patient Name: Date of Service: [name], [name] 02/08/2021 7:30 A M Medical Record Number: [id number] Patient Account Number: [id number] Date of Birth/Sex: Treating RN: 1945/10/14 (76 y.o. Ardis Rowan, Lauren Primary Care Dina Warbington: [name] Other Clinician: Referring Marquavius Scaife: Treating Kariyah Baugh/Extender: Montey Hora in Treatment: 0 Abuse Risk Screen Items Answer ABUSE RISK SCREEN: Has anyone close to you tried to hurt or harm you recentlyo No Do you feel uncomfortable with anyone in your familyo No Has anyone forced you do things that you didnt want to doo No Electronic Signature(s) Signed: 02/09/2021 5:37:29 PM By: Fonnie Mu RN Entered By: Fonnie Mu on 02/08/2021 07:52:49 -------------------------------------------------------------------------------- Activities of Daily Living Details Patient Name: Date of Service: [name], [name] 02/08/2021 7:30 A M Medical Record Number: [id number] Patient Account Number: [id number] Date of Birth/Sex: Treating RN: 12/04/1945 (76 y.o. Ardis Rowan, Lauren Primary Care Pavlos Yon: [name] Other Clinician: Referring Taeden Geller: Treating Marlie Kuennen/Extender: Montey Hora in Treatment: 0 Activities of Daily Living Items Answer Activities of Daily Living (Please select one for each item) Drive Automobile Completely Able T Medications ake Completely Able Use T elephone Completely Able Care for Appearance Completely Able Use T oilet Completely Able Bath / Shower Completely Able Dress Self Completely Able Feed Self Completely Able Walk Completely Able Get In / Out Bed Completely Able Housework Completely Able Prepare Meals Completely Able Handle Money Completely Able Shop for Self Completely Able Electronic Signature(s) Signed: 02/09/2021 5:37:29 PM By: Fonnie Mu RN Entered By:  Fonnie Mu on 02/08/2021 08:04:02 -------------------------------------------------------------------------------- Education Screening Details Patient Name: Date of Service: [name], [name] 02/08/2021 7:30 A M Medical Record Number: [id number] Patient Account Number: [id number] Date of Birth/Sex: Treating RN: 08-Aug-1945 (76 y.o. Ardis Rowan, Lauren Primary Care Trannie Bardales: [name] Other Clinician: Referring Medardo Hassing: Treating Caisen Mangas/Extender: Montey Hora in Treatment: 0 Primary Learner Assessed: Patient Learning Preferences/Education Level/Primary Language Learning Preference: Explanation, Demonstration, Communication Board, Printed Material Highest Education Level: College or Above Preferred Language: English Cognitive Barrier Language Barrier: No Translator Needed: No Memory Deficit: No Emotional Barrier: No Cultural/Religious Beliefs Affecting Medical Care: No Physical Barrier Impaired Vision: No Impaired Hearing: No Decreased Hand dexterity: No Knowledge/Comprehension Knowledge Level: High Comprehension Level: High Ability to understand written instructions: High Ability to understand verbal instructions: High Motivation Anxiety Level: Calm Cooperation: Cooperative Education Importance: Denies Need Interest in Health Problems: Asks Questions Perception: Coherent Willingness to Engage in Self-Management High Activities: Readiness to Engage in Self-Management High Activities: Electronic Signature(s) Signed: 02/09/2021 5:37:29 PM By: Fonnie Mu RN Entered By: Fonnie Mu on 02/08/2021 08:04:11 -------------------------------------------------------------------------------- Fall Risk Assessment Details Patient Name: Date of Service: [name], [name] 02/08/2021 7:30 A M Medical Record Number: [id number] Patient Account Number: [id number] Date of Birth/Sex: Treating RN: 04-05-1945 (75 y.o. Ardis Rowan,  Lauren Primary Care Doil Kamara: [name] Other Clinician: Referring Jaycee Mckellips: Treating Anastasios Melander/Extender: Montey Hora in Treatment: 0 Fall Risk Assessment Items Have you had 2 or more falls in the last 12 monthso 0 No Have you had any fall that resulted in injury in the last 12 monthso 0 Yes FALLS RISK SCREEN History of falling - immediate or within 3 months 25 Yes Secondary diagnosis (Do you have 2 or more medical diagnoseso) 0 No Ambulatory aid None/bed rest/wheelchair/nurse 0 No Crutches/cane/walker 0 No Furniture 0 No Intravenous therapy Access/Saline/Heparin Lock 0 No Gait/Transferring Normal/ bed rest/ wheelchair 0 Yes Weak (short steps with or  without shuffle, stooped but able to lift head while walking, may seek 0 No support from furniture) Impaired (short steps with shuffle, may have difficulty arising from chair, head down, impaired 0 No balance) Mental Status Oriented to own ability 0 Yes Electronic Signature(s) Signed: 02/09/2021 5:37:29 PM By: Fonnie Mu RN Entered By: Fonnie Mu on 02/08/2021 07:55:58 -------------------------------------------------------------------------------- Foot Assessment Details Patient Name: Date of Service: [name], [name] 02/08/2021 7:30 A M Medical Record Number: [id number] Patient Account Number: [id number] Date of Birth/Sex: Treating RN: 1945-07-22 (76 y.o. Ardis Rowan, Lauren Primary Care Gift Rueckert: [name] Other Clinician: Referring Sani Loiseau: Treating Jamiah Recore/Extender: Montey Hora in Treatment: 0 Foot Assessment Items Site Locations + = Sensation present, - = Sensation absent, C = Callus, U = Ulcer R = Redness, W = Warmth, M = Maceration, PU = Pre-ulcerative lesion F = Fissure, S = Swelling, D = Dryness Assessment Right: Left: Other Deformity: No No Prior Foot Ulcer: No No Prior Amputation: No No Charcot Joint: No No Ambulatory Status:  Ambulatory Without Help Gait: Steady Electronic Signature(s) Signed: 02/09/2021 5:37:29 PM By: Fonnie Mu RN Entered By: Fonnie Mu on 02/08/2021 08:15:14 -------------------------------------------------------------------------------- Nutrition Risk Screening Details Patient Name: Date of Service: [name], [name] 02/08/2021 7:30 A M Medical Record Number: [id number] Patient Account Number: [id number] Date of Birth/Sex: Treating RN: 23-Jun-1945 (76 y.o. Toniann Fail Primary Care Gerard Bonus: [name] Other Clinician: Referring Dontrel Smethers: Treating Charlsie Fleeger/Extender: Montey Hora in Treatment: 0 Height (in): Weight (lbs): Body Mass Index (BMI): Nutrition Risk Screening Items Score Screening NUTRITION RISK SCREEN: I have an illness or condition that made me change the kind and/or amount of food I eat 0 No I eat fewer than two meals per day 0 No I eat few fruits and vegetables, or milk products 0 No I have three or more drinks of beer, liquor or wine almost every day 0 No I have tooth or mouth problems that make it hard for me to eat 0 No I don't always have enough money to buy the food I need 0 No I eat alone most of the time 0 No I take three or more different prescribed or over-the-counter drugs a day 0 No Without wanting to, I have lost or gained 10 pounds in the last six months 0 No I am not always physically able to shop, cook and/or feed myself 0 No Nutrition Protocols Good Risk Protocol 0 No interventions needed Moderate Risk Protocol High Risk Proctocol Risk Level: Good Risk Score: 0 Electronic Signature(s) Signed: 02/09/2021 5:37:29 PM By: Fonnie Mu RN Entered By: Fonnie Mu on 02/08/2021 07:56:05

## 2021-02-15 ENCOUNTER — Other Ambulatory Visit: Payer: Self-pay

## 2021-02-15 ENCOUNTER — Encounter (HOSPITAL_BASED_OUTPATIENT_CLINIC_OR_DEPARTMENT_OTHER): Payer: Medicare Other | Attending: Internal Medicine | Admitting: Internal Medicine

## 2021-02-15 DIAGNOSIS — L97818 Non-pressure chronic ulcer of other part of right lower leg with other specified severity: Secondary | ICD-10-CM | POA: Diagnosis not present

## 2021-02-15 DIAGNOSIS — M1990 Unspecified osteoarthritis, unspecified site: Secondary | ICD-10-CM | POA: Insufficient documentation

## 2021-02-15 DIAGNOSIS — Z96651 Presence of right artificial knee joint: Secondary | ICD-10-CM | POA: Diagnosis not present

## 2021-02-15 DIAGNOSIS — I87301 Chronic venous hypertension (idiopathic) without complications of right lower extremity: Secondary | ICD-10-CM | POA: Diagnosis not present

## 2021-02-15 DIAGNOSIS — H3530 Unspecified macular degeneration: Secondary | ICD-10-CM | POA: Diagnosis not present

## 2021-02-15 DIAGNOSIS — I509 Heart failure, unspecified: Secondary | ICD-10-CM | POA: Insufficient documentation

## 2021-02-15 DIAGNOSIS — S8011XA Contusion of right lower leg, initial encounter: Secondary | ICD-10-CM | POA: Diagnosis present

## 2021-02-15 DIAGNOSIS — W109XXA Fall (on) (from) unspecified stairs and steps, initial encounter: Secondary | ICD-10-CM | POA: Insufficient documentation

## 2021-02-15 DIAGNOSIS — L409 Psoriasis, unspecified: Secondary | ICD-10-CM | POA: Diagnosis not present

## 2021-02-15 NOTE — Progress Notes (Signed)
[name] ([id number]) Visit Report for 02/15/2021 Arrival Information Details Patient Name: Date of Service: [name], [name] 02/15/2021 9:15 A M Medical Record Number: [id number] Patient Account Number: [id number] Date of Birth/Sex: Treating RN: 12-13-1945 (76 y.o. [name], [name] Primary Care Antwine Agosto: [name] Other Clinician: Referring Erik Burkett: Treating Deborah Lazcano/Extender: Montey Hora in Treatment: 1 Visit Information History Since Last Visit Added or deleted any medications: No Patient Arrived: Ambulatory Any new allergies or adverse reactions: No Arrival Time: 09:48 Had a fall or experienced change in No Accompanied By: self activities of daily living that may affect Transfer Assistance: None risk of falls: Patient Identification Verified: Yes Signs or symptoms of abuse/neglect since last visito No Secondary Verification Process Completed: Yes Hospitalized since last visit: No Patient Requires Transmission-Based Precautions: No Implantable device outside of the clinic excluding No Patient Has Alerts: No cellular tissue based products placed in the center since last visit: Has Dressing in Place as Prescribed: Yes Has Compression in Place as Prescribed: Yes Pain Present Now: No Electronic Signature(s) Signed: 02/15/2021 4:37:24 PM By: Fonnie Mu RN Entered By: Fonnie Mu on 02/15/2021 09:48:58 -------------------------------------------------------------------------------- Compression Therapy Details Patient Name: Date of Service: [name] 02/15/2021 9:15 A M Medical Record Number: [id number] Patient Account Number: [id number] Date of Birth/Sex: Treating RN: [birth year] (76 y.o. [name], [name] Primary Care Starsha Morning: [name] Other Clinician: Referring Chrisangel Eskenazi: Treating Sajid Ruppert/Extender: Montey Hora in Treatment: 1 Compression Therapy Performed for Wound Assessment: Wound #1  Right,Lateral Lower Leg Performed By: Clinician Fonnie Mu, RN Compression Type: Three Layer Post Procedure Diagnosis Same as Pre-procedure Electronic Signature(s) Signed: 02/15/2021 4:37:24 PM By: Fonnie Mu RN Entered By: Fonnie Mu on 02/15/2021 10:04:07 -------------------------------------------------------------------------------- Encounter Discharge Information Details Patient Name: Date of Service: [name] RNER, [name] 02/15/2021 9:15 A M Medical Record Number: [id number] Patient Account Number: [id number] Date of Birth/Sex: Treating RN: Jul 11, 1945 (76 y.o. [name], [name] Primary Care Althea Backs: [name] Other Clinician: Referring Dalaney Needle: Treating Rashon Rezek/Extender: Montey Hora in Treatment: 1 Encounter Discharge Information Items Discharge Condition: Stable Ambulatory Status: Ambulatory Discharge Destination: Home Transportation: Private Auto Accompanied By: self Schedule Follow-up Appointment: Yes Clinical Summary of Care: Patient Declined Electronic Signature(s) Signed: 02/15/2021 4:37:24 PM By: Fonnie Mu RN Entered By: Fonnie Mu on 02/15/2021 10:05:07 -------------------------------------------------------------------------------- Lower Extremity Assessment Details Patient Name: Date of Service: [name] RNER, [name] 02/15/2021 9:15 A M Medical Record Number: [id number] Patient Account Number: [id number] Date of Birth/Sex: Treating RN: 02/11/1945 (76 y.o. [name], [name] Primary Care Jahni Nazar: [name] Other Clinician: Referring Raigen Jagielski: Treating Miraj Truss/Extender: Danny Lawless Weeks in Treatment: 1 Edema Assessment Assessed: [Left: No] [Right: Yes] Edema: [Left: Ye] [Right: s] Calf Left: Right: Point of Measurement: 33 cm From Medial Instep 40 cm Ankle Left: Right: Point of Measurement: 7 cm From Medial Instep 24 cm Vascular Assessment Pulses: Dorsalis  Pedis Palpable: [Right:Yes] Posterior Tibial Palpable: [Right:Yes] Electronic Signature(s) Signed: 02/15/2021 4:37:24 PM By: Fonnie Mu RN Entered By: Fonnie Mu on 02/15/2021 09:49:50 -------------------------------------------------------------------------------- Multi Wound Chart Details Patient Name: Date of Service: [name], [name] 02/15/2021 9:15 A M Medical Record Number: [id number] Patient Account Number: [id number] Date of Birth/Sex: Treating RN: 1945-09-08 (76 y.o. [name], [name] Primary Care Betsi Crespi: [name] Other Clinician: Referring Lakyra Tippins: Treating Esmeralda Blanford/Extender: Montey Hora in Treatment: 1 Vital Signs Height(in): 68 Pulse(bpm): 74 Weight(lbs): 200 Blood Pressure(mmHg): 152/74 Body Mass Index(BMI): 30.4 Temperature(F): 98.7 Respiratory Rate(breaths/min): 17 Photos: [N/A:N/A] Right, Lateral Lower Leg N/A N/A  Wound Location: Trauma N/A N/A Wounding Event: Venous Leg Ulcer N/A N/A Primary Etiology: Osteoarthritis N/A N/A Comorbid History: 12/15/2020 N/A N/A Date Acquired: 1 N/A N/A Weeks of Treatment: Open N/A N/A Wound Status: No N/A N/A Wound Recurrence: 1.7x1.5x1.7 N/A N/A Measurements L x W x D (cm) 2.003 N/A N/A A (cm) : rea 3.405 N/A N/A Volume (cm) : -2.00% N/A N/A % Reduction in A rea: 13.30% N/A N/A % Reduction in Volume: 12 Starting Position 1 (o'clock): 12 Ending Position 1 (o'clock): 6.5 Maximum Distance 1 (cm): Yes N/A N/A Undermining: Full Thickness With Exposed Support N/A N/A Classification: Structures Large N/A N/A Exudate A mount: Serosanguineous N/A N/A Exudate Type: red, brown N/A N/A Exudate Color: Distinct, outline attached N/A N/A Wound Margin: Small (1-33%) N/A N/A Granulation A mount: Red, Pink N/A N/A Granulation Quality: Large (67-100%) N/A N/A Necrotic A mount: Eschar N/A N/A Necrotic Tissue: Fat Layer (Subcutaneous Tissue): Yes N/A  N/A Exposed Structures: Fascia: No Tendon: No Muscle: No Joint: No Bone: No None N/A N/A Epithelialization: Debridement - Excisional N/A N/A Debridement: Pre-procedure Verification/Time Out 10:15 N/A N/A Taken: Lidocaine N/A N/A Pain Control: Subcutaneous, Slough N/A N/A Tissue Debrided: Skin/Subcutaneous Tissue N/A N/A Level: 2.55 N/A N/A Debridement A (sq cm): rea Curette N/A N/A Instrument: Minimum N/A N/A Bleeding: Pressure N/A N/A Hemostasis A chieved: 0 N/A N/A Procedural Pain: 0 N/A N/A Post Procedural Pain: Procedure was tolerated well N/A N/A Debridement Treatment Response: Post Debridement Measurements L x 1.7x1.5x1.7 N/A N/A W x D (cm) 3.405 N/A N/A Post Debridement Volume: (cm) Compression Therapy N/A N/A Procedures Performed: Debridement Treatment Notes Wound #1 (Lower Leg) Wound Laterality: Right, Lateral Cleanser Soap and Water Discharge Instruction: cover right leg. May shower. Peri-Wound Care Topical Primary Dressing KerraCel Ag Gelling Fiber Dressing, 4x5 in (silver alginate) Discharge Instruction: Apply silver alginate to wound bed as instructed Secondary Dressing Woven Gauze Sponge, Non-Sterile 4x4 in Discharge Instruction: Apply over primary dressing as directed. Zetuvit Plus 4x8 in Discharge Instruction: Apply over primary dressing as directed. Secured With Compression Wrap ThreePress (3 layer compression wrap) Discharge Instruction: Apply three layer compression as directed. Compression Stockings Add-Ons Electronic Signature(s) Signed: 02/15/2021 4:31:34 PM By: Baltazar Najjar MD Signed: 02/15/2021 4:37:24 PM By: Fonnie Mu RN Entered By: Baltazar Najjar on 02/15/2021 10:44:41 -------------------------------------------------------------------------------- Multi-Disciplinary Care Plan Details Patient Name: Date of Service: [name], Ruthann 02/15/2021 9:15 A M Medical Record Number: [id number] Patient Account Number:  [id number] Date of Birth/Sex: Treating RN: 1945/05/31 (76 y.o. [name], [name] Primary Care Hosanna Betley: [name] Other Clinician: Referring Kary Colaizzi: Treating Evalynne Locurto/Extender: Montey Hora in Treatment: 1 Active Inactive Wound/Skin Impairment Nursing Diagnoses: Impaired tissue integrity Goals: Patient/caregiver will verbalize understanding of skin care regimen Date Initiated: 02/08/2021 Target Resolution Date: 03/10/2021 Goal Status: Active Interventions: Assess ulceration(s) every visit Notes: Electronic Signature(s) Signed: 02/15/2021 4:37:24 PM By: Fonnie Mu RN Entered By: Fonnie Mu on 02/15/2021 10:03:17 -------------------------------------------------------------------------------- Pain Assessment Details Patient Name: Date of Service: [name], Brena 02/15/2021 9:15 A M Medical Record Number: [id number] Patient Account Number: [id number] Date of Birth/Sex: Treating RN: 26-Aug-1945 (76 y.o. [name], [name] Primary Care Zafirah Vanzee: [name] Other Clinician: Referring Nitara Szczerba: Treating Renell Coaxum/Extender: Montey Hora in Treatment: 1 Active Problems Location of Pain Severity and Description of Pain Patient Has Paino No Site Locations Pain Management and Medication Current Pain Management: Electronic Signature(s) Signed: 02/15/2021 4:37:24 PM By: Fonnie Mu RN Entered By: Fonnie Mu on 02/15/2021 09:49:42 -------------------------------------------------------------------------------- Patient/Caregiver Education Details Patient  Name: Date of Service: Earle GellGA RNER, Nick 2/1/2023andnbsp9:15 A M Medical Record Number: [id number] Patient Account Number: [id number] Date of Birth/Gender: Treating RN: 09/14/1945 (76 y.o. [name] RowanF) Breedlove, [name] Primary Care Physician: Jarold MottoWorley, Samantha Other Clinician: Referring Physician: Treating Physician/Extender: Montey Horaobson, Michael Worley,  Samantha Weeks in Treatment: 1 Education Assessment Education Provided To: Patient Education Topics Provided Wound/Skin Impairment: Methods: Explain/Verbal Responses: State content correctly Electronic Signature(s) Signed: 02/15/2021 4:37:24 PM By: Fonnie MuBreedlove, Lauren RN Entered By: Fonnie MuBreedlove, [name] on 02/15/2021 10:03:37 -------------------------------------------------------------------------------- Wound Assessment Details Patient Name: Date of Service: [name] RNER, Sherian 02/15/2021 9:15 A M Medical Record Number: [id number] Patient Account Number: [id number] Date of Birth/Sex: Treating RN: 01/14/1946 (76 y.o. [name] RowanF) Breedlove, [name] Primary Care Kendarious Gudino: Jarold MottoWorley, Samantha Other Clinician: Referring Cleona Doubleday: Treating Yoon Barca/Extender: Danny Lawlessobson, Michael Worley, Samantha Weeks in Treatment: 1 Wound Status Wound Number: 1 Primary Etiology: Venous Leg Ulcer Wound Location: Right, Lateral Lower Leg Wound Status: Open Wounding Event: Trauma Comorbid History: Osteoarthritis Date Acquired: 12/15/2020 Weeks Of Treatment: 1 Clustered Wound: No Photos Wound Measurements Length: (cm) 1.7 Width: (cm) 1.5 Depth: (cm) 1.7 Area: (cm) 2.003 Volume: (cm) 3.405 % Reduction in Area: -2% % Reduction in Volume: 13.3% Epithelialization: None Tunneling: No Undermining: Yes Starting Position (o'clock): 12 Ending Position (o'clock): 12 Maximum Distance: (cm) 6.5 Wound Description Classification: Full Thickness With Exposed Support Structures Wound Margin: Distinct, outline attached Exudate Amount: Large Exudate Type: Serosanguineous Exudate Color: red, brown Foul Odor After Cleansing: No Slough/Fibrino Yes Wound Bed Granulation Amount: Small (1-33%) Exposed Structure Granulation Quality: Red, Pink Fascia Exposed: No Necrotic Amount: Large (67-100%) Fat Layer (Subcutaneous Tissue) Exposed: Yes Necrotic Quality: Eschar Tendon Exposed: No Muscle Exposed: No Joint Exposed: No Bone  Exposed: No Treatment Notes Wound #1 (Lower Leg) Wound Laterality: Right, Lateral Cleanser Soap and Water Discharge Instruction: cover right leg. May shower. Peri-Wound Care Topical Primary Dressing KerraCel Ag Gelling Fiber Dressing, 4x5 in (silver alginate) Discharge Instruction: Apply silver alginate to wound bed as instructed Secondary Dressing Woven Gauze Sponge, Non-Sterile 4x4 in Discharge Instruction: Apply over primary dressing as directed. Zetuvit Plus 4x8 in Discharge Instruction: Apply over primary dressing as directed. Secured With Compression Wrap ThreePress (3 layer compression wrap) Discharge Instruction: Apply three layer compression as directed. Compression Stockings Add-Ons Electronic Signature(s) Signed: 02/15/2021 4:37:24 PM By: Fonnie MuBreedlove, Lauren RN Entered By: Fonnie MuBreedlove, [name] on 02/15/2021 10:00:54 -------------------------------------------------------------------------------- Vitals Details Patient Name: Date of Service: [name] RNER, Cassadee 02/15/2021 9:15 A M Medical Record Number: [id number] Patient Account Number: [id number] Date of Birth/Sex: Treating RN: 10/04/1945 (76 y.o. [name] RowanF) Breedlove, [name] Primary Care Kohle Winner: Jarold MottoWorley, Samantha Other Clinician: Referring Everest Brod: Treating Arbie Blankley/Extender: Montey Horaobson, Michael Worley, Samantha Weeks in Treatment: 1 Vital Signs Time Taken: 09:49 Temperature (F): 98.7 Height (in): 68 Pulse (bpm): 74 Weight (lbs): 200 Respiratory Rate (breaths/min): 17 Body Mass Index (BMI): 30.4 Blood Pressure (mmHg): 152/74 Reference Range: 80 - 120 mg / dl Electronic Signature(s) Signed: 02/15/2021 4:37:24 PM By: Fonnie MuBreedlove, Lauren RN Entered By: Fonnie MuBreedlove, [name] on 02/15/2021 09:49:29

## 2021-02-15 NOTE — Progress Notes (Signed)
Tena, Helayna ([id number]) Visit Report for 02/15/2021 Debridement Details Patient Name: Date of Service: AUBREYROSE, RYSER 02/15/2021 9:15 A M Medical Record Number: [id number] Patient Account Number: [id number] Date of Birth/Sex: Treating RN: 11-13-1945 (76 y.o. Tonita Phoenix, Lauren Primary Care Provider: Inda Coke Other Clinician: Referring Provider: Treating Provider/Extender: Hortencia Pilar in Treatment: 1 Debridement Performed for Assessment: Wound #1 Right,Lateral Lower Leg Performed By: Physician Ricard Dillon., MD Debridement Type: Debridement Severity of Tissue Pre Debridement: Fat layer exposed Level of Consciousness (Pre-procedure): Awake and Alert Pre-procedure Verification/Time Out Yes - 10:15 Taken: Start Time: 10:15 Pain Control: Lidocaine T Area Debrided (L x W): otal 1.7 (cm) x 1.5 (cm) = 2.55 (cm) Tissue and other material debrided: Viable, Non-Viable, Slough, Subcutaneous, Skin: Dermis , Skin: Epidermis, Slough Level: Skin/Subcutaneous Tissue Debridement Description: Excisional Instrument: Curette Bleeding: Minimum Hemostasis Achieved: Pressure End Time: 10:15 Procedural Pain: 0 Post Procedural Pain: 0 Response to Treatment: Procedure was tolerated well Level of Consciousness (Post- Awake and Alert procedure): Post Debridement Measurements of Total Wound Length: (cm) 1.7 Width: (cm) 1.5 Depth: (cm) 1.7 Volume: (cm) 3.405 Character of Wound/Ulcer Post Debridement: Improved Severity of Tissue Post Debridement: Fat layer exposed Post Procedure Diagnosis Same as Pre-procedure Electronic Signature(s) Signed: 02/15/2021 4:31:34 PM By: Linton Ham MD Signed: 02/15/2021 4:37:24 PM By: Rhae Hammock RN Entered By: Linton Ham on 02/15/2021 10:46:46 -------------------------------------------------------------------------------- HPI Details Patient Name: Date of Service: [name], Melessia 02/15/2021 9:15 A M Medical Record  Number: [id number] Patient Account Number: [id number] Date of Birth/Sex: Treating RN: 26-Dec-1945 (77 y.o. Tonita Phoenix, Lauren Primary Care Provider: Inda Coke Other Clinician: Referring Provider: Treating Provider/Extender: Hortencia Pilar in Treatment: 1 History of Present Illness HPI Description: ADMISSION 02/08/2021 This is a 76 year old woman who suffered a fall in early December while going down stairs.. She was seen in the ER. She was given Rocephin and Keflex. She was also followed up in the ER several days later. A DVT rule out was negative. On 1/9 she started following up with her primary doctor. Notable for the fact that she was having some bleeding. She was continued on Keflex on 1/12 using an Ace wrap. She was referred to our clinic for review. She is not on any anticoagulants although she is on aspirin. She has a history of vein ligation in 2005 when she was in [place]. She tells me she did wear compression socks the first week that she had this done and more recently has been using Ace wraps. Past medical history includes congestive heart failure, psoriasis, arthritis, right total knee replacement, vein stripping as noted. She also has macular degeneration. The wound is on her right lateral lower extremity. Her ABIs were 1.1 in our clinic 2/1; patient was admitted the clinic last week she had a contusion on the right lateral leg. It had fully evacuated for the most part when she came here. No real problem with this is not just the physical depth but the undermining depth superiorly. Culture I did of this last week is still pending but obviously did not grow the standard worrisome organisms including staph strep and the common gram-negative's. I did not give her antibiotics. We have been using silver alginate. Electronic Signature(s) Signed: 02/15/2021 4:31:34 PM By: Linton Ham MD Entered By: Linton Ham on 02/15/2021  10:49:17 -------------------------------------------------------------------------------- Physical Exam Details Patient Name: Date of Service: [name], [place] 02/15/2021 9:15 A M Medical Record Number: [id number] Patient Account Number: [id number] Date of Birth/Sex: Treating RN:  Apr 17, 1945 (76 y.o. Benjaman Lobe Primary Care Provider: Inda Coke Other Clinician: Referring Provider: Treating Provider/Extender: Hortencia Pilar in Treatment: 1 Constitutional Patient is hypertensive.. Pulse regular and within target range for patient.Marland Kitchen Respirations regular, non-labored and within target range.. Temperature is normal and within the target range for the patient.Marland Kitchen Appears in no distress. Notes Wound exam; the wound in terms of length and width is a small oval-shaped wound on the right lateral leg. It has significant direct depth and more problematically significant undermining. There was retained silver alginate in the wound which I removed and then I used a #5 curette to remove necrotic surface debris this is deep and probably approaches the deep fascial layers of her calf. There is no obvious purulence no surrounding cellulitis Electronic Signature(s) Signed: 02/15/2021 4:31:34 PM By: Linton Ham MD Entered By: Linton Ham on 02/15/2021 10:50:26 -------------------------------------------------------------------------------- Physician Orders Details Patient Name: Date of Service: [name], [place] 02/15/2021 9:15 A M Medical Record Number: [id number] Patient Account Number: [id number] Date of Birth/Sex: Treating RN: [birth year] (76 y.o. Benjaman Lobe Primary Care Provider: Inda Coke Other Clinician: Referring Provider: Treating Provider/Extender: Hortencia Pilar in Treatment: 1 Verbal / Phone Orders: No Diagnosis Coding Follow-up Appointments ppointment in 1 week. - Dr Dellia Nims Return A Bathing/ Shower/ Hygiene Other  Bathing/Shower/Hygiene Orders/Instructions: - Use leg protective cover. The protective cover can be purchased this from [facility] or [facility] (approximately $20). Other name for protective cover is a cast protector. Negative Presssure Wound Therapy Other: - We are running your insurance for a wound vac. We are not ordering it yet though!! Edema Control - Lymphedema / SCD / Other Right Lower Extremity void standing for long periods of time. - Elevate legs throughout the day A Off-Loading Wound #1 Right,Lateral Lower Leg Other: - Rest Right leg as needed Wound Treatment Wound #1 - Lower Leg Wound Laterality: Right, Lateral Cleanser: Soap and Water Discharge Instructions: cover right leg. May shower. Prim Dressing: KerraCel Ag Gelling Fiber Dressing, 4x5 in (silver alginate) ary Discharge Instructions: Apply silver alginate to wound bed as instructed Secondary Dressing: Woven Gauze Sponge, Non-Sterile 4x4 in Discharge Instructions: Apply over primary dressing as directed. Secondary Dressing: Zetuvit Plus 4x8 in Discharge Instructions: Apply over primary dressing as directed. Compression Wrap: ThreePress (3 layer compression wrap) Discharge Instructions: Apply three layer compression as directed. Electronic Signature(s) Signed: 02/15/2021 4:31:34 PM By: Linton Ham MD Signed: 02/15/2021 4:37:24 PM By: Rhae Hammock RN Entered By: Rhae Hammock on 02/15/2021 10:20:14 -------------------------------------------------------------------------------- Problem List Details Patient Name: Date of Service: [name], [place]matullah 02/15/2021 9:15 A M Medical Record Number: [id number] Patient Account Number: [id number] Date of Birth/Sex: Treating RN: [birth year] (76 y.o. Tonita Phoenix, Lauren Primary Care Provider: Inda Coke Other Clinician: Referring Provider: Treating Provider/Extender: Hortencia Pilar in Treatment: 1 Active Problems ICD-10 Encounter Code Description  Active Date MDM Diagnosis S80.11XD Contusion of right lower leg, subsequent encounter 02/08/2021 No Yes L97.818 Non-pressure chronic ulcer of other part of right lower leg with other specified 02/08/2021 No Yes severity I87.301 Chronic venous hypertension (idiopathic) without complications of right lower 02/08/2021 No Yes extremity Inactive Problems Resolved Problems Electronic Signature(s) Signed: 02/15/2021 4:31:34 PM By: Linton Ham MD Entered By: Linton Ham on 02/15/2021 10:44:29 -------------------------------------------------------------------------------- Progress Note Details Patient Name: Date of Service: [name], [place] 02/15/2021 9:15 A M Medical Record Number: [id number] Patient Account Number: [id number] Date of Birth/Sex: Treating RN: 05/17/1945 (76 y.o. Tonita Phoenix, Lauren Primary Care Provider: Inda Coke  Other Clinician: Referring Provider: Treating Provider/Extender: Twana First, Calton Golds in Treatment: 1 Subjective History of Present Illness (HPI) ADMISSION 02/08/2021 This is a 76 year old woman who suffered a fall in early December while going down stairs.. She was seen in the ER. She was given Rocephin and Keflex. She was also followed up in the ER several days later. A DVT rule out was negative. On 1/9 she started following up with her primary doctor. Notable for the fact that she was having some bleeding. She was continued on Keflex on 1/12 using an Ace wrap. She was referred to our clinic for review. She is not on any anticoagulants although she is on aspirin. She has a history of vein ligation in 2005 when she was in [place]. She tells me she did wear compression socks the first week that she had this done and more recently has been using Ace wraps. Past medical history includes congestive heart failure, psoriasis, arthritis, right total knee replacement, vein stripping as noted. She also has macular degeneration. The wound is on  her right lateral lower extremity. Her ABIs were 1.1 in our clinic 2/1; patient was admitted the clinic last week she had a contusion on the right lateral leg. It had fully evacuated for the most part when she came here. No real problem with this is not just the physical depth but the undermining depth superiorly. Culture I did of this last week is still pending but obviously did not grow the standard worrisome organisms including staph strep and the common gram-negative's. I did not give her antibiotics. We have been using silver alginate. Objective Constitutional Patient is hypertensive.. Pulse regular and within target range for patient.Marland Kitchen Respirations regular, non-labored and within target range.. Temperature is normal and within the target range for the patient.Marland Kitchen Appears in no distress. Vitals Time Taken: 9:49 AM, Height: 68 in, Weight: 200 lbs, BMI: 30.4, Temperature: 98.7 F, Pulse: 74 bpm, Respiratory Rate: 17 breaths/min, Blood Pressure: 152/74 mmHg. General Notes: Wound exam; the wound in terms of length and width is a small oval-shaped wound on the right lateral leg. It has significant direct depth and more problematically significant undermining. There was retained silver alginate in the wound which I removed and then I used a #5 curette to remove necrotic surface debris this is deep and probably approaches the deep fascial layers of her calf. There is no obvious purulence no surrounding cellulitis Integumentary (Hair, Skin) Wound #1 status is Open. Original cause of wound was Trauma. The date acquired was: 12/15/2020. The wound has been in treatment 1 weeks. The wound is located on the Right,Lateral Lower Leg. The wound measures 1.7cm length x 1.5cm width x 1.7cm depth; 2.003cm^2 area and 3.405cm^3 volume. There is Fat Layer (Subcutaneous Tissue) exposed. There is no tunneling noted, however, there is undermining starting at 12:00 and ending at 12:00 with a maximum distance of 6.5cm.  There is a large amount of serosanguineous drainage noted. The wound margin is distinct with the outline attached to the wound base. There is small (1-33%) red, pink granulation within the wound bed. There is a large (67-100%) amount of necrotic tissue within the wound bed including Eschar. Assessment Active Problems ICD-10 Contusion of right lower leg, subsequent encounter Non-pressure chronic ulcer of other part of right lower leg with other specified severity Chronic venous hypertension (idiopathic) without complications of right lower extremity Procedures Wound #1 Pre-procedure diagnosis of Wound #1 is a Venous Leg Ulcer located on the Right,Lateral Lower Leg .Severity  of Tissue Pre Debridement is: Fat layer exposed. There was a Excisional Skin/Subcutaneous Tissue Debridement with a total area of 2.55 sq cm performed by Ricard Dillon., MD. With the following instrument(s): Curette to remove Viable and Non-Viable tissue/material. Material removed includes Subcutaneous Tissue, Slough, Skin: Dermis, and Skin: Epidermis after achieving pain control using Lidocaine. No specimens were taken. A time out was conducted at 10:15, prior to the start of the procedure. A Minimum amount of bleeding was controlled with Pressure. The procedure was tolerated well with a pain level of 0 throughout and a pain level of 0 following the procedure. Post Debridement Measurements: 1.7cm length x 1.5cm width x 1.7cm depth; 3.405cm^3 volume. Character of Wound/Ulcer Post Debridement is improved. Severity of Tissue Post Debridement is: Fat layer exposed. Post procedure Diagnosis Wound #1: Same as Pre-Procedure Pre-procedure diagnosis of Wound #1 is a Venous Leg Ulcer located on the Right,Lateral Lower Leg . There was a Three Layer Compression Therapy Procedure by Rhae Hammock, RN. Post procedure Diagnosis Wound #1: Same as Pre-Procedure Plan Follow-up Appointments: Return Appointment in 1 week. - Dr  Dellia Nims Bathing/ Shower/ Hygiene: Other Bathing/Shower/Hygiene Orders/Instructions: - Use leg protective cover. The protective cover can be purchased this from [facility] or [facility] (approximately $20). Other name for protective cover is a cast protector. Negative Presssure Wound Therapy: Other: - We are running your insurance for a wound vac. We are not ordering it yet though!! Edema Control - Lymphedema / SCD / Other: Avoid standing for long periods of time. - Elevate legs throughout the day Off-Loading: Wound #1 Right,Lateral Lower Leg: Other: - Rest Right leg as needed WOUND #1: - Lower Leg Wound Laterality: Right, Lateral Cleanser: Soap and Water Discharge Instructions: cover right leg. May shower. Prim Dressing: KerraCel Ag Gelling Fiber Dressing, 4x5 in (silver alginate) ary Discharge Instructions: Apply silver alginate to wound bed as instructed Secondary Dressing: Woven Gauze Sponge, Non-Sterile 4x4 in Discharge Instructions: Apply over primary dressing as directed. Secondary Dressing: Zetuvit Plus 4x8 in Discharge Instructions: Apply over primary dressing as directed. Com pression Wrap: ThreePress (3 layer compression wrap) Discharge Instructions: Apply three layer compression as directed. 1. We continued with the silver alginate under compression for now 2. 3 layer compression 3. I am going to go ahead with the preliminary abscess steps in obtaining a wound VAC 4. Although our culture is still listed as "pending", I am just not seeing enough evidence of significant infection to consider systemic antibiotics. Topical antibiotics may be another thing but we are using also using silver for this effect Electronic Signature(s) Signed: 02/15/2021 4:31:34 PM By: Linton Ham MD Entered By: Linton Ham on 02/15/2021 10:51:50 -------------------------------------------------------------------------------- SuperBill Details Patient Name: Date of Service: [name], [place]  02/15/2021 Medical Record Number: [id number] Patient Account Number: [id number] Date of Birth/Sex: Treating RN: [birth year] (76 y.o. Tonita Phoenix, Lauren Primary Care Provider: Inda Coke Other Clinician: Referring Provider: Treating Provider/Extender: Hortencia Pilar in Treatment: 1 Diagnosis Coding ICD-10 Codes Code Description S80.11XD Contusion of right lower leg, subsequent encounter L97.818 Non-pressure chronic ulcer of other part of right lower leg with other specified severity I87.301 Chronic venous hypertension (idiopathic) without complications of right lower extremity Facility Procedures CPT4 Code: [id number] Description: [id number] - DEB SUBQ TISSUE 20 SQ CM/< ICD-10 Diagnosis Description L97.818 Non-pressure chronic ulcer of other part of right lower leg with other specified Modifier: severity Quantity: 1 Physician Procedures : CPT4 Code Description Modifier [id number] - WC PHYS SUBQ TISS 20 SQ CM ICD-10  Diagnosis Description L97.818 Non-pressure chronic ulcer of other part of right lower leg with other specified severity Quantity: 1 Electronic Signature(s) Signed: 02/15/2021 4:31:34 PM By: Linton Ham MD Entered By: Linton Ham on 02/15/2021 10:52:09

## 2021-02-16 LAB — AEROBIC/ANAEROBIC CULTURE W GRAM STAIN (SURGICAL/DEEP WOUND)

## 2021-02-22 ENCOUNTER — Encounter (HOSPITAL_BASED_OUTPATIENT_CLINIC_OR_DEPARTMENT_OTHER): Payer: Medicare Other | Admitting: Internal Medicine

## 2021-02-22 ENCOUNTER — Other Ambulatory Visit: Payer: Self-pay

## 2021-02-22 DIAGNOSIS — S8011XA Contusion of right lower leg, initial encounter: Secondary | ICD-10-CM | POA: Diagnosis not present

## 2021-02-22 NOTE — Progress Notes (Signed)
Hitchman, Hula ([id number]) Visit Report for 02/22/2021 Arrival Information Details Patient Name: Date of Service: CAMBRIA, UMEDA 02/22/2021 9:15 A M Medical Record Number: [id number] Patient Account Number: [id number] Date of Birth/Sex: Treating RN: Aug 25, 1945 (76 y.o. Wynelle Link Primary Care Phoenyx Melka: [name] Other Clinician: Referring Garlene Apperson: Treating Rasheedah Reis/Extender: Montey Hora in Treatment: 2 Visit Information History Since Last Visit Added or deleted any medications: No Patient Arrived: Ambulatory Any new allergies or adverse reactions: No Arrival Time: 09:22 Had a fall or experienced change in No Accompanied By: self activities of daily living that may affect Transfer Assistance: None risk of falls: Patient Identification Verified: Yes Signs or symptoms of abuse/neglect since last visito No Secondary Verification Process Completed: Yes Hospitalized since last visit: No Patient Requires Transmission-Based Precautions: No Implantable device outside of the clinic excluding No Patient Has Alerts: No cellular tissue based products placed in the center since last visit: Has Dressing in Place as Prescribed: Yes Pain Present Now: No Electronic Signature(s) Signed: 02/22/2021 2:07:25 PM By: Karl Ito Entered By: Karl Ito on 02/22/2021 09:22:57 -------------------------------------------------------------------------------- Compression Therapy Details Patient Name: Date of Service: [name], Sanye 02/22/2021 9:15 A M Medical Record Number: [id number] Patient Account Number: [id number] Date of Birth/Sex: Treating RN: [birth year] (76 y.o. Tommye Standard Primary Care Aul Mangieri: [name] Other Clinician: Referring Almira Phette[place]: Treating Kaysan Peixoto/Extender: Montey Hora in Treatment: 2 Compression Therapy Performed for Wound Assessment: Wound #1 Right,Lateral Lower Leg Performed By: Clinician  Zenaida Deed, RN Compression Type: Three Layer Post Procedure Diagnosis Same as Pre-procedure Electronic Signature(s) Signed: 02/22/2021 4:36:42 PM By: Zenaida Deed RN, BSN Entered By: Zenaida Deed on 02/22/2021 09:47:02 -------------------------------------------------------------------------------- Encounter Discharge Information Details Patient Name: Date of Service: [name], Arnita 02/22/2021 9:15 A M Medical Record Number: [id number] Patient Account Number: [id number] Date of Birth/Sex: Treating RN: 1945-12-11 (76 y.o. Tommye Standard Primary Care Aayla Marrocco: [name] Other Clinician: Referring Ashante Yellin: Treating Kariel Skillman/Extender: Montey Hora in Treatment: 2 Encounter Discharge Information Items Discharge Condition: Stable Ambulatory Status: Ambulatory Discharge Destination: Home Transportation: Private Auto Accompanied By: self Schedule Follow-up Appointment: Yes Clinical Summary of Care: Patient Declined Electronic Signature(s) Signed: 02/22/2021 4:36:42 PM By: Zenaida Deed RN, BSN Entered By: Zenaida Deed on 02/22/2021 11:32:23 -------------------------------------------------------------------------------- Lower Extremity Assessment Details Patient Name: Date of Service: [name], Kady 02/22/2021 9:15 A M Medical Record Number: [id number] Patient Account Number: [id number] Date of Birth/Sex: Treating RN: 16-Apr-1945 (76 y.o. Tommye Standard Primary Care Dayne Dekay: [name] Other Clinician: Referring Sherald Balbuena: Treating Adria Costley/Extender: Danny Lawless Weeks in Treatment: 2 Edema Assessment Assessed: [Left: No] [Right: No] Edema: [Left: Ye] [Right: s] Calf Left: Right: Point of Measurement: 33 cm From Medial Instep 37 cm Ankle Left: Right: Point of Measurement: 7 cm From Medial Instep 24 cm Vascular Assessment Pulses: Dorsalis Pedis Palpable: [Right:Yes] Electronic  Signature(s) Signed: 02/22/2021 4:36:42 PM By: Zenaida Deed RN, BSN Entered By: Zenaida Deed on 02/22/2021 09:34:02 -------------------------------------------------------------------------------- Multi Wound Chart Details Patient Name: Date of Service: [name], Dalena 02/22/2021 9:15 A M Medical Record Number: [id number] Patient Account Number: [id number] Date of Birth/Sex: Treating RN: 02/20/1945 (76 y.o. Wynelle Link Primary Care Yousuf Ager: [name] Other Clinician: Referring Eloyse Causey: Treating Stirling Orton/Extender: Montey Hora in Treatment: 2 Vital Signs Height(in): 68 Pulse(bpm): 75 Weight(lbs): 200 Blood Pressure(mmHg): 157/94 Body Mass Index(BMI): 30.4 Temperature(F): 98.1 Respiratory Rate(breaths/min): 17 Photos: [N/A:N/A] Right, Lateral Lower Leg N/A N/A Wound Location: Trauma N/A N/A Wounding Event: Venous Leg  Ulcer N/A N/A Primary Etiology: Osteoarthritis N/A N/A Comorbid History: 12/15/2020 N/A N/A Date Acquired: 2 N/A N/A Weeks of Treatment: Open N/A N/A Wound Status: No N/A N/A Wound Recurrence: 1.8x1.3x1.1 N/A N/A Measurements L x W x D (cm) 1.838 N/A N/A A (cm) : rea 2.022 N/A N/A Volume (cm) : 6.40% N/A N/A % Reduction in A rea: 48.50% N/A N/A % Reduction in Volume: 12 Position 1 (o'clock): 4.8 Maximum Distance 1 (cm): 11 Starting Position 1 (o'clock): 7 Ending Position 1 (o'clock): 1.3 Maximum Distance 1 (cm): Yes N/A N/A Tunneling: Yes N/A N/A Undermining: Full Thickness With Exposed Support N/A N/A Classification: Structures Medium N/A N/A Exudate Amount: Serosanguineous N/A N/A Exudate Type: red, brown N/A N/A Exudate Color: Distinct, outline attached N/A N/A Wound Margin: Small (1-33%) N/A N/A Granulation Amount: Red N/A N/A Granulation Quality: Large (67-100%) N/A N/A Necrotic Amount: Fat Layer (Subcutaneous Tissue): Yes N/A N/A Exposed Structures: Fascia: No Tendon:  No Muscle: No Joint: No Bone: No None N/A N/A Epithelialization: Compression Therapy N/A N/A Procedures Performed: Negative Pressure Wound Therapy Application (NPWT) Treatment Notes Electronic Signature(s) Signed: 02/22/2021 4:37:10 PM By: Baltazar Najjar MD Signed: 02/22/2021 6:23:21 PM By: Zandra Abts RN, BSN Entered By: Baltazar Najjar on 02/22/2021 09:52:45 -------------------------------------------------------------------------------- Multi-Disciplinary Care Plan Details Patient Name: Date of Service: [name], Holliday 02/22/2021 9:15 A M Medical Record Number: [id number] Patient Account Number: [id number] Date of Birth/Sex: Treating RN: [birth year] (76 y.o. Tommye Standard Primary Care Danis Pembleton: [name] Other Clinician: Referring Everli Rother: Treating Jashiya Bassett/Extender: Montey Hora in Treatment: 2 Multidisciplinary Care Plan reviewed with physician Active Inactive Venous Leg Ulcer Nursing Diagnoses: Knowledge deficit related to disease process and management Potential for venous Insuffiency (use before diagnosis confirmed) Goals: Patient will maintain optimal edema control Date Initiated: 02/22/2021 Target Resolution Date: 03/22/2021 Goal Status: Active Interventions: Assess peripheral edema status every visit. Compression as ordered Provide education on venous insufficiency Treatment Activities: Therapeutic compression applied : 02/22/2021 Notes: Wound/Skin Impairment Nursing Diagnoses: Impaired tissue integrity Goals: Patient/caregiver will verbalize understanding of skin care regimen Date Initiated: 02/08/2021 Target Resolution Date: 03/10/2021 Goal Status: Active Interventions: Assess ulceration(s) every visit Notes: Electronic Signature(s) Signed: 02/22/2021 4:36:42 PM By: Zenaida Deed RN, BSN Entered By: Zenaida Deed on 02/22/2021  09:38:13 -------------------------------------------------------------------------------- Negative Pressure Wound Therapy Application (NPWT) Details Patient Name: Date of Service: CANDI, PROFIT 02/22/2021 9:15 A M Medical Record Number: [id number] Patient Account Number: [id number] Date of Birth/Sex: Treating RN: [birth year] (76 y.o. Tommye Standard Primary Care Amarii Bordas: [name] Other Clinician: Referring Amrom Ore: Treating Diarra Kos/Extender: Montey Hora in Treatment: 2 NPWT Application Performed for: Wound #1 Right, Lateral Lower Leg Performed By: Zenaida Deed, RN Type: Other Coverage Size (sq cm): 2.34 Pressure Type: Constant Pressure Setting: 125 mmHG Drain Type: None Quantity of Sponges/Gauze Inserted: 2 Sponge/Dressing Type: Foam, Blue Date Initiated: 02/22/2021 Response to Treatment: good Post Procedure Diagnosis Same as Pre-procedure Notes SNAP VAC Electronic Signature(s) Signed: 02/22/2021 4:36:42 PM By: Zenaida Deed RN, BSN Entered By: Zenaida Deed on 02/22/2021 09:48:02 -------------------------------------------------------------------------------- Pain Assessment Details Patient Name: Date of Service: [name], Daana 02/22/2021 9:15 A M Medical Record Number: [id number] Patient Account Number: [id number] Date of Birth/Sex: Treating RN: [birth year] (76 y.o. Wynelle Link Primary Care Lugene Beougher: [name] Other Clinician: Referring Arianne Klinge: Treating Elize Pinon/Extender: Montey Hora in Treatment: 2 Active Problems Location of Pain Severity and Description of Pain Patient Has Paino No Site Locations Pain Management and Medication Current Pain Management: Electronic  Signature(s) Signed: 02/22/2021 2:07:25 PM By: Karl Ito Signed: 02/22/2021 6:23:21 PM By: Zandra Abts RN, BSN Entered By: Karl Ito on 02/22/2021  09:23:20 -------------------------------------------------------------------------------- Patient/Caregiver Education Details Patient Name: Date of Service: [name], Morgen 2/8/2023andnbsp9:15 A M Medical Record Number: [id number] Patient Account Number: [id number] Date of Birth/Gender: Treating RN: 22-Jun-1945 (76 y.o. Tommye Standard Primary Care Physician: [name] Other Clinician: Referring Physician: Treating Physician/Extender: Montey Hora in Treatment: 2 Education Assessment Education Provided To: Patient Education Topics Provided Venous: Methods: Explain/Verbal Responses: Reinforcements needed, State content correctly Electronic Signature(s) Signed: 02/22/2021 4:36:42 PM By: Zenaida Deed RN, BSN Entered By: Zenaida Deed on 02/22/2021 09:38:29 -------------------------------------------------------------------------------- Wound Assessment Details Patient Name: Date of Service: [name], Mackenzi 02/22/2021 9:15 A M Medical Record Number: [id number] Patient Account Number: [id number] Date of Birth/Sex: Treating RN: [birth year] (76 y.o. Wynelle Link Primary Care Kayo Zion: [name] Other Clinician: Referring Bettyjane Shenoy: Treating Shana Younge/Extender: Danny Lawless Weeks in Treatment: 2 Wound Status Wound Number: 1 Primary Etiology: Venous Leg Ulcer Wound Location: Right, Lateral Lower Leg Wound Status: Open Wounding Event: Trauma Comorbid History: Osteoarthritis Date Acquired: 12/15/2020 Weeks Of Treatment: 2 Clustered Wound: No Photos Wound Measurements Length: (cm) 1.8 Width: (cm) 1.3 Depth: (cm) 1.1 Area: (cm) 1.838 Volume: (cm) 2.022 % Reduction in Area: 6.4% % Reduction in Volume: 48.5% Epithelialization: None Tunneling: Yes Position (o'clock): 12 Maximum Distance: (cm) 4.8 Undermining: Yes Starting Position (o'clock): 11 Ending Position (o'clock): 7 Maximum Distance: (cm) 1.3 Wound  Description Classification: Full Thickness With Exposed Support Struct Wound Margin: Distinct, outline attached Exudate Amount: Medium Exudate Type: Serosanguineous Exudate Color: red, brown ures Foul Odor After Cleansing: No Slough/Fibrino Yes Wound Bed Granulation Amount: Small (1-33%) Exposed Structure Granulation Quality: Red Fascia Exposed: No Necrotic Amount: Large (67-100%) Fat Layer (Subcutaneous Tissue) Exposed: Yes Necrotic Quality: Adherent Slough Tendon Exposed: No Muscle Exposed: No Joint Exposed: No Bone Exposed: No Treatment Notes Wound #1 (Lower Leg) Wound Laterality: Right, Lateral Cleanser Soap and Water Discharge Instruction: cover right leg. May shower. Peri-Wound Care Sween Lotion (Moisturizing lotion) Discharge Instruction: Apply moisturizing lotion as directed Topical Primary Dressing SNAP Therapy System Dressing, 6x6 (in/in) Secondary Dressing Secured With Compression Wrap ThreePress (3 layer compression wrap) Discharge Instruction: Apply three layer compression as directed. Compression Stockings Add-Ons Electronic Signature(s) Signed: 02/22/2021 4:36:42 PM By: Zenaida Deed RN, BSN Signed: 02/22/2021 6:23:21 PM By: Zandra Abts RN, BSN Entered By: Zenaida Deed on 02/22/2021 09:35:05 -------------------------------------------------------------------------------- Vitals Details Patient Name: Date of Service: [name], Rula 02/22/2021 9:15 A M Medical Record Number: [id number] Patient Account Number: [id number] Date of Birth/Sex: Treating RN: 1945-05-10 (76 y.o. Wynelle Link Primary Care Tsuyako Jolley: [name] Other Clinician: Referring Jameah Rouser: Treating Josi Roediger/Extender: Montey Hora in Treatment: 2 Vital Signs Time Taken: 09:22 Temperature (F): 98.1 Height (in): 68 Pulse (bpm): 75 Weight (lbs): 200 Respiratory Rate (breaths/min): 17 Body Mass Index (BMI): 30.4 Blood Pressure (mmHg):  157/94 Reference Range: 80 - 120 mg / dl Electronic Signature(s) Signed: 02/22/2021 2:07:25 PM By: Karl Ito Entered By: Karl Ito on 02/22/2021 09:23:13

## 2021-02-22 NOTE — Progress Notes (Signed)
[name], [name] ([id number]) Visit Report for 02/22/2021 HPI Details Patient Name: Date of Service: [name], [name] 02/22/2021 9:15 A M Medical Record Number: [id number] Patient Account Number: [id number] Date of Birth/Sex: Treating RN: 03/02/1945 (76 y.o. Wynelle LinkF) [name], [name] Primary Care Provider: Jarold MottoWorley, Samantha Other Clinician: Referring Provider: Treating Provider/Extender: Montey Horaobson, Glee Lashomb Worley, Samantha Weeks in Treatment: 2 History of Present Illness HPI Description: ADMISSION 02/08/2021 This is a 76 year old woman who suffered a fall in early December while going down stairs.. She was seen in the ER. She was given Rocephin and Keflex. She was also followed up in the ER several days later. A DVT rule out was negative. On 1/9 she started following up with her primary doctor. Notable for the fact that she was having some bleeding. She was continued on Keflex on 1/12 using an Ace wrap. She was referred to our clinic for review. She is not on any anticoagulants although she is on aspirin. She has a history of vein ligation in 2005 when [place] was in [place]. She tells me she did wear compression socks the first week that she had this done and more recently has been using Ace wraps. Past medical history includes congestive heart failure, psoriasis, arthritis, right total knee replacement, vein stripping as noted. She also has macular degeneration. The wound is on her right lateral lower extremity. Her ABIs were 1.1 in our clinic 2/1; patient was admitted the clinic last week she had a contusion on the right lateral leg. It had fully evacuated for the most part when she came here. No real problem with this is not just the physical depth but the undermining depth superiorly. Culture I did of this last week is still pending but obviously did not grow the standard worrisome organisms including staph strep and the common gram-negative's. I did not give her antibiotics. We have been using silver  alginate. 2/8; patient's wound tunneling superiorly on the right leg 4.5 cm which is an improvement. Culture I did last week was negative so showing only diphtheroids. She does not seem to need any additional systemic antibiotics. At the suggestion of our intake nurse I [facility] a snap VAC under compression Electronic Signature(s) Signed: 02/22/2021 4:37:10 PM By: Baltazar Najjarobson, Jessicah Croll MD Entered By: Baltazar Najjarobson, Falicity Sheets on 02/22/2021 09:54:22 -------------------------------------------------------------------------------- Physical Exam Details Patient Name: Date of Service: [place] [name], [name] 02/22/2021 9:15 A M Medical Record Number: [id number] Patient Account Number: [id number] Date of Birth/Sex: Treating RN: 04/16/1945 (76 y.o. Wynelle LinkF) [name], [name] Primary Care Provider: Jarold MottoWorley, Samantha Other Clinician: Referring Provider: Treating Provider/Extender: Montey Horaobson, Leomia Blake Worley, Samantha Weeks in Treatment: 2 Constitutional Patient is hypertensive.. Pulse regular and within target range for patient.Marland Kitchen. Respirations regular, non-labored and within target range.. Temperature is normal and within the target range for the patient.Marland Kitchen. Appears in no distress. Notes Wound exam; the undermining of the wound on the right lateral leg 4.5 cm directly superiorly. This is coming a bit surface of the wound is deep looks marginal. I did not do any debridement. There is no surrounding evidence of infection or crepitus Electronic Signature(s) Signed: 02/22/2021 4:37:10 PM By: Baltazar Najjarobson, Flannery Cavallero MD Entered By: Baltazar Najjarobson, Cleda Imel on 02/22/2021 09:55:11 -------------------------------------------------------------------------------- Physician Orders Details Patient Name: Date of Service: [name], [name] 02/22/2021 9:15 A M Medical Record Number: [id number] Patient Account Number: [id number] Date of Birth/Sex: Treating RN: 06/25/1945 (76 y.o. Tommye StandardF) [name], Cesia Primary Care Provider: Jarold MottoWorley, Samantha Other  Clinician: Referring Provider: Treating Provider/Extender: Montey Horaobson, Gertrude Tarbet Worley, Samantha Weeks in Treatment:  2 Verbal / Phone Orders: No Diagnosis Coding ICD-10 Coding Code Description S80.11XD Contusion of right lower leg, subsequent encounter L97.818 Non-pressure chronic ulcer of other part of right lower leg with other specified severity I87.301 Chronic venous hypertension (idiopathic) without complications of right lower extremity Follow-up Appointments ppointment in 1 week. - Dr Leanord Hawking room 7 Return A Bathing/ Shower/ Hygiene May shower with protection but do not get wound dressing(s) wet. Negative Presssure Wound Therapy Wound #1 Right,Lateral Lower Leg SNAP Vac to wound continuously at 156mm/hg pressure Edema Control - Lymphedema / SCD / Other Right Lower Extremity Elevate legs to the level of the heart or above for 30 minutes daily and/or when sitting, a frequency of: - throughout the day void standing for long periods of time. - Elevate legs throughout the day A Exercise regularly Wound Treatment Wound #1 - Lower Leg Wound Laterality: Right, Lateral Cleanser: Soap and Water 1 x Per Week Discharge Instructions: cover right leg. May shower. Peri-Wound Care: Sween Lotion (Moisturizing lotion) 1 x Per Week Discharge Instructions: Apply moisturizing lotion as directed Prim Dressing: SNAP Therapy System Dressing, 6x6 (in/in) ary 1 x Per Week Compression Wrap: ThreePress (3 layer compression wrap) 1 x Per Week Discharge Instructions: Apply three layer compression as directed. Electronic Signature(s) Signed: 02/22/2021 4:36:42 PM By: Zenaida Deed RN, BSN Signed: 02/22/2021 4:37:10 PM By: Baltazar Najjar MD Entered By: Zenaida Deed on 02/22/2021 09:51:12 -------------------------------------------------------------------------------- Problem List Details Patient Name: Date of Service: [name], [name] 02/22/2021 9:15 A M Medical Record Number: [id number] Patient Account  Number: [id number] Date of Birth/Sex: Treating RN: Sep 27, 1945 (76 y.o. Tommye Standard Primary Care Provider: [name] Other Clinician: Referring Provider: Treating Provider/Extender: Montey Hora in Treatment: 2 Active Problems ICD-10 Encounter Code Description Active Date MDM Diagnosis S80.11XD Contusion of right lower leg, subsequent encounter 02/08/2021 No Yes L97.818 Non-pressure chronic ulcer of other part of right lower leg with other specified 02/08/2021 No Yes severity I87.301 Chronic venous hypertension (idiopathic) without complications of right lower 02/08/2021 No Yes extremity Inactive Problems Resolved Problems Electronic Signature(s) Signed: 02/22/2021 4:37:10 PM By: Baltazar Najjar MD Entered By: Baltazar Najjar on 02/22/2021 09:52:34 -------------------------------------------------------------------------------- Progress Note Details Patient Name: Date of Service: [name], [name] 02/22/2021 9:15 A M Medical Record Number: [id number] Patient Account Number: [id number] Date of Birth/Sex: Treating RN: Nov 27, 1945 (76 y.o. Wynelle Link Primary Care Provider: [name] Other Clinician: Referring Provider: Treating Provider/Extender: Montey Hora in Treatment: 2 Subjective History of Present Illness (HPI) ADMISSION 02/08/2021 This is a 76 year old woman who suffered a fall in early December while going down stairs.. She was seen in the ER. She was given Rocephin and Keflex. She was also followed up in the ER several days later. A DVT rule out was negative. On 1/9 she started following up with her primary doctor. Notable for the fact that she was having some bleeding. She was continued on Keflex on 1/12 using an Ace wrap. She was referred to our clinic for review. She is not on any anticoagulants although she is on aspirin. She has a history of vein ligation in 2005 when she was in [place]. She tells  me she did wear compression socks the first week that she had this done and more recently has been using Ace wraps. Past medical history includes congestive heart failure, psoriasis, arthritis, right total knee replacement, vein stripping as noted. She also has macular degeneration. The wound is on her right lateral lower extremity. Her ABIs  were 1.1 in our clinic 2/1; patient was admitted the clinic last week she had a contusion on the right lateral leg. It had fully evacuated for the most part when she came here. No real problem with this is not just the physical depth but the undermining depth superiorly. Culture I did of this last week is still pending but obviously did not grow the standard worrisome organisms including staph strep and the common gram-negative's. I did not give her antibiotics. We have been using silver alginate. 2/8; patient's wound tunneling superiorly on the right leg 4.5 cm which is an improvement. Culture I did last week was negative so showing only diphtheroids. She does not seem to need any additional systemic antibiotics. At the suggestion of our intake nurse I think the best [facility] choice here would be a snap VAC under compression Objective Constitutional Patient is hypertensive.. Pulse regular and within target range for patient.Marland Kitchen Respirations regular, non-labored and within target range.. Temperature is normal and within the target range for the patient.Marland Kitchen Appears in no distress. Vitals Time Taken: 9:22 AM, Height: 68 in, Weight: 200 lbs, BMI: 30.4, Temperature: 98.1 F, Pulse: 75 bpm, Respiratory Rate: 17 breaths/min, Blood Pressure: 157/94 mmHg. General Notes: Wound exam; the undermining of the wound on the right lateral leg 4.5 cm directly superiorly. This is coming a bit surface of the wound is deep looks marginal. I did not do any debridement. There is no surrounding evidence of infection or crepitus Integumentary (Hair, Skin) Wound #1 status is Open. Original  cause of wound was Trauma. The date acquired was: 12/15/2020. The wound has been in treatment 2 weeks. The wound is located on the Right,Lateral Lower Leg. The wound measures 1.8cm length x 1.3cm width x 1.1cm depth; 1.838cm^2 area and 2.022cm^3 volume. There is Fat Layer (Subcutaneous Tissue) exposed. Tunneling has been noted at 12:00 with a maximum distance of 4.8cm. Undermining begins at 11:00 and ends at 7:00 with a maximum distance of 1.3cm. There is a medium amount of serosanguineous drainage noted. The wound margin is distinct with the outline attached to the wound base. There is small (1-33%) red granulation within the wound bed. There is a large (67-100%) amount of necrotic tissue within the wound bed including Adherent Slough. Assessment Active Problems ICD-10 Contusion of right lower leg, subsequent encounter Non-pressure chronic ulcer of other part of right lower leg with other specified severity Chronic venous hypertension (idiopathic) without complications of right lower extremity Procedures Wound #1 Pre-procedure diagnosis of Wound #1 is a Venous Leg Ulcer located on the Right,Lateral Lower Leg . There was a Three Layer Compression Therapy Procedure by Zenaida Deed, RN. Post procedure Diagnosis Wound #1: Same as Pre-Procedure Plan Follow-up Appointments: Return Appointment in 1 week. - Dr Leanord Hawking room 7 Bathing/ Shower/ Hygiene: May shower with protection but do not get wound dressing(s) wet. Negative Presssure Wound Therapy: Wound #1 Right,Lateral Lower Leg: SNAP Vac to wound continuously at 169mm/hg pressure Edema Control - Lymphedema / SCD / Other: Elevate legs to the level of the heart or above for 30 minutes daily and/or when sitting, a frequency of: - throughout the day Avoid standing for long periods of time. - Elevate legs throughout the day Exercise regularly WOUND #1: - Lower Leg Wound Laterality: Right, Lateral Cleanser: Soap and Water 1 x Per Week/ Discharge  Instructions: cover right leg. May shower. Peri-Wound Care: Sween Lotion (Moisturizing lotion) 1 x Per Week/ Discharge Instructions: Apply moisturizing lotion as directed Prim Dressing: SNAP Therapy System  Dressing, 6x6 (in/in) 1 x Per Week/ ary Com pression Wrap: ThreePress (3 layer compression wrap) 1 x Per Week/ Discharge Instructions: Apply three layer compression as directed. 1. We are going to apply a snap VAC under 3 layer compression. 2. No evidence of infection. Electronic Signature(s) Signed: 02/22/2021 4:37:10 PM By: Baltazar Najjar MD Entered By: Baltazar Najjar on 02/22/2021 09:55:56 -------------------------------------------------------------------------------- SuperBill Details Patient Name: Date of Service: [name], [name] 02/22/2021 Medical Record Number: [id number] Patient Account Number: [id number] Date of Birth/Sex: Treating RN: 12/28/1945 (77 y.o. Wynelle Link Primary Care Provider: [name] Other Clinician: Referring Provider: Treating Provider/Extender: Montey Hora in Treatment: 2 Diagnosis Coding ICD-10 Codes Code Description S80.11XD Contusion of right lower leg, subsequent encounter L97.818 Non-pressure chronic ulcer of other part of right lower leg with other specified severity I87.301 Chronic venous hypertension (idiopathic) without complications of right lower extremity Facility Procedures CPT4 Code: [id number] Description: 97607 NEG PRESS WND TX <=50 SQ CM Modifier: Quantity: 1 Physician Procedures : CPT4 Code Description Modifier [id number] - WC PHYS LEVEL 4 - EST PT ICD-10 Diagnosis Description S80.11XD Contusion of right lower leg, subsequent encounter L97.818 Non-pressure chronic ulcer of other part of right lower leg with other specified  severity I87.301 Chronic venous hypertension (idiopathic) without complications of right lower extremity Quantity: 1 Electronic Signature(s) Signed: 02/22/2021 4:36:42 PM  By: Zenaida Deed RN, BSN Signed: 02/22/2021 4:37:10 PM By: Baltazar Najjar MD Entered By: Zenaida Deed on 02/22/2021 11:31:42

## 2021-03-01 ENCOUNTER — Encounter (HOSPITAL_BASED_OUTPATIENT_CLINIC_OR_DEPARTMENT_OTHER): Payer: Medicare Other | Admitting: Internal Medicine

## 2021-03-01 ENCOUNTER — Other Ambulatory Visit: Payer: Self-pay

## 2021-03-01 DIAGNOSIS — S8011XA Contusion of right lower leg, initial encounter: Secondary | ICD-10-CM | POA: Diagnosis not present

## 2021-03-02 NOTE — Progress Notes (Signed)
[name] ([id number]) Visit Report for 03/01/2021 Debridement Details Patient Name: Date of Service: [name], [name] 03/01/2021 9:45 A M Medical Record Number: [id number] Patient Account Number: [id number] Date of Birth/Sex: Treating RN: Feb 26, 1945 (76 y.o. [name] Primary Care Provider: Jarold [name] Other Clinician: Referring Provider: Treating Provider/Extender: Montey Hora in Treatment: 3 Debridement Performed for Assessment: Wound #1 Right,Lateral Lower Leg Performed By: Physician Maxwell Caul., MD Debridement Type: Debridement Severity of Tissue Pre Debridement: Fat layer exposed Level of Consciousness (Pre-procedure): Awake and Alert Pre-procedure Verification/Time Out Yes - 10:36 Taken: Start Time: 10:36 Pain Control: Lidocaine T Area Debrided (L x W): otal 1.5 (cm) x 1.5 (cm) = 2.25 (cm) Tissue and other material debrided: Viable, Non-Viable, Slough, Subcutaneous, Skin: Dermis , Skin: Epidermis, Slough Level: Skin/Subcutaneous Tissue Debridement Description: Excisional Instrument: Curette Specimen: Tissue Culture Number of Specimens T aken: 1 Bleeding: Minimum Hemostasis Achieved: Pressure End Time: 10:36 Procedural Pain: 0 Post Procedural Pain: 0 Response to Treatment: Procedure was tolerated well Level of Consciousness (Post- Awake and Alert procedure): Post Debridement Measurements of Total Wound Length: (cm) 1.5 Width: (cm) 1.5 Depth: (cm) 1.1 Volume: (cm) 1.944 Character of Wound/Ulcer Post Debridement: Improved Severity of Tissue Post Debridement: Fat layer exposed Post Procedure Diagnosis Same as Pre-procedure Electronic Signature(s) Signed: 03/01/2021 4:36:32 PM By: Baltazar Najjar MD Signed: 03/02/2021 6:28:13 PM By: Zandra Abts RN, BSN Entered By: Baltazar Najjar on 03/01/2021 10:54:09 -------------------------------------------------------------------------------- HPI Details Patient Name: Date of  Service: [name], [name] 03/01/2021 9:45 A M Medical Record Number: [id number] Patient Account Number: [id number] Date of Birth/Sex: Treating RN: 1945/07/28 (76 y.o. [name] Primary Care Provider: Jarold [name] Other Clinician: Referring Provider: Treating Provider/Extender: Montey Hora in Treatment: 3 History of Present Illness HPI Description: ADMISSION 02/08/2021 This is a 76 year old woman who suffered a fall in early December while going down stairs.. She was seen in the ER. She was given Rocephin and Keflex. She was also followed up in the ER several days later. A DVT rule out was negative. On 1/9 she started following up with her primary doctor. Notable for the fact that she was having some bleeding. She was continued on Keflex on 1/12 using an Ace wrap. She was referred to our clinic for review. She is not on any anticoagulants although she is on aspirin. She has a history of vein ligation in 2005 when she was in [place]. She tells me she did wear compression socks the first week that she had this done and more recently has been using Ace wraps. Past medical history includes congestive heart failure, psoriasis, arthritis, right total knee replacement, vein stripping as noted. She also has macular degeneration. The wound is on her right lateral lower extremity. Her ABIs were 1.1 in our clinic 2/1; patient was admitted the clinic last week she had a contusion on the right lateral leg. It had fully evacuated for the most part when she came here. No real problem with this is not just the physical depth but the undermining depth superiorly. Culture I did of this last week is still pending but obviously did not grow the standard worrisome organisms including staph strep and the common gram-negative's. I did not give her antibiotics. We have been using silver alginate. 2/8; patient's wound tunneling superiorly on the right leg 4.5 cm which is an  improvement. Culture I did last week was negative so showing only diphtheroids. She does not seem to need any additional systemic antibiotics.  At the suggestion of our intake nurse I think the best [facility] choice here would be a snap VAC under compression 2/15; no improvement in the superior tunnel now 1 week with a snap VAC under compression. Our intake nurse points out an additional tunnel at 9:00 which I did not identify last time. The surface of the wound looks better. There is some drainage and she is complaining of pain Electronic Signature(s) Signed: 03/01/2021 4:36:32 PM By: Baltazar Najjar MD Entered By: Baltazar Najjar on 03/01/2021 10:56:00 -------------------------------------------------------------------------------- Physical Exam Details Patient Name: Date of Service: [name], [name] 03/01/2021 9:45 A M Medical Record Number: [id number] Patient Account Number: [id number] Date of Birth/Sex: Treating RN: [birth year] (76 y.o. [name] Primary Care Provider: Jarold [name] Other Clinician: Referring Provider: Treating Provider/Extender: Montey Hora in Treatment: 3 Constitutional Sitting or standing Blood Pressure is within target range for patient.. Pulse regular and within target range for patient.Marland Kitchen Respirations regular, non-labored and within target range.. Temperature is normal and within the target range for the patient.Marland Kitchen Appears in no distress. Notes Wound exam; the undermining directly superiorly at 12:00 is 4.5 cm this is unchanged additional tunnel at 9:00 also with some depth. Because of the tenderness in the area I cultured the additional tunnel area but no empiric antibiotics there was no surrounding erythema. I used a #3 curette to take off surface debris which resulted in granulation that looks a lot better Electronic Signature(s) Signed: 03/01/2021 4:36:32 PM By: Baltazar Najjar MD Entered By: Baltazar Najjar on 03/01/2021  10:58:30 -------------------------------------------------------------------------------- Physician Orders Details Patient Name: Date of Service: [name], [name] 03/01/2021 9:45 A M Medical Record Number: [id number] Patient Account Number: [id number] Date of Birth/Sex: Treating RN: [birth year] (76 y.o. [name] Primary Care Provider: Jarold [name] Other Clinician: Referring Provider: Treating Provider/Extender: Montey Hora in Treatment: 3 Verbal / Phone Orders: No Diagnosis Coding Follow-up Appointments ppointment in 1 week. - Dr Leanord Hawking Return A Bathing/ Shower/ Hygiene May shower with protection but do not get wound dressing(s) wet. Negative Presssure Wound Therapy Wound #1 Right,Lateral Lower Leg SNAP Vac to wound continuously at 144mm/hg pressure Edema Control - Lymphedema / SCD / Other Right Lower Extremity Elevate legs to the level of the heart or above for 30 minutes daily and/or when sitting, a frequency of: - throughout the day void standing for long periods of time. - Elevate legs throughout the day A Exercise regularly Wound Treatment Wound #1 - Lower Leg Wound Laterality: Right, Lateral Cleanser: Soap and Water 1 x Per Week Discharge Instructions: cover right leg. May shower. Peri-Wound Care: Sween Lotion (Moisturizing lotion) 1 x Per Week Discharge Instructions: Apply moisturizing lotion as directed Prim Dressing: SNAP Therapy System Dressing, 6x6 (in/in) ary 1 x Per Week Compression Wrap: ThreePress (3 layer compression wrap) 1 x Per Week Discharge Instructions: Apply three layer compression as directed. Electronic Signature(s) Signed: 03/01/2021 4:36:32 PM By: Baltazar Najjar MD Signed: 03/02/2021 5:08:54 PM By: Fonnie Mu RN Entered By: Fonnie Mu on 03/01/2021 10:26:41 -------------------------------------------------------------------------------- Problem List Details Patient Name: Date of Service: GA  RNER, [name] 03/01/2021 9:45 A M Medical Record Number: [id number] Patient Account Number: [id number] Date of Birth/Sex: Treating RN: 04-Feb-1945 (76 y.o. [name] Primary Care Provider: Jarold [name] Other Clinician: Referring Provider: Treating Provider/Extender: Montey Hora in Treatment: 3 Active Problems ICD-10 Encounter Code Description Active Date MDM Diagnosis S80.11XD Contusion of right lower leg, subsequent encounter 02/08/2021 No Yes L97.818 Non-pressure chronic ulcer  of other part of right lower leg with other specified 02/08/2021 No Yes severity I87.301 Chronic venous hypertension (idiopathic) without complications of right lower 02/08/2021 No Yes extremity Inactive Problems Resolved Problems Electronic Signature(s) Signed: 03/01/2021 4:36:32 PM By: Baltazar Najjarobson, Windsor Goeken MD Entered By: Baltazar Najjarobson, Maelani Yarbro on 03/01/2021 10:51:36 -------------------------------------------------------------------------------- Progress Note Details Patient Name: Date of Service: [name] GellGA RNER, [name] 03/01/2021 9:45 A M Medical Record Number: [id number] Patient Account Number: [id number] Date of Birth/Sex: Treating RN: 05/28/1945 (76 y.o. [name] LinkF) Lynch, [name] Primary Care Provider: Jarold MottoWorley, Samantha Other Clinician: Referring Provider: Treating Provider/Extender: Montey Horaobson, Seng Fouts Worley, Samantha Weeks in Treatment: 3 Subjective History of Present Illness (HPI) ADMISSION 02/08/2021 This is a 76 year old woman who suffered a fall in early December while going down stairs.. She was seen in the ER. She was given Rocephin and Keflex. She was also followed up in the ER several days later. A DVT rule out was negative. On 1/9 she started following up with her primary doctor. Notable for the fact that she was having some bleeding. She was continued on Keflex on 1/12 using an Ace wrap. She was referred to our clinic for review. She is not on any anticoagulants although she is on  aspirin. She has a [place] of vein [place] 2005 [place] She tells me she did wear compression socks the first week that she had this done and more recently has been using Ace wraps. Past medical history includes congestive heart failure, psoriasis, arthritis, right total knee replacement, vein stripping as noted. She also has macular degeneration. The wound is on her right lateral lower extremity. Her ABIs were 1.1 in our clinic 2/1; patient was admitted the clinic last week she had a contusion on the right lateral leg. It had fully evacuated for the most part when she came here. No real problem with this is not just the physical depth but the undermining depth superiorly. Culture I did of this last week is still pending but obviously did not grow the standard worrisome organisms including staph strep and the common gram-negative's. I did not give her antibiotics. We have been using silver alginate. 2/8; patient's wound tunneling superiorly on the right leg 4.5 cm which is an improvement. Culture I did last week was negative so showing only diphtheroids. She does not seem to need any additional systemic antibiotics. At the suggestion of our intake nurse I think the best [facility] choice here would be a snap VAC under compression 2/15; no improvement in the superior tunnel now 1 week with a snap VAC under compression. Our intake nurse points out an additional tunnel at 9:00 which I did not identify last time. The surface of the wound looks better. There is some drainage and she is complaining of pain Objective Constitutional Sitting or standing Blood Pressure is within target range for patient.. Pulse regular and within target range for patient.Marland Kitchen. Respirations regular, non-labored and within target range.. Temperature is normal and within the target range for the patient.Marland Kitchen. Appears in no distress. Vitals Time Taken: 10:07 AM, Height: 68 in, Weight: 200 lbs, BMI: 30.4, Temperature:  98 F, Pulse: 74 bpm, Respiratory Rate: 17 breaths/min, Blood Pressure: 135/75 mmHg. General Notes: Wound exam; the undermining directly superiorly at 12:00 is 4.5 cm this is unchanged additional tunnel at 9:00 also with some depth. Because of the tenderness in the area I cultured the additional tunnel area but no empiric antibiotics there was no surrounding erythema. I used a #3 curette to take off surface debris  which resulted in granulation that looks a lot better Integumentary (Hair, Skin) Wound #1 status is Open. Original cause of wound was Trauma. The date acquired was: 12/15/2020. The wound has been in treatment 3 weeks. The wound is located on the Right,Lateral Lower Leg. The wound measures 1.5cm length x 1.5cm width x 1.1cm depth; 1.767cm^2 area and 1.944cm^3 volume. There is Fat Layer (Subcutaneous Tissue) exposed. Tunneling has been noted at 12:00 with a maximum distance of 5cm. There is additional tunneling and at 9:00 with a maximum distance of 3cm. Undermining begins at 12:00 and ends at 12:00 with a maximum distance of 1.1cm. There is a large amount of purulent drainage noted. The wound margin is distinct with the outline attached to the wound base. There is medium (34-66%) red granulation within the wound bed. There is a medium (34-66%) amount of necrotic tissue within the wound bed including Adherent Slough. Assessment Active Problems ICD-10 Contusion of right lower leg, subsequent encounter Non-pressure chronic ulcer of other part of right lower leg with other specified severity Chronic venous hypertension (idiopathic) without complications of right lower extremity Procedures Wound #1 Pre-procedure diagnosis of Wound #1 is a Venous Leg Ulcer located on the Right,Lateral Lower Leg .Severity of Tissue Pre Debridement is: Fat layer exposed. There was a Excisional Skin/Subcutaneous Tissue Debridement with a total area of 2.25 sq cm performed by Maxwell Caul., MD. With the  following instrument(s): Curette to remove Viable and Non-Viable tissue/material. Material removed includes Subcutaneous Tissue, Slough, Skin: Dermis, and Skin: Epidermis after achieving pain control using Lidocaine. 1 specimen was taken by a Tissue Culture and sent to the lab per facility protocol. A time out was conducted at 10:36, prior to the start of the procedure. A Minimum amount of bleeding was controlled with Pressure. The procedure was tolerated well with a pain level of 0 throughout and a pain level of 0 following the procedure. Post Debridement Measurements: 1.5cm length x 1.5cm width x 1.1cm depth; 1.944cm^3 volume. Character of Wound/Ulcer Post Debridement is improved. Severity of Tissue Post Debridement is: Fat layer exposed. Post procedure Diagnosis Wound #1: Same as Pre-Procedure Pre-procedure diagnosis of Wound #1 is a Venous Leg Ulcer located on the Right,Lateral Lower Leg . There was a Three Layer Compression Therapy Procedure by Fonnie Mu, RN. Post procedure Diagnosis Wound #1: Same as Pre-Procedure Plan Follow-up Appointments: Return Appointment in 1 week. - Dr Chauncey Mann Shower/ Hygiene: May shower with protection but do not get wound dressing(s) wet. Negative Presssure Wound Therapy: Wound #1 Right,Lateral Lower Leg: SNAP Vac to wound continuously at 153mm/hg pressure Edema Control - Lymphedema / SCD / Other: Elevate legs to the level of the heart or above for 30 minutes daily and/or when sitting, a frequency of: - throughout the day Avoid standing for long periods of time. - Elevate legs throughout the day Exercise regularly WOUND #1: - Lower Leg Wound Laterality: Right, Lateral Cleanser: Soap and Water 1 x Per Week/ Discharge Instructions: cover right leg. May shower. Peri-Wound Care: Sween Lotion (Moisturizing lotion) 1 x Per Week/ Discharge Instructions: Apply moisturizing lotion as directed Prim Dressing: SNAP Therapy System Dressing, 6x6 (in/in)  1 x Per Week/ ary Com pression Wrap: ThreePress (3 layer compression wrap) 1 x Per Week/ Discharge Instructions: Apply three layer compression as directed. 1. I continued with the snap VAC under compression 2. CNS culture of the tunnel at 9:00 but no empiric antibiotics Electronic Signature(s) Signed: 03/01/2021 4:36:32 PM By: Baltazar Najjar MD Entered By: Leanord Hawking,  Caliyah Sieh on 03/01/2021 10:59:08 -------------------------------------------------------------------------------- SuperBill Details Patient Name: Date of Service: [name] CobbleGA RNER, [name] 03/01/2021 Medical Record Number: [id number] Patient Account Number: [id number] Date of Birth/Sex: Treating RN: 09/07/1945 (76 y.o. Ardis RowanF) Breedlove, Lauren Primary Care Provider: Jarold MottoWorley, Samantha Other Clinician: Referring Provider: Treating Provider/Extender: Montey Horaobson, Violetta Lavalle Worley, Samantha Weeks in Treatment: 3 Diagnosis Coding ICD-10 Codes Code Description S80.11XD Contusion of right lower leg, subsequent encounter L97.818 Non-pressure chronic ulcer of other part of right lower leg with other specified severity I87.301 Chronic venous hypertension (idiopathic) without complications of right lower extremity Facility Procedures CPT4 Code: [id number] 1 Description: 1042 - DEB SUBQ TISSUE 20 SQ CM/< ICD-10 Diagnosis Description L97.818 Non-pressure chronic ulcer of other part of right lower leg with other specified Modifier: severity Quantity: 1 Physician Procedures : CPT4 Code Description Modifier [id number] 11042 - WC PHYS SUBQ TISS 20 SQ CM ICD-10 Diagnosis Description L97.818 Non-pressure chronic ulcer of other part of right lower leg with other specified severity Quantity: 1 Electronic Signature(s) Signed: 03/01/2021 4:36:32 PM By: Baltazar Najjarobson, Darsh Vandevoort MD Signed: 03/02/2021 5:08:54 PM By: Fonnie MuBreedlove, Lauren RN Entered By: Fonnie MuBreedlove, Lauren on 03/01/2021 11:47:40

## 2021-03-02 NOTE — Progress Notes (Signed)
[name], [name] ([id number]) Visit Report for 03/01/2021 Arrival Information Details Patient Name: Date of Service: [name], [name] 03/01/2021 9:45 A M Medical Record Number: [id number] Patient Account Number: [id number] Date of Birth/Sex: Treating RN: [birth year] (76 y.o. [name], [name] Primary Care Dorisann Schwanke: Inda Coke Other Clinician: Referring Wilborn Membreno: Treating Brettany Sydney/Extender: Hortencia Pilar in Treatment: 3 Visit Information History Since Last Visit Added or deleted any medications: No Patient Arrived: Ambulatory Any new allergies or adverse reactions: No Arrival Time: 09:56 Had a fall or experienced change in No Accompanied By: self activities of daily living that may affect Transfer Assistance: None risk of falls: Patient Identification Verified: Yes Signs or symptoms of abuse/neglect since last visito No Secondary Verification Process Completed: Yes Hospitalized since last visit: No Patient Requires Transmission-Based Precautions: No Implantable device outside of the clinic excluding No Patient Has Alerts: No cellular tissue based products placed in the center since last visit: Has Dressing in Place as Prescribed: Yes Has Compression in Place as Prescribed: Yes Pain Present Now: Yes Electronic Signature(s) Signed: 03/02/2021 5:08:54 PM By: Rhae Hammock RN Entered By: Rhae Hammock on 03/01/2021 09:58:40 -------------------------------------------------------------------------------- Compression Therapy Details Patient Name: Date of Service: [name], Javeria 03/01/2021 9:45 A M Medical Record Number: [id number] Patient Account Number: [id number] Date of Birth/Sex: Treating RN: 09/24/1945 (76 y.o. [name], [name] Primary Care Sarath Privott: Inda Coke Other Clinician: Referring Sadarius Norman: Treating Long Brimage/Extender: Hortencia Pilar in Treatment: 3 Compression Therapy Performed for Wound Assessment:  Wound #1 Right,Lateral Lower Leg Performed By: Clinician Rhae Hammock, RN Compression Type: Three Layer Post Procedure Diagnosis Same as Pre-procedure Electronic Signature(s) Signed: 03/02/2021 5:08:54 PM By: Rhae Hammock RN Entered By: Rhae Hammock on 03/01/2021 10:38:16 -------------------------------------------------------------------------------- Encounter Discharge Information Details Patient Name: Date of Service: [name], [name] 03/01/2021 9:45 A M Medical Record Number: [id number] Patient Account Number: [id number] Date of Birth/Sex: Treating RN: 1945/01/28 (76 y.o. [name], [name] Primary Care Marenda Accardi: Inda Coke Other Clinician: Referring Dexton Zwilling: Treating Hamlin Devine/Extender: Hortencia Pilar in Treatment: 3 Encounter Discharge Information Items Discharge Condition: Stable Ambulatory Status: Ambulatory Discharge Destination: Home Transportation: Private Auto Accompanied By: self Schedule Follow-up Appointment: Yes Clinical Summary of Care: Patient Declined Electronic Signature(s) Signed: 03/02/2021 5:08:54 PM By: Rhae Hammock RN Entered By: Rhae Hammock on 03/01/2021 10:28:24 -------------------------------------------------------------------------------- Lower Extremity Assessment Details Patient Name: Date of Service: [name], [name] 03/01/2021 9:45 A M Medical Record Number: [id number] Patient Account Number: [id number] Date of Birth/Sex: Treating RN: [birth year] (76 y.o. [name], [name] Primary Care Tanyon Alipio: Inda Coke Other Clinician: Referring Ainslie Mazurek: Treating Satrina Magallanes/Extender: Alease Frame Weeks in Treatment: 3 Edema Assessment Assessed: Shirlyn Goltz: No] [Right: Yes] Edema: [Left: Ye] [Right: s] Calf Left: Right: Point of Measurement: 33 cm From Medial Instep 37 cm Ankle Left: Right: Point of Measurement: 7 cm From Medial Instep 24 cm Vascular  Assessment Pulses: Dorsalis Pedis Palpable: [Right:Yes] Posterior Tibial Palpable: [Right:Yes] Electronic Signature(s) Signed: 03/02/2021 5:08:54 PM By: Rhae Hammock RN Entered By: Rhae Hammock on 03/01/2021 10:00:42 -------------------------------------------------------------------------------- Multi Wound Chart Details Patient Name: Date of Service: [name], [name] 03/01/2021 9:45 A M Medical Record Number: [id number] Patient Account Number: [id number] Date of Birth/Sex: Treating RN: 1945-07-12 (76 y.o. Nancy Fetter Primary Care Lowry Bala: Inda Coke Other Clinician: Referring Lane Eland: Treating Darlena Koval/Extender: Hortencia Pilar in Treatment: 3 Vital Signs Height(in): 68 Pulse(bpm): 74 Weight(lbs): 200 Blood Pressure(mmHg): 135/75 Body Mass Index(BMI): 30.4 Temperature(F): 98 Respiratory Rate(breaths/min): 17 Photos: [N/A:N/A] Right, Lateral Lower Leg N/A N/A  Wound Location: Trauma N/A N/A Wounding Event: Venous Leg Ulcer N/A N/A Primary Etiology: Osteoarthritis N/A N/A Comorbid History: 12/15/2020 N/A N/A Date Acquired: 3 N/A N/A Weeks of Treatment: Open N/A N/A Wound Status: No N/A N/A Wound Recurrence: 1.5x1.5x1.1 N/A N/A Measurements L x W x D (cm) 1.767 N/A N/A A (cm) : rea 1.944 N/A N/A Volume (cm) : 10.00% N/A N/A % Reduction in A rea: 50.50% N/A N/A % Reduction in Volume: 12 Position 1 (o'clock): 5 Maximum Distance 1 (cm): 9 Position 2 (o'clock): 3 Maximum Distance 2 (cm): 12 Starting Position 1 (o'clock): 12 Ending Position 1 (o'clock): 1.1 Maximum Distance 1 (cm): Yes N/A N/A Tunneling: Yes N/A N/A Undermining: Full Thickness With Exposed Support N/A N/A Classification: Structures Large N/A N/A Exudate A mount: Purulent N/A N/A Exudate Type: yellow, brown, green N/A N/A Exudate Color: Distinct, outline attached N/A N/A Wound Margin: Medium (34-66%) N/A N/A Granulation A  mount: Red N/A N/A Granulation Quality: Medium (34-66%) N/A N/A Necrotic A mount: Fat Layer (Subcutaneous Tissue): Yes N/A N/A Exposed Structures: Fascia: No Tendon: No Muscle: No Joint: No Bone: No None N/A N/A Epithelialization: Debridement - Excisional N/A N/A Debridement: Pre-procedure Verification/Time Out 10:36 N/A N/A Taken: Lidocaine N/A N/A Pain Control: Subcutaneous, Slough N/A N/A Tissue Debrided: Skin/Subcutaneous Tissue N/A N/A Level: 2.25 N/A N/A Debridement A (sq cm): rea Curette N/A N/A Instrument: Minimum N/A N/A Bleeding: Pressure N/A N/A Hemostasis Achieved: 0 N/A N/A Procedural Pain: 0 N/A N/A Post Procedural Pain: Procedure was tolerated well N/A N/A Debridement Treatment Response: 1.5x1.5x1.1 N/A N/A Post Debridement Measurements L x W x D (cm) 1.944 N/A N/A Post Debridement Volume: (cm) Compression Therapy N/A N/A Procedures Performed: Debridement Treatment Notes Wound #1 (Lower Leg) Wound Laterality: Right, Lateral Cleanser Soap and Water Discharge Instruction: cover right leg. May shower. Peri-Wound Care Sween Lotion (Moisturizing lotion) Discharge Instruction: Apply moisturizing lotion as directed Topical Primary Dressing SNAP Therapy System Dressing, 6x6 (in/in) Secondary Dressing Secured With Compression Wrap ThreePress (3 layer compression wrap) Discharge Instruction: Apply three layer compression as directed. Compression Stockings Add-Ons Electronic Signature(s) Signed: 03/01/2021 4:36:32 PM By: Baltazar Najjar MD Signed: 03/02/2021 6:28:13 PM By: Zandra Abts RN, BSN Entered By: Baltazar Najjar on 03/01/2021 10:52:08 -------------------------------------------------------------------------------- Multi-Disciplinary Care Plan Details Patient Name: Date of Service: [name], [name] 03/01/2021 9:45 A M Medical Record Number: [id number] Patient Account Number: [id number] Date of Birth/Sex: Treating RN: September 13, 1945 (76  y.o. Ardis Rowan, [name] Primary Care Shakura Cowing: [name] Other Clinician: Referring Vernell Townley: Treating Barnard Sharps/Extender: Montey Hora in Treatment: 3 Multidisciplinary Care Plan reviewed with physician Active Inactive Venous Leg Ulcer Nursing Diagnoses: Knowledge deficit related to disease process and management Potential for venous Insuffiency (use before diagnosis confirmed) Goals: Patient will maintain optimal edema control Date Initiated: 02/22/2021 Target Resolution Date: 03/22/2021 Goal Status: Active Interventions: Assess peripheral edema status every visit. Compression as ordered Provide education on venous insufficiency Treatment Activities: Therapeutic compression applied : 02/22/2021 Notes: Wound/Skin Impairment Nursing Diagnoses: Impaired tissue integrity Goals: Patient/caregiver will verbalize understanding of skin care regimen Date Initiated: 02/08/2021 Target Resolution Date: 03/10/2021 Goal Status: Active Interventions: Assess ulceration(s) every visit Notes: Electronic Signature(s) Signed: 03/02/2021 5:08:54 PM By: Fonnie Mu RN Entered By: Fonnie Mu on 03/01/2021 10:26:52 -------------------------------------------------------------------------------- Negative Pressure Wound Therapy Maintenance (NPWT) Details Patient Name: Date of Service: [name], [name] 03/01/2021 9:45 A M Medical Record Number: [id number] Patient Account Number: [id number] Date of Birth/Sex: Treating RN: 06-03-1945 (76 y.o. Ardis Rowan, [name] Primary Care Dwane Andres: Bufford Buttner,  Aldona Bar Other Clinician: Referring Kaidon Kinker: Treating Dennard Vezina/Extender: Hortencia Pilar in Treatment: 3 NPWT Maintenance Performed for: Wound #1 Right, Lateral Lower Leg Performed By: Rhae Hammock, RN Type: Other Coverage Size (sq cm): 2.25 Pressure Type: Constant Pressure Setting: 125 mmHG Drain Type: None Primary Contact:  Non-Adherent Sponge/Dressing Type: Foam, Blue Date Initiated: 02/22/2021 Dressing Removed: Yes Quantity of Sponges/Gauze Removed: 1 BLUE FOAM Canister Changed: Yes Canister Exudate Volume: 125 Dressing Reapplied: Yes Quantity of Sponges/Gauze Inserted: 1 BLUE FOAM Respones T Treatment: o tolerated well Days On NPWT : 8 Post Procedure Diagnosis Same as Pre-procedure Electronic Signature(s) Signed: 03/02/2021 5:08:54 PM By: Rhae Hammock RN Entered By: Rhae Hammock on 03/01/2021 16:41:18 -------------------------------------------------------------------------------- Pain Assessment Details Patient Name: Date of Service: [name], [name] 03/01/2021 9:45 A M Medical Record Number: [id number] Patient Account Number: [id number] Date of Birth/Sex: Treating RN: [birth year] (76 y.o. [name], [name] Primary Care Patrena Santalucia: Inda Coke Other Clinician: Referring Quintara Bost: Treating Blayklee Mable/Extender: Hortencia Pilar in Treatment: 3 Active Problems Location of Pain Severity and Description of Pain Patient Has Paino Yes Site Locations Pain Location: Pain in Ulcers With Dressing Change: Yes Duration of the Pain. Constant / Intermittento Intermittent Rate the pain. Current Pain Level: 3 Worst Pain Level: 10 Least Pain Level: 0 Tolerable Pain Level: 3 Character of Pain Describe the Pain: Aching Pain Management and Medication Current Pain Management: Medication: No Cold Application: No Rest: No Massage: No Activity: No T.E.N.S.: No Heat Application: No Leg drop or elevation: No Is the Current Pain Management Adequate: Adequate How does your wound impact your activities of daily livingo Sleep: No Bathing: No Appetite: No Relationship With Others: No Bladder Continence: No Emotions: No Bowel Continence: No Work: No Toileting: No Drive: No Dressing: No Hobbies: No Electronic Signature(s) Signed: 03/02/2021 5:08:54 PM By: Rhae Hammock RN Entered By: Rhae Hammock on 03/01/2021 09:59:36 -------------------------------------------------------------------------------- Patient/Caregiver Education Details Patient Name: Date of Service: [name], [name] 2/15/2023andnbsp9:45 [place] Record Number: [id number] Patient Account Number: [id number] Date of Birth/Gender: Treating RN: [birth year] (76 y.o. [name], [name] Primary Care Physician: Inda Coke Other Clinician: Referring Physician: Treating Physician/Extender: Hortencia Pilar in Treatment: 3 Education Assessment Education Provided To: Patient Education Topics Provided Venous: Methods: Explain/Verbal Responses: Reinforcements needed, State content correctly Electronic Signature(s) Signed: 03/02/2021 5:08:54 PM By: Rhae Hammock RN Entered By: Rhae Hammock on 03/01/2021 10:27:11 -------------------------------------------------------------------------------- Wound Assessment Details Patient Name: Date of Service: [name], [place] 03/01/2021 9:45 A M Medical Record Number: [id number] Patient Account Number: [id number] Date of Birth/Sex: Treating RN: 28-Nov-1945 (76 y.o. [name], Benjamin Primary Care Shashwat Cleary: Inda Coke Other Clinician: Referring Mckenzy Salazar: Treating Greenley Martone/Extender: Hortencia Pilar in Treatment: 3 Wound Status Wound Number: 1 Primary Etiology: Venous Leg Ulcer Wound Location: Right, Lateral Lower Leg Wound Status: Open Wounding Event: Trauma Comorbid History: Osteoarthritis Date Acquired: 12/15/2020 Weeks Of Treatment: 3 Clustered Wound: No Photos Wound Measurements Length: (cm) 1.5 Width: (cm) 1.5 Depth: (cm) 1.1 Area: (cm) 1.767 Volume: (cm) 1.944 % Reduction in Area: 10% % Reduction in Volume: 50.5% Epithelialization: None Tunneling: Yes Location 1 Position (o'clock): 12 Maximum Distance: (cm) 5 Location 2 Position (o'clock): 9 Maximum  Distance: (cm) 3 Undermining: Yes Starting Position (o'clock): 12 Ending Position (o'clock): 12 Maximum Distance: (cm) 1.1 Wound Description Classification: Full Thickness With Exposed Support Structures Wound Margin: Distinct, outline attached Exudate Amount: Large Exudate Type: Purulent Exudate Color: yellow, brown, green Foul Odor After Cleansing: No Slough/Fibrino Yes Wound Bed Granulation  Amount: Medium (34-66%) Exposed Structure Granulation Quality: Red Fascia Exposed: No Necrotic Amount: Medium (34-66%) Fat Layer (Subcutaneous Tissue) Exposed: Yes Necrotic Quality: Adherent Slough Tendon Exposed: No Muscle Exposed: No Joint Exposed: No Bone Exposed: No Treatment Notes Wound #1 (Lower Leg) Wound Laterality: Right, Lateral Cleanser Soap and Water Discharge Instruction: cover right leg. May shower. Peri-Wound Care Sween Lotion (Moisturizing lotion) Discharge Instruction: Apply moisturizing lotion as directed Topical Primary Dressing SNAP Therapy System Dressing, 6x6 (in/in) Secondary Dressing Secured With Compression Wrap ThreePress (3 layer compression wrap) Discharge Instruction: Apply three layer compression as directed. Compression Stockings Add-Ons Electronic Signature(s) Signed: 03/01/2021 5:14:15 PM By: Baruch Gouty RN, BSN Signed: 03/02/2021 5:08:54 PM By: Rhae Hammock RN Entered By: Baruch Gouty on 03/01/2021 10:23:03 -------------------------------------------------------------------------------- Vitals Details Patient Name: Date of Service: [name], [name] 03/01/2021 9:45 A M Medical Record Number: [id number] Patient Account Number: [id number] Date of Birth/Sex: Treating RN: 1945/03/30 (76 y.o. [name], [name] Primary Care Kerem Gilmer: Inda Coke Other Clinician: Referring Zamarion Longest: Treating Brynja Marker/Extender: Hortencia Pilar in Treatment: 3 Vital Signs Time Taken: 10:07 Temperature (F): 98 Height  (in): 68 Pulse (bpm): 74 Weight (lbs): 200 Respiratory Rate (breaths/min): 17 Body Mass Index (BMI): 30.4 Blood Pressure (mmHg): 135/75 Reference Range: 80 - 120 mg / dl Electronic Signature(s) Signed: 03/02/2021 5:08:54 PM By: Rhae Hammock RN Entered By: Rhae Hammock on 03/01/2021 10:00:20

## 2021-03-06 ENCOUNTER — Encounter (HOSPITAL_BASED_OUTPATIENT_CLINIC_OR_DEPARTMENT_OTHER): Payer: Medicare Other | Admitting: Internal Medicine

## 2021-03-07 ENCOUNTER — Other Ambulatory Visit: Payer: Self-pay

## 2021-03-07 ENCOUNTER — Ambulatory Visit (INDEPENDENT_AMBULATORY_CARE_PROVIDER_SITE_OTHER): Payer: Medicare Other

## 2021-03-07 DIAGNOSIS — Z0000 Encounter for general adult medical examination without abnormal findings: Secondary | ICD-10-CM

## 2021-03-07 LAB — AEROBIC/ANAEROBIC CULTURE W GRAM STAIN (SURGICAL/DEEP WOUND): Gram Stain: NONE SEEN

## 2021-03-07 NOTE — Progress Notes (Signed)
Virtual Visit via Telephone Note  I connected with  [name] on 03/07/21 at  9:30 AM EST by telephone and verified that I am speaking with the correct person using two identifiers.  Medicare Annual Wellness visit completed telephonically due to Covid-19 pandemic.   Persons participating in this call: This Health Coach and this patient.   Location: Patient: home Provider: office   I discussed the limitations, risks, security and privacy concerns of performing an evaluation and management service by telephone and the availability of in person appointments. The patient expressed understanding and agreed to proceed.  Unable to perform video visit due to video visit attempted and failed and/or patient does not have video capability.   Some vital signs may be absent or patient reported.   Willette Brace, LPN   Subjective:   [name] is a 76 y.o. female who presents for Medicare Annual (Subsequent) preventive examination.  Review of Systems     Cardiac Risk Factors include: advanced age (>58men, >57 women);obesity (BMI >30kg/m2)     Objective:    There were no vitals filed for this visit. There is no height or weight on file to calculate BMI.  Advanced Directives 03/07/2021 01/17/2021 12/31/2020 04/09/2019 01/20/2019  Does Patient Have a Medical Advance Directive? Yes No Yes Yes Yes  Type of Paramedic of [place];Living will - Living will;Healthcare Power of Attorney Living will;Healthcare Power of [place];Living will  Does patient want to make changes to medical advance directive? - - - No - Patient declined No - Patient declined  Copy of [name] in Chart? No - copy requested - - No - copy requested No - copy requested    Current Medications (verified) Outpatient Encounter Medications as of 03/07/2021  Medication Sig   acetaminophen (TYLENOL) 650 MG CR tablet Take 650 mg by mouth every 8 (eight) hours  as needed for pain.   aspirin EC 81 MG tablet Take 81 mg by mouth daily.   dorzolamide-timolol (COSOPT) 22.3-6.8 MG/ML ophthalmic solution 1 drop 2 (two) times daily.   ibuprofen (ADVIL) 200 MG tablet Take 600 mg by mouth every 6 (six) hours as needed.   levocetirizine (XYZAL) 5 MG tablet TAKE 1 TABLET BY MOUTH IN THE EVENING   Melatonin 10 MG SUBL Place 1 tablet under the tongue at bedtime.   naproxen sodium (ALEVE) 220 MG tablet Take 220 mg by mouth daily as needed.   SYSTANE ULTRA 0.4-0.3 % SOLN    triamcinolone cream (KENALOG) 0.1 % Apply 1 application topically 2 (two) times daily.   fluticasone (FLONASE) 50 MCG/ACT nasal spray Place 2 sprays into both nostrils daily. (Patient not taking: Reported on 03/07/2021)   [DISCONTINUED] benzonatate (TESSALON) 100 MG capsule Take 1 capsule (100 mg total) by mouth 3 (three) times daily as needed for cough.   [DISCONTINUED] gabapentin (NEURONTIN) 100 MG capsule    [DISCONTINUED] ibuprofen (ADVIL) 600 MG tablet Take 1 tablet (600 mg total) by mouth every 6 (six) hours as needed for mild pain.   No facility-administered encounter medications on file as of 03/07/2021.    Allergies (verified) Patient has no known allergies.   History: Past Medical History:  Diagnosis Date   Arthritis    Psoriasis    Intermittent; all over body and waxes and wanes   Tinnitus of right ear 2013   Intermittent; Melatonin has helped   Vaginal delivery 1973, 1981   Past Surgical History:  Procedure Laterality Date  goiter  1968   was on her Thyroid   IR KYPHO THORACIC WITH BONE BIOPSY  01/20/2019   JOINT REPLACEMENT     Knee replacement Right 2019   TOTAL HIP ARTHROPLASTY     Lt 2002, Rt 2005   VEIN LIGATION AND STRIPPING Right 2005   Family History  Problem Relation Age of Onset   Dementia Mother    Heart disease Father        CABG x 5   Psoriasis Sister    Osteoarthritis Sister    Pneumonia Maternal Grandfather    Heart attack Paternal Grandmother     Social History   Socioeconomic History   Marital status: Married    Spouse name: Not on file   Number of children: Not on file   Years of education: Not on file   Highest education level: Not on file  Occupational History   Not on file  Tobacco Use   Smoking status: Never   Smokeless tobacco: Never  Vaping Use   Vaping Use: Never used  Substance and Sexual Activity   Alcohol use: Never   Drug use: Never   Sexual activity: Not Currently  Other Topics Concern   Not on file  Social History Narrative   Was a Pharmacist, 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   Moved to area from [place], [place] to be closer to family    Social Determinants of Radio broadcast assistant Strain: Low Risk    Difficulty of Paying Living Expenses: Not hard at all  Food Insecurity: No Food Insecurity   Worried About Charity fundraiser in the Last Year: Never true   Arboriculturist in the Last Year: Never true  Transportation Needs: No Transportation Needs   Lack of Transportation (Medical): No   Lack of Transportation (Non-Medical): No  Physical Activity: Inactive   Days of Exercise per Week: 0 days   Minutes of Exercise per Session: 0 min  Stress: No Stress Concern Present   Feeling of Stress : Only a little  Social Connections: Moderately Integrated   Frequency of Communication with Friends and Family: More than three times a week   Frequency of Social Gatherings with Friends and Family: Twice a week   Attends Religious Services: More than 4 times per year   Active Member of [employer] or Organizations: No   Attends Music therapist: Never   Marital Status: Married    Tobacco Counseling Counseling given: Not Answered   Clinical Intake:  Pre-visit preparation completed: Yes  Pain : No/denies pain     BMI - recorded: 31.06 Nutritional Status: BMI > 30  Obese Nutritional Risks: None Diabetes: No  How often do you need to have someone  help you when you read instructions, pamphlets, or other written materials from your doctor or pharmacy?: 1 - Never  Diabetic?No  Interpreter Needed?: No  Information entered by :: Charlott Rakes, LPN   Activities of Daily Living In your present state of health, do you have any difficulty performing the following activities: 03/07/2021  Hearing? Y  Comment right ear HOH with tinnitus at times  Vision? N  Difficulty concentrating or making decisions? N  Walking or climbing stairs? N  Dressing or bathing? N  Doing errands, shopping? N  Preparing Food and eating ? N  Using the Toilet? N  In the past six months, have you accidently leaked urine? N  Do you have problems with loss of  bowel control? N  Managing your Medications? N  Managing your Finances? N  Housekeeping or managing your Housekeeping? N  Some recent data might be hidden    Patient Care Team: Inda Coke, [place] as PCP - General (Physician Assistant) Sherlynn Stalls, MD as Consulting Physician (Ophthalmology) Paulla Dolly Tamala Fothergill, DPM as Consulting Physician (Podiatry)  Indicate any recent Medical Services you may have received from other than [facility] providers in the past year (date may be approximate).     Assessment:   This is a routine wellness examination for [place].  Hearing/Vision screen Hearing Screening - Comments:: Pt stated HOH in right ear  Vision Screening - Comments:: Pt follows up with [facility] for annual eye exams   Dietary issues and exercise activities discussed: Current Exercise Habits: The patient does not participate in regular exercise at present   Goals Addressed             This Visit's Progress    Patient Stated       None at this time       Depression Screen PHQ 2/9 Scores 03/07/2021 04/09/2019 01/20/2019  PHQ - 2 Score 0 0 0    Fall Risk Fall Risk  03/07/2021 04/09/2019  Falls in the past year? 1 0  Number falls in past yr: 1 0  Injury with Fall? 1 0  Comment bruised -   Risk for fall due to : Impaired vision Orthopedic patient  Follow up Falls prevention discussed Education provided;Falls prevention discussed;Falls evaluation completed    FALL RISK PREVENTION PERTAINING TO THE HOME:  Any stairs in or around the home? Yes  If so, are there any without handrails? No  Home free of loose throw rugs in walkways, pet beds, electrical cords, etc? Yes  Adequate lighting in your home to reduce risk of falls? Yes   ASSISTIVE DEVICES UTILIZED TO PREVENT FALLS:  Life alert? No  Use of a cane, walker or w/c? No  Grab bars in the bathroom? Yes  Shower chair or bench in shower? Yes  Elevated toilet seat or a handicapped toilet? No   TIMED UP AND GO:  Was the test performed? No .   Cognitive Function:     6CIT Screen 03/07/2021 04/09/2019  What Year? 0 points 0 points  What month? 0 points 0 points  What time? 0 points 0 points  Count back from 20 0 points 0 points  Months in reverse 0 points 0 points  Repeat phrase 4 points 0 points  Total Score 4 0    Immunizations Immunization History  Administered Date(s) Administered   Fluad Quad(high Dose 65+) 10/28/2019    TDAP status: Due, Education has been provided regarding the importance of this vaccine. Advised may receive this vaccine at local pharmacy or Health Dept. Aware to provide a copy of the vaccination record if obtained from local pharmacy or Health Dept. Verbalized acceptance and understanding.  Flu Vaccine status: Due, Education has been provided regarding the importance of this vaccine. Advised may receive this vaccine at local pharmacy or Health Dept. Aware to provide a copy of the vaccination record if obtained from local pharmacy or Health Dept. Verbalized acceptance and understanding.  Pneumococcal vaccine status: Due, Education has been provided regarding the importance of this vaccine. Advised may receive this vaccine at local pharmacy or Health Dept. Aware to provide a copy of the  vaccination record if obtained from local pharmacy or Health Dept. Verbalized acceptance and understanding.  Covid-19 vaccine status: Information  provided on how to obtain vaccines.   Qualifies for Shingles Vaccine? Yes   Zostavax completed No   Shingrix Completed?: No.    Education has been provided regarding the importance of this vaccine. Patient has been advised to call insurance company to determine out of pocket expense if they have not yet received this vaccine. Advised may also receive vaccine at local pharmacy or Health Dept. Verbalized acceptance and understanding.  Screening Tests Health Maintenance  Topic Date Due   COVID-19 Vaccine (1) Never done   Hepatitis C Screening  Never done   TETANUS/TDAP  Never done   Zoster Vaccines- Shingrix (1 of 2) Never done   Pneumonia Vaccine 46+ Years old (1 - PCV) Never done   INFLUENZA VACCINE  04/14/2021 (Originally 08/15/2020)   COLONOSCOPY (Pts 45-24yrs Insurance coverage will need to be confirmed)  03/07/2022 (Originally 06/18/1990)   DEXA SCAN  Completed   HPV VACCINES  Aged Out    Health Maintenance  Health Maintenance Due  Topic Date Due   COVID-19 Vaccine (1) Never done   Hepatitis C Screening  Never done   TETANUS/TDAP  Never done   Zoster Vaccines- Shingrix (1 of 2) Never done   Pneumonia Vaccine 20+ Years old (1 - PCV) Never done    Colorectal cancer screening: No longer required. Per pt       Additional Screening:  Hepatitis C Screening: does qualify;  Vision Screening: Recommended annual ophthalmology exams for early detection of glaucoma and other disorders of the eye. Is the patient up to date with their annual eye exam?  Yes  Who is the provider or what is the name of the office in which the patient attends annual eye exams? Dr [facility] If pt is not established with a provider, would they like to be referred to a provider to establish care? No .   Dental Screening: Recommended annual dental  exams for proper oral hygiene  Community Resource Referral / Chronic Care Management: CRR required this visit?  No   CCM required this visit?  No      Plan:     I have personally reviewed and noted the following in the patients chart:   Medical and social history Use of alcohol, tobacco or illicit drugs  Current medications and supplements including opioid prescriptions.  Functional ability and status Nutritional status Physical activity Advanced directives List of other physicians Hospitalizations, surgeries, and ER visits in previous 12 months Vitals Screenings to include cognitive, depression, and falls Referrals and appointments  In addition, I have reviewed and discussed with patient certain preventive protocols, quality metrics, and best practice recommendations. A written personalized care plan for preventive services as well as general preventive health recommendations were provided to patient.     Willette Brace, LPN   [id number]   Nurse Notes: None

## 2021-03-07 NOTE — Patient Instructions (Signed)
[name] , Thank you for taking time to come for your Medicare Wellness Visit. I appreciate your ongoing commitment to your health goals. Please review the following plan we discussed and let me know if I can assist you in the future.   Screening recommendations/referrals: Colonoscopy: Declined  Recommended yearly ophthalmology/optometry visit for glaucoma screening and checkup Recommended yearly dental visit for hygiene and checkup  Vaccinations: Influenza vaccine: Due and discussed Pneumococcal vaccine: Due and discussed  Tdap vaccine: Due and discussed  Shingles vaccine: Shingrix discussed. Please contact your pharmacy for coverage information.    Covid-19:Due and discussed   Advanced directives: Please bring a copy of your health care power of attorney and living will to the office at your convenience.  Conditions/risks identified: None at this time  Next appointment: Follow up in one year for your annual wellness visit    Preventive Care 65 Years and Older, Female Preventive care refers to lifestyle choices and visits with your health care provider that can promote health and wellness. What does preventive care include? A yearly physical exam. This is also called an annual well check. Dental exams once or twice a year. Routine eye exams. Ask your health care provider how often you should have your eyes checked. Personal lifestyle choices, including: Daily care of your teeth and gums. Regular physical activity. Eating a healthy diet. Avoiding tobacco and drug use. Limiting alcohol use. Practicing safe sex. Taking low-dose aspirin every day. Taking vitamin and mineral supplements as recommended by your health care provider. What happens during an annual well check? The services and screenings done by your health care provider during your annual well check will depend on your age, overall health, lifestyle risk factors, and family history of disease. Counseling  Your  health care provider may ask you questions about your: Alcohol use. Tobacco use. Drug use. Emotional well-being. Home and relationship well-being. Sexual activity. Eating habits. History of falls. Memory and ability to understand (cognition). Work and work Astronomer. Reproductive health. Screening  You may have the following tests or measurements: Height, weight, and BMI. Blood pressure. Lipid and cholesterol levels. These may be checked every 5 years, or more frequently if you are over 77 years old. Skin check. Lung cancer screening. You may have this screening every year starting at age 77 if you have a 30-pack-year history of smoking and currently smoke or have quit within the past 15 years. Fecal occult blood test (FOBT) of the stool. You may have this test every year starting at age 71. Flexible sigmoidoscopy or colonoscopy. You may have a sigmoidoscopy every 5 years or a colonoscopy every 10 years starting at age 83. Hepatitis C blood test. Hepatitis B blood test. Sexually transmitted disease (STD) testing. Diabetes screening. This is done by checking your blood sugar (glucose) after you have not eaten for a while (fasting). You may have this done every 1-3 years. Bone density scan. This is done to screen for osteoporosis. You may have this done starting at age 13. Mammogram. This may be done every 1-2 years. Talk to your health care provider about how often you should have regular mammograms. Talk with your health care provider about your test results, treatment options, and if necessary, the need for more tests. Vaccines  Your health care provider may recommend certain vaccines, such as: Influenza vaccine. This is recommended every year. Tetanus, diphtheria, and acellular pertussis (Tdap, Td) vaccine. You may need a Td booster every 10 years. Zoster vaccine. You may need this  after age 41. Pneumococcal 13-valent conjugate (PCV13) vaccine. One dose is recommended after age  41. Pneumococcal polysaccharide (PPSV23) vaccine. One dose is recommended after age 74. Talk to your health care provider about which screenings and vaccines you need and how often you need them. This information is not intended to replace advice given to you by your health care provider. Make sure you discuss any questions you have with your health care provider. Document Released: 01/28/2015 Document Revised: 09/21/2015 Document Reviewed: 11/02/2014 Elsevier Interactive Patient Education  2017 [place] Prevention in the Home Falls can cause injuries. They can happen to people of all ages. There are many things you can do to make your home safe and to help prevent falls. What can I do on the outside of my home? Regularly fix the edges of walkways and driveways and fix any cracks. Remove anything that might make you trip as you walk through a door, such as a raised step or threshold. Trim any bushes or trees on the path to your home. Use bright outdoor lighting. Clear any walking paths of anything that might make someone trip, such as rocks or tools. Regularly check to see if handrails are loose or broken. Make sure that both sides of any steps have handrails. Any raised decks and porches should have guardrails on the edges. Have any leaves, snow, or ice cleared regularly. Use sand or salt on walking paths during winter. Clean up any spills in your garage right away. This includes oil or grease spills. What can I do in the bathroom? Use night lights. Install grab bars by the toilet and in the tub and shower. Do not use towel bars as grab bars. Use non-skid mats or decals in the tub or shower. If you need to sit down in the shower, use a plastic, non-slip stool. Keep the floor dry. Clean up any water that spills on the floor as soon as it happens. Remove soap buildup in the tub or shower regularly. Attach bath mats securely with double-sided non-slip rug tape. Do not have throw  rugs and other things on the floor that can make you trip. What can I do in the bedroom? Use night lights. Make sure that you have a light by your bed that is easy to reach. Do not use any sheets or blankets that are too big for your bed. They should not hang down onto the floor. Have a firm chair that has side arms. You can use this for support while you get dressed. Do not have throw rugs and other things on the floor that can make you trip. What can I do in the kitchen? Clean up any spills right away. Avoid walking on wet floors. Keep items that you use a lot in easy-to-reach places. If you need to reach something above you, use a strong step stool that has a grab bar. Keep electrical cords out of the way. Do not use floor polish or wax that makes floors slippery. If you must use wax, use non-skid floor wax. Do not have throw rugs and other things on the floor that can make you trip. What can I do with my stairs? Do not leave any items on the stairs. Make sure that there are handrails on both sides of the stairs and use them. Fix handrails that are broken or loose. Make sure that handrails are as long as the stairways. Check any carpeting to make sure that it is firmly attached to the  stairs. Fix any carpet that is loose or worn. Avoid having throw rugs at the top or bottom of the stairs. If you do have throw rugs, attach them to the floor with carpet tape. Make sure that you have a light switch at the top of the stairs and the bottom of the stairs. If you do not have them, ask someone to add them for you. What else can I do to help prevent falls? Wear shoes that: Do not have high heels. Have rubber bottoms. Are comfortable and fit you well. Are closed at the toe. Do not wear sandals. If you use a stepladder: Make sure that it is fully opened. Do not climb a closed stepladder. Make sure that both sides of the stepladder are locked into place. Ask someone to hold it for you, if  possible. Clearly mark and make sure that you can see: Any grab bars or handrails. First and last steps. Where the edge of each step is. Use tools that help you move around (mobility aids) if they are needed. These include: Canes. Walkers. Scooters. Crutches. Turn on the lights when you go into a dark area. Replace any light bulbs as soon as they burn out. Set up your furniture so you have a clear path. Avoid moving your furniture around. If any of your floors are uneven, fix them. If there are any pets around you, be aware of where they are. Review your medicines with your doctor. Some medicines can make you feel dizzy. This can increase your chance of falling. Ask your doctor what other things that you can do to help prevent falls. This information is not intended to replace advice given to you by your health care provider. Make sure you discuss any questions you have with your health care provider. Document Released: 10/28/2008 Document Revised: 06/09/2015 Document Reviewed: 02/05/2014 Elsevier Interactive Patient Education  2017 [employer].

## 2021-03-08 ENCOUNTER — Encounter (HOSPITAL_BASED_OUTPATIENT_CLINIC_OR_DEPARTMENT_OTHER): Payer: Medicare Other | Admitting: Internal Medicine

## 2021-03-08 DIAGNOSIS — S8011XA Contusion of right lower leg, initial encounter: Secondary | ICD-10-CM | POA: Diagnosis not present

## 2021-03-08 NOTE — Progress Notes (Signed)
[name], [name] ([id number]) Visit Report for 03/08/2021 HPI Details Patient Name: Date of Service: [name] CobbleGA RNER, [name] 03/08/2021 7:45 A M Medical Record Number: [id number] Patient Account Number: [id number] Date of Birth/Sex: Treating RN: 02/13/1945 (76 y.o. F) Primary Care Provider: Jarold MottoWorley, Samantha Other Clinician: Referring Provider: Treating Provider/Extender: Montey Horaobson, Jayna Mulnix Worley, Samantha Weeks in Treatment: 4 History of Present Illness HPI Description: ADMISSION 02/08/2021 This is a 76 year old woman who suffered a fall in early December while going down stairs.. She was seen in the ER. She was given Rocephin and Keflex. She was also followed up in the ER several days later. A DVT rule out was negative. On 1/9 she started following up with her primary doctor. Notable for the fact that she was having some bleeding. She was continued on Keflex on 1/12 using an Ace wrap. She was referred to our clinic for review. She is not on any anticoagulants although she is on aspirin. She has a history of vein ligation in 2005 when [place] was in [place] She tells me she did wear compression socks the first week that she had this done and more recently has been using Ace wraps. Past medical history includes congestive heart failure, psoriasis, arthritis, right total knee replacement, vein stripping as noted. She also has macular degeneration. The wound is on her right lateral lower extremity. Her ABIs were 1.1 in our clinic 2/1; patient was admitted the clinic last week she had a contusion on the right lateral leg. It had fully evacuated for the most part when she came here. No real problem with this is not just the physical depth but the undermining depth superiorly. Culture I did of this last week is still pending but obviously did not grow the standard worrisome organisms including staph strep and the common gram-negative's. I did not give her antibiotics. We have been using silver alginate. 2/8;  patient's wound tunneling superiorly on the right leg 4.5 cm which is an improvement. Culture I did last week was negative so showing only diphtheroids. She does not seem to need any additional systemic antibiotics. At the suggestion of our intake nurse [facility] snap VAC under compression 2/15; no improvement in the superior tunnel now 1 week with a snap VAC under compression. Our intake nurse points out an additional tunnel at 9:00 which I did not identify last time. The surface of the wound looks better. There is some drainage and she is complaining of pain 2/22; the tunnel superiorly is now down 1 cm but not the tunnel at 9:00 area this is a probing wound going superiorly. There is no superficial aspect of this. We are using a snap back under compression. She is tolerating this well Culture I did last week showed Staphylococcus Caprae which is coag negative. I don't think this had any relevance as it was only "rare" Electronic Signature(s) Signed: 03/08/2021 4:06:18 PM By: Baltazar Najjarobson, Zohal Reny MD Entered By: Baltazar Najjarobson, [name] on 03/08/2021 08:31:16 -------------------------------------------------------------------------------- Physical Exam Details Patient Name: Date of Service: [name], [name] 03/08/2021 7:45 A M Medical Record Number: [id number] Patient Account Number: [id number] Date of Birth/Sex: Treating RN: 10/13/1945 (76 y.o. F) Primary Care Provider: Jarold MottoWorley, Samantha Other Clinician: Referring Provider: Treating Provider/Extender: Montey Horaobson, Koren Sermersheim Worley, Samantha Weeks in Treatment: 4 Constitutional Sitting or standing Blood Pressure is within target range for patient.. Pulse regular and within target range for patient.Marland Kitchen. Respirations regular, non-labored and within target range.. Temperature is normal and within the target range for the  patient.Marland Kitchen Appears in no distress. Notes wound exam; probing tunnel superiorly at 3.5 minute 9:00 4. This is a slight  improvement. There is no surrounding tenderness no drainage no purulence. there is absolutely no evidence of clinical infection Electronic Signature(s) Signed: 03/08/2021 4:06:18 PM By: Baltazar Najjar MD Entered By: Baltazar Najjar on 03/08/2021 08:33:04 -------------------------------------------------------------------------------- Physician Orders Details Patient Name: Date of Service: Earle [name], [name] 03/08/2021 7:45 A M Medical Record Number: [id number] Patient Account Number: [id number] Date of Birth/Sex: Treating RN: Jun 12, 1945 (76 y.o. Wynelle Link Primary Care Provider: Jarold [name] Other Clinician: Referring Provider: Treating Provider/Extender: Montey Hora in Treatment: 4 Verbal / Phone Orders: No Diagnosis Coding ICD-10 Coding Code Description S80.11XD Contusion of right lower leg, subsequent encounter L97.818 Non-pressure chronic ulcer of other part of right lower leg with other specified severity I87.301 Chronic venous hypertension (idiopathic) without complications of right lower extremity Follow-up Appointments ppointment in 1 week. - Dr Leanord Hawking - Room 2 Return A Bathing/ Shower/ Hygiene May shower with protection but do not get wound dressing(s) wet. Negative Presssure Wound Therapy Wound #1 Right,Lateral Lower Leg SNAP Vac to wound continuously at 131mm/hg pressure Edema Control - Lymphedema / SCD / Other Right Lower Extremity Elevate legs to the level of the heart or above for 30 minutes daily and/or when sitting, a frequency of: - throughout the day void standing for long periods of time. - Elevate legs throughout the day A Exercise regularly Wound Treatment Wound #1 - Lower Leg Wound Laterality: Right, Lateral Cleanser: Soap and Water 1 x Per Week Discharge Instructions: cover right leg. May shower. Peri-Wound Care: Sween Lotion (Moisturizing lotion) 1 x Per Week Discharge Instructions: Apply moisturizing lotion as  directed Topical: Mupirocin Ointment 1 x Per Week Discharge Instructions: Apply Mupirocin (Bactroban) as instructed Prim Dressing: SNAP Therapy System Dressing, 6x6 (in/in) ary 1 x Per Week Compression Wrap: ThreePress (3 layer compression wrap) 1 x Per Week Discharge Instructions: Apply three layer compression as directed. Electronic Signature(s) Signed: 03/08/2021 4:06:18 PM By: Baltazar Najjar MD Signed: 03/08/2021 5:11:06 PM By: Zandra Abts RN, BSN Entered By: Zandra Abts on 03/08/2021 08:45:43 -------------------------------------------------------------------------------- Problem List Details Patient Name: Date of Service: [name], [name] 03/08/2021 7:45 A M Medical Record Number: [id number] Patient Account Number: [id number] Date of Birth/Sex: Treating RN: December 25, 1945 (76 y.o. F) Primary Care Provider: Jarold [name] Other Clinician: Referring Provider: Treating Provider/Extender: Montey Hora in Treatment: 4 Active Problems ICD-10 Encounter Code Description Active Date MDM Diagnosis S80.11XD Contusion of right lower leg, subsequent encounter 02/08/2021 No Yes L97.818 Non-pressure chronic ulcer of other part of right lower leg with other specified 02/08/2021 No Yes severity I87.301 Chronic venous hypertension (idiopathic) without complications of right lower 02/08/2021 No Yes extremity Inactive Problems Resolved Problems Electronic Signature(s) Signed: 03/08/2021 4:06:18 PM By: Baltazar Najjar MD Previous Signature: 03/08/2021 8:22:14 AM Version By: Karl Ito Entered By: Baltazar Najjar on 03/08/2021 08:27:36 -------------------------------------------------------------------------------- Progress Note Details Patient Name: Date of Service: Earle [name], [name] 03/08/2021 7:45 A M Medical Record Number: [id number] Patient Account Number: [id number] Date of Birth/Sex: Treating RN: [birth year] (76 y.o. F) Primary Care Provider: Jarold [name]  Other Clinician: Referring Provider: Treating Provider/Extender: Montey Hora in Treatment: 4 Subjective History of Present Illness (HPI) ADMISSION 02/08/2021 This is a 76 year old woman who suffered a fall in early December while going down stairs.. She was seen in the ER. She was given Rocephin and Keflex. She was also followed up in the ER several  days later. A DVT rule out was negative. On 1/9 she started following up with her primary doctor. Notable for the fact that she was having some bleeding. She was continued on Keflex on 1/12 using an Ace wrap. She was referred to our clinic for review. She is not on any anticoagulants although she is on aspirin. She has a history of vein ligation in 2005 when she was in [place]. She tells me she did wear compression socks the first week that she had this done and more recently has been using Ace wraps. Past medical history includes congestive heart failure, psoriasis, arthritis, right total knee replacement, vein stripping as noted. She also has macular degeneration. The wound is on her right lateral lower extremity. Her ABIs were 1.1 in our clinic 2/1; patient was admitted the clinic last week she had a contusion on the right lateral leg. It had fully evacuated for the most part when she came here. No real problem with this is not just the physical depth but the undermining depth superiorly. Culture I did of this last week is still pending but obviously did not grow the standard worrisome organisms including staph strep and the common gram-negative's. I did not give her antibiotics. We have been using silver alginate. 2/8; patient's wound tunneling superiorly on the right leg 4.5 cm which is an improvement. Culture I did last week was negative so showing only diphtheroids. She does not seem to need any additional systemic antibiotics. At the suggestion of our intake nurse I think the best [facility] choice here would be a  snap VAC under compression 2/15; no improvement in the superior tunnel now 1 week with a snap VAC under compression. Our intake nurse points out an additional tunnel at 9:00 which I did not identify last time. The surface of the wound looks better. There is some drainage and she is complaining of pain 2/22; the tunnel superiorly is now down 1 cm but not the tunnel at 9:00 area this is a probing wound going superiorly. There is no superficial aspect of this. We are using a snap back under compression. She is tolerating this well Culture I did last week showed Staphylococcus Caprae which is coag negative. I don't think this had any relevance as it was only "rare" Objective Constitutional Sitting or standing Blood Pressure is within target range for patient.. Pulse regular and within target range for patient.Marland Kitchen Respirations regular, non-labored and within target range.. Temperature is normal and within the target range for the patient.Marland Kitchen Appears in no distress. Vitals Time Taken: 7:54 AM, Height: 68 in, Weight: 200 lbs, BMI: 30.4, Temperature: 97.,9 F, Pulse: 76 bpm, Respiratory Rate: 17 breaths/min, Blood Pressure: 118/75 mmHg. General Notes: wound exam; probing tunnel superiorly at 3.5 minute 9:00 4. This is a slight improvement. There is no surrounding tenderness no drainage no purulence. there is absolutely no evidence of clinical infection Integumentary (Hair, Skin) Wound #1 status is Open. Original cause of wound was Trauma. The date acquired was: 12/15/2020. The wound has been in treatment 4 weeks. The wound is located on the Right,Lateral Lower Leg. The wound measures 1.3cm length x 0.5cm width x 0.8cm depth; 0.511cm^2 area and 0.408cm^3 volume. There is Fat Layer (Subcutaneous Tissue) exposed. Tunneling has been noted at 12:00 with a maximum distance of 4cm. There is additional tunneling and at 9:00 with a maximum distance of 3.5cm. Undermining begins at 12:00 and ends at 12:00 with a maximum  distance of 1.2cm. There is a medium amount  of serosanguineous drainage noted. The wound margin is distinct with the outline attached to the wound base. There is large (67-100%) red granulation within the wound bed. There is a small (1-33%) amount of necrotic tissue within the wound bed including Adherent Slough. Assessment Active Problems ICD-10 Contusion of right lower leg, subsequent encounter Non-pressure chronic ulcer of other part of right lower leg with other specified severity Chronic venous hypertension (idiopathic) without complications of right lower extremity Procedures Wound #1 Pre-procedure diagnosis of Wound #1 is a Venous Leg Ulcer located on the Right,Lateral Lower Leg . There was a Three Layer Compression Therapy Procedure Post procedure Diagnosis Wound #1: Same as Pre-Procedure Plan Follow-up Appointments: Return Appointment in 1 week. - Dr Leanord Hawking - Room 2 Bathing/ Shower/ Hygiene: May shower with protection but do not get wound dressing(s) wet. Negative Presssure Wound Therapy: Wound #1 Right,Lateral Lower Leg: SNAP Vac to wound continuously at 130mm/hg pressure Edema Control - Lymphedema / SCD / Other: Elevate legs to the level of the heart or above for 30 minutes daily and/or when sitting, a frequency of: - throughout the day Avoid standing for long periods of time. - Elevate legs throughout the day Exercise regularly WOUND #1: - Lower Leg Wound Laterality: Right, Lateral Cleanser: Soap and Water 1 x Per Week/ Discharge Instructions: cover right leg. May shower. Peri-Wound Care: Sween Lotion (Moisturizing lotion) 1 x Per Week/ Discharge Instructions: Apply moisturizing lotion as directed Topical: Mupirocin Ointment 1 x Per Week/ Discharge Instructions: Apply Mupirocin (Bactroban) as instructed Prim Dressing: SNAP Therapy System Dressing, 6x6 (in/in) 1 x Per Week/ ary Com pression Wrap: ThreePress (3 layer compression wrap) 1 x Per Week/ Discharge Instructions:  Apply three layer compression as directed. #1 we continue with the snap Vac under 3 layer compression #2 Staphylococcus Ray which I only cultured and rare amounts I think is inconsequential. #3 nevertheless I'm get a probe Bactroban into some of the tunnels Electronic Signature(s) Signed: 03/08/2021 4:06:18 PM By: Baltazar Najjar MD Entered By: Baltazar Najjar on 03/08/2021 08:34:49 -------------------------------------------------------------------------------- SuperBill Details Patient Name: Date of Service: Earle [name], [name] 03/08/2021 Medical Record Number: [id number] Patient Account Number: [id number] Date of Birth/Sex: Treating RN: Jul 04, 1945 (76 y.o. F) Primary Care Provider: Jarold [name] Other Clinician: Referring Provider: Treating Provider/Extender: Montey Hora in Treatment: 4 Diagnosis Coding ICD-10 Codes Code Description S80.11XD Contusion of right lower leg, subsequent encounter L97.818 Non-pressure chronic ulcer of other part of right lower leg with other specified severity I87.301 Chronic venous hypertension (idiopathic) without complications of right lower extremity Facility Procedures CPT4 Code: 01779390 Description: 97605 - WOUND VAC-50 SQ CM OR LESS Modifier: Quantity: 1 CPT4 Code: 30092330 Description: (Facility Use Only) [id number] - APPLY MULTLAY COMPRS LWR RT LEG Modifier: Quantity: 1 Physician Procedures : CPT4 Code Description Modifier [id number] - WC PHYS LEVEL 3 - EST PT ICD-10 Diagnosis Description S80.11XD Contusion of right lower leg, subsequent encounter L97.818 Non-pressure chronic ulcer of other part of right lower leg with other specified  severity I87.301 Chronic venous hypertension (idiopathic) without complications of right lower extremity Quantity: 1 Electronic Signature(s) Signed: 03/08/2021 4:06:18 PM By: Baltazar Najjar MD Signed: 03/08/2021 5:11:06 PM By: Zandra Abts RN, BSN Entered By: Zandra Abts on  03/08/2021 11:49:49

## 2021-03-08 NOTE — Progress Notes (Addendum)
Golonka, Aleja ([id number]) Visit Report for 03/08/2021 Arrival Information Details Patient Name: Date of Service: ROBINA, HAMOR 03/08/2021 7:45 A M Medical Record Number: [id number] Patient Account Number: [id number] Date of Birth/Sex: Treating RN: 1945/10/05 (76 y.o. F) Primary Care Blessing Ozga: [name] Other Clinician: Referring Khyan Oats: Treating Nithya Meriweather/Extender: Montey Hora in Treatment: 4 Visit Information History Since Last Visit Added or deleted any medications: No Patient Arrived: Ambulatory Any new allergies or adverse reactions: No Arrival Time: 07:54 Had a fall or experienced change in No Accompanied By: self activities of daily living that may affect Transfer Assistance: None risk of falls: Patient Identification Verified: Yes Signs or symptoms of abuse/neglect since last visito No Secondary Verification Process Completed: Yes Hospitalized since last visit: No Patient Requires Transmission-Based Precautions: No Implantable device outside of the clinic excluding No Patient Has Alerts: No cellular tissue based products placed in the center since last visit: Has Dressing in Place as Prescribed: Yes Pain Present Now: No Electronic Signature(s) Signed: 03/08/2021 8:22:14 AM By: Karl Ito Entered By: Karl Ito on 03/08/2021 07:54:29 -------------------------------------------------------------------------------- Compression Therapy Details Patient Name: Date of Service: [name], Laquasha 03/08/2021 7:45 A M Medical Record Number: [id number] Patient Account Number: [id number] Date of Birth/Sex: Treating RN: Nov 12, 1945 (76 y.o. F) Primary Care Kearstyn Avitia: [name] Other Clinician: Referring Lindi Abram: Treating Valery Chance/Extender: Montey Hora in Treatment: 4 Compression Therapy Performed for Wound Assessment: Wound #1 Right,Lateral Lower Leg Performed By: Clinician , Compression Type: Three  Layer Post Procedure Diagnosis Same as Pre-procedure Electronic Signature(s) Signed: 03/09/2021 2:56:00 PM By: Karl Ito Entered By: Karl Ito on 03/08/2021 08:25:11 -------------------------------------------------------------------------------- Encounter Discharge Information Details Patient Name: Date of Service: [name], Ivyonna 03/08/2021 7:45 A M Medical Record Number: [id number] Patient Account Number: [id number] Date of Birth/Sex: Treating RN: 1945-09-04 (76 y.o. Wynelle Link Primary Care Nickalous Stingley: [name] Other Clinician: Referring Angeleigh Chiasson: Treating Gerarda Conklin/Extender: Montey Hora in Treatment: 4 Encounter Discharge Information Items Discharge Condition: Stable Ambulatory Status: Ambulatory Discharge Destination: Home Transportation: Private Auto Accompanied By: alone Schedule Follow-up Appointment: Yes Clinical Summary of Care: Patient Declined Electronic Signature(s) Signed: 03/08/2021 5:11:06 PM By: Zandra Abts RN, BSN Entered By: Zandra Abts on 03/08/2021 11:50:29 -------------------------------------------------------------------------------- Lower Extremity Assessment Details Patient Name: Date of Service: [name], Alvina 03/08/2021 7:45 A M Medical Record Number: [id number] Patient Account Number: [id number] Date of Birth/Sex: Treating RN: 23-Feb-1945 (76 y.o. F) Primary Care Lailah Marcelli: [name] Other Clinician: Referring Airika Alkhatib: Treating Shuntell Foody/Extender: Montey Hora in Treatment: 4 Edema Assessment Assessed: [Left: No] [Right: No] Edema: [Left: Ye] [Right: s] Calf Left: Right: Point of Measurement: 33 cm From Medial Instep 37 cm Ankle Left: Right: Point of Measurement: 7 cm From Medial Instep 23 cm Vascular Assessment Pulses: Dorsalis Pedis Palpable: [Right:Yes] Electronic Signature(s) Signed: 03/08/2021 8:22:14 AM By: Karl Ito Entered By: Karl Ito on 03/08/2021 08:18:52 -------------------------------------------------------------------------------- Multi Wound Chart Details Patient Name: Date of Service: [name], Shakthi 03/08/2021 7:45 A M Medical Record Number: [id number] Patient Account Number: [id number] Date of Birth/Sex: Treating RN: 09/11/1945 (76 y.o. F) Primary Care Willie Plain: [name] Other Clinician: Referring Leighton Luster: Treating Shamond Skelton/Extender: Montey Hora in Treatment: 4 Vital Signs Height(in): 68 Pulse(bpm): 76 Weight(lbs): 200 Blood Pressure(mmHg): 118/75 Body Mass Index(BMI): 30.4 Temperature(F): 97.,9 Respiratory Rate(breaths/min): 17 Photos: [N/A:N/A] Right, Lateral Lower Leg N/A N/A Wound Location: Trauma N/A N/A Wounding Event: Venous Leg Ulcer N/A N/A Primary Etiology: Osteoarthritis N/A N/A Comorbid History: 12/15/2020 N/A N/A Date  Acquired: 4 N/A N/A Weeks of Treatment: Open N/A N/A Wound Status: No N/A N/A Wound Recurrence: 1.3x0.5x0.8 N/A N/A Measurements L x W x D (cm) 0.511 N/A N/A A (cm) : rea 0.408 N/A N/A Volume (cm) : 74.00% N/A N/A % Reduction in A rea: 89.60% N/A N/A % Reduction in Volume: 12 Position 1 (o'clock): 4 Maximum Distance 1 (cm): 9 Position 2 (o'clock): 3.5 Maximum Distance 2 (cm): 12 Starting Position 1 (o'clock): 12 Ending Position 1 (o'clock): 1.2 Maximum Distance 1 (cm): Yes N/A N/A Tunneling: Yes N/A N/A Undermining: Full Thickness With Exposed Support N/A N/A Classification: Structures Medium N/A N/A Exudate Amount: Serosanguineous N/A N/A Exudate Type: red, brown N/A N/A Exudate Color: Distinct, outline attached N/A N/A Wound Margin: Large (67-100%) N/A N/A Granulation Amount: Red N/A N/A Granulation Quality: Small (1-33%) N/A N/A Necrotic Amount: Fat Layer (Subcutaneous Tissue): Yes N/A N/A Exposed Structures: Fascia: No Tendon: No Muscle: No Joint: No Bone: No None N/A  N/A Epithelialization: Compression Therapy N/A N/A Procedures Performed: Negative Pressure Wound Therapy Maintenance (NPWT) Treatment Notes Electronic Signature(s) Signed: 03/08/2021 4:06:18 PM By: Baltazar Najjar MD Entered By: Baltazar Najjar on 03/08/2021 08:27:44 -------------------------------------------------------------------------------- Multi-Disciplinary Care Plan Details Patient Name: Date of Service: [name], Safiyyah 03/08/2021 7:45 A M Medical Record Number: [id number] Patient Account Number: [id number] Date of Birth/Sex: Treating RN: [birth year] (76 y.o. F) Primary Care Omare Bilotta: [name] Other Clinician: Referring Brice Potteiger: Treating Emmalena Canny/Extender: Montey Hora in Treatment: 4 Multidisciplinary Care Plan reviewed with physician Active Inactive Venous Leg Ulcer Nursing Diagnoses: Knowledge deficit related to disease process and management Potential for venous Insuffiency (use before diagnosis confirmed) Goals: Patient will maintain optimal edema control Date Initiated: 02/22/2021 Target Resolution Date: 03/22/2021 Goal Status: Active Interventions: Assess peripheral edema status every visit. Compression as ordered Provide education on venous insufficiency Treatment Activities: Therapeutic compression applied : 02/22/2021 Notes: Wound/Skin Impairment Nursing Diagnoses: Impaired tissue integrity Goals: Patient/caregiver will verbalize understanding of skin care regimen Date Initiated: 02/08/2021 Target Resolution Date: 03/10/2021 Goal Status: Active Interventions: Assess ulceration(s) every visit Notes: Electronic Signature(s) Signed: 03/08/2021 8:22:14 AM By: Karl Ito Entered By: Karl Ito on 03/08/2021 08:21:45 -------------------------------------------------------------------------------- Negative Pressure Wound Therapy Maintenance (NPWT) Details Patient Name: Date of Service: JAYANI, ROZMAN 03/08/2021  7:45 A M Medical Record Number: [id number] Patient Account Number: [id number] Date of Birth/Sex: Treating RN: 03-31-1945 (76 y.o. F) Primary Care Shellie Rogoff: [name] Other Clinician: Referring Brittny Spangle: Treating Emberly Tomasso/Extender: Montey Hora in Treatment: 4 NPWT Maintenance Performed for: Wound #1 Right, Lateral Lower Leg Performed By: Zandra Abts, RN Type: Other Coverage Size (sq cm): 0.65 Pressure Type: Constant Pressure Setting: 125 mmHG Drain Type: Sponge/Dressing Type: Foam, Blue Date Initiated: 02/22/2021 Dressing Removed: Yes Canister Changed: Yes Canister Exudate Volume: 45 Dressing Reapplied: Yes Quantity of Sponges/Gauze Inserted: 2 pieces blue foam Respones T Treatment: o pt tolerated well Days On NPWT : 15 Post Procedure Diagnosis Same as Pre-procedure Electronic Signature(s) Signed: 03/09/2021 2:56:00 PM By: Karl Ito Entered By: Karl Ito on 03/08/2021 08:26:16 -------------------------------------------------------------------------------- Pain Assessment Details Patient Name: Date of Service: [name], Brytney 03/08/2021 7:45 A M Medical Record Number: [id number] Patient Account Number: [id number] Date of Birth/Sex: Treating RN: 1945-09-27 (76 y.o. F) Primary Care Rudell Ortman: [name] Other Clinician: Referring Saunders [place]: Treating Linnae Rasool/Extender: Montey Hora in Treatment: 4 Active Problems Location of Pain Severity and Description of Pain Patient Has Paino No Site Locations Pain Management and Medication Current Pain Management: Electronic Signature(s) Signed: 03/08/2021 8:22:14  AM By: Karl Ito Entered By: Karl Ito on 03/08/2021 07:54:52 -------------------------------------------------------------------------------- Patient/Caregiver Education Details Patient Name: Date of Service: [name], Lashe 2/22/2023andnbsp7:45 A M Medical Record Number:  [id number] Patient Account Number: [id number] Date of Birth/Gender: Treating RN: [birth year] (77 y.o. F) Primary Care Physician: [name] Other Clinician: Referring Physician: Treating Physician/Extender: Montey Hora in Treatment: 4 Education Assessment Education Provided To: Patient Education Topics Provided Wound/Skin Impairment: Methods: Explain/Verbal Responses: State content correctly Electronic Signature(s) Signed: 03/08/2021 8:22:14 AM By: Karl Ito Entered By: Karl Ito on 03/08/2021 08:22:00 -------------------------------------------------------------------------------- Wound Assessment Details Patient Name: Date of Service: [name], Takyah 03/08/2021 7:45 A M Medical Record Number: [id number] Patient Account Number: [id number] Date of Birth/Sex: Treating RN: 08/18/1945 (76 y.o. F) Primary Care Calayah Guadarrama: [name] Other Clinician: Referring Jocilyn Trego: Treating Kuper Rennels/Extender: Montey Hora in Treatment: 4 Wound Status Wound Number: 1 Primary Etiology: Venous Leg Ulcer Wound Location: Right, Lateral Lower Leg Wound Status: Open Wounding Event: Trauma Comorbid History: Osteoarthritis Date Acquired: 12/15/2020 Weeks Of Treatment: 4 Clustered Wound: No Photos Wound Measurements Length: (cm) 1.3 Width: (cm) 0.5 Depth: (cm) 0.8 Area: (cm) 0.511 Volume: (cm) 0.408 % Reduction in Area: 74% % Reduction in Volume: 89.6% Epithelialization: None Tunneling: Yes Location 1 Position (o'clock): 12 Maximum Distance: (cm) 4 Location 2 Position (o'clock): 9 Maximum Distance: (cm) 3.5 Undermining: Yes Starting Position (o'clock): 12 Ending Position (o'clock): 12 Maximum Distance: (cm) 1.2 Wound Description Classification: Full Thickness With Exposed Support Structures Wound Margin: Distinct, outline attached Exudate Amount: Medium Exudate Type: Serosanguineous Exudate Color: red,  brown Foul Odor After Cleansing: No Slough/Fibrino Yes Wound Bed Granulation Amount: Large (67-100%) Exposed Structure Granulation Quality: Red Fascia Exposed: No Necrotic Amount: Small (1-33%) Fat Layer (Subcutaneous Tissue) Exposed: Yes Necrotic Quality: Adherent Slough Tendon Exposed: No Muscle Exposed: No Joint Exposed: No Bone Exposed: No Treatment Notes Wound #1 (Lower Leg) Wound Laterality: Right, Lateral Cleanser Soap and Water Discharge Instruction: cover right leg. May shower. Peri-Wound Care Sween Lotion (Moisturizing lotion) Discharge Instruction: Apply moisturizing lotion as directed Topical Mupirocin Ointment Discharge Instruction: Apply Mupirocin (Bactroban) as instructed Primary Dressing SNAP Therapy System Dressing, 6x6 (in/in) Secondary Dressing Secured With Compression Wrap ThreePress (3 layer compression wrap) Discharge Instruction: Apply three layer compression as directed. Compression Stockings Add-Ons Electronic Signature(s) Signed: 03/08/2021 8:22:14 AM By: Karl Ito Entered By: Karl Ito on 03/08/2021 08:18:12 -------------------------------------------------------------------------------- Vitals Details Patient Name: Date of Service: [name], Maricarmen 03/08/2021 7:45 A M Medical Record Number: [id number] Patient Account Number: [id number] Date of Birth/Sex: Treating RN: April 25, 1945 (76 y.o. F) Primary Care Maysa Lynn: [name] Other Clinician: Referring Isobelle Tuckett: Treating Quana Chamberlain/Extender: Montey Hora in Treatment: 4 Vital Signs Time Taken: 07:54 Temperature (F): 97.,9 Height (in): 68 Pulse (bpm): 76 Weight (lbs): 200 Respiratory Rate (breaths/min): 17 Body Mass Index (BMI): 30.4 Blood Pressure (mmHg): 118/75 Reference Range: 80 - 120 mg / dl Electronic Signature(s) Signed: 03/08/2021 8:22:14 AM By: Karl Ito Entered By: Karl Ito on 03/08/2021 07:54:46

## 2021-03-15 ENCOUNTER — Encounter (HOSPITAL_BASED_OUTPATIENT_CLINIC_OR_DEPARTMENT_OTHER): Payer: Medicare Other | Attending: Internal Medicine | Admitting: General Surgery

## 2021-03-15 ENCOUNTER — Other Ambulatory Visit: Payer: Self-pay

## 2021-03-15 DIAGNOSIS — W109XXA Fall (on) (from) unspecified stairs and steps, initial encounter: Secondary | ICD-10-CM | POA: Insufficient documentation

## 2021-03-15 DIAGNOSIS — Z96651 Presence of right artificial knee joint: Secondary | ICD-10-CM | POA: Insufficient documentation

## 2021-03-15 DIAGNOSIS — L97818 Non-pressure chronic ulcer of other part of right lower leg with other specified severity: Secondary | ICD-10-CM | POA: Insufficient documentation

## 2021-03-15 DIAGNOSIS — H3530 Unspecified macular degeneration: Secondary | ICD-10-CM | POA: Diagnosis not present

## 2021-03-15 DIAGNOSIS — I509 Heart failure, unspecified: Secondary | ICD-10-CM | POA: Insufficient documentation

## 2021-03-15 DIAGNOSIS — I87301 Chronic venous hypertension (idiopathic) without complications of right lower extremity: Secondary | ICD-10-CM | POA: Insufficient documentation

## 2021-03-15 DIAGNOSIS — L409 Psoriasis, unspecified: Secondary | ICD-10-CM | POA: Insufficient documentation

## 2021-03-15 DIAGNOSIS — S8011XA Contusion of right lower leg, initial encounter: Secondary | ICD-10-CM | POA: Insufficient documentation

## 2021-03-15 NOTE — Progress Notes (Signed)
[name] ([id number]) Visit Report for 03/15/2021 Chief Complaint Document Details Patient Name: Date of Service: [name], [name] 03/15/2021 8:30 A M Medical Record Number: [id number] Patient Account Number: [id number] Date of Birth/Sex: Treating RN: 1945-12-19 (75 y.o. F) Primary Care Provider: Inda Coke Other Clinician: Referring Provider: Treating Provider/Extender: Fausto Skillern in Treatment: 5 Information Obtained from: Patient Chief Complaint 02/08/2021; patient is here for review of a wound on the right lateral lower leg which was initially traumatic Electronic Signature(s) Signed: 03/15/2021 9:24:17 AM By: Fredirick Maudlin MD FACS Entered By: Fredirick Maudlin on 03/15/2021 09:24:17 -------------------------------------------------------------------------------- Debridement Details Patient Name: Date of Service: [name] 03/15/2021 8:30 A M Medical Record Number: [id number] Patient Account Number: [id number] Date of Birth/Sex: Treating RN: 1945/10/16 (76 y.o. Nancy Fetter Primary Care Provider: Inda Coke Other Clinician: Referring Provider: Treating Provider/Extender: Alcario Drought Weeks in Treatment: 5 Debridement Performed for Assessment: Wound #1 Right,Lateral Lower Leg Performed By: Physician Fredirick Maudlin, MD Debridement Type: Debridement Severity of Tissue Pre Debridement: Fat layer exposed Level of Consciousness (Pre-procedure): Awake and Alert Pre-procedure Verification/Time Out Yes - 09:14 Taken: Start Time: 09:14 T Area Debrided (L x W): otal 1 (cm) x 0.4 (cm) = 0.4 (cm) Tissue and other material debrided: Non-Viable, Slough, Slough Level: Non-Viable Tissue Debridement Description: Selective/Open Wound Instrument: Curette Bleeding: Minimum Hemostasis Achieved: Pressure End Time: 09:15 Procedural Pain: 0 Post Procedural Pain: 0 Response to Treatment: Procedure was tolerated well Level of  Consciousness (Post- Awake and Alert procedure): Post Debridement Measurements of Total Wound Length: (cm) 1 Width: (cm) 0.4 Depth: (cm) 0.8 Volume: (cm) 0.251 Character of Wound/Ulcer Post Debridement: Improved Severity of Tissue Post Debridement: Fat layer exposed Post Procedure Diagnosis Same as Pre-procedure Electronic Signature(s) Signed: 03/15/2021 9:29:38 AM By: Fredirick Maudlin MD FACS Signed: 03/15/2021 5:58:06 PM By: Levan Hurst RN, BSN Entered By: Levan Hurst on 03/15/2021 09:17:10 -------------------------------------------------------------------------------- HPI Details Patient Name: Date of Service: [name] 03/15/2021 8:30 A M Medical Record Number: [id number] Patient Account Number: [id number] Date of Birth/Sex: Treating RN: [birth year] (75 y.o. F) Primary Care Provider: Inda Coke Other Clinician: Referring Provider: Treating Provider/Extender: Alcario Drought Weeks in Treatment: 5 History of Present Illness HPI Description: ADMISSION 02/08/2021 This is a 76 year old woman who suffered a fall in early December while going down stairs.. She was seen in the ER. She was given Rocephin and Keflex. She was also followed up in the ER several days later. A DVT rule out was negative. On 1/9 she started following up with her primary doctor. Notable for the fact that she was having some bleeding. She was continued on Keflex on 1/12 using an Ace wrap. She was referred to our clinic for review. She is not on any anticoagulants although she is on aspirin. She has a history of vein ligation in 2005 when she was in [place]. She tells me she did wear compression socks the first week that she had this done and more recently has been using Ace wraps. Past medical history includes congestive heart failure, psoriasis, arthritis, right total knee replacement, vein stripping as noted. She also has macular degeneration. The wound is on her right lateral  lower extremity. Her ABIs were 1.1 in our clinic 2/1; patient was admitted the clinic last week she had a contusion on the right lateral leg. It had fully evacuated for the most part when she came here. No real problem with this is not just the physical depth but the undermining  depth superiorly. Culture I did of this last week is still pending but obviously did not grow the standard worrisome organisms including staph strep and the common gram-negative's. I did not give her antibiotics. We have been using silver alginate. 2/8; patient's wound tunneling superiorly on the right leg 4.5 cm which is an improvement. Culture I did last week was negative so showing only diphtheroids. She does not seem to need any additional systemic antibiotics. At the suggestion of our intake nurse I think the best [facility] choice here would be a snap VAC under compression 2/15; no improvement in the superior tunnel now 1 week with a snap VAC under compression. Our intake nurse points out an additional tunnel at 9:00 which I did not identify last time. The surface of the wound looks better. There is some drainage and she is complaining of pain 2/22; the tunnel superiorly is now down 1 cm but not the tunnel at 9:00 area this is a probing wound going superiorly. There is no superficial aspect of this. We are using a snap back under compression. She is tolerating this well Culture I did last week showed Staphylococcus Caprae which is coag negative. I don't think this had any relevance as it was only "rare" 03/15/2021: The tunneling continues to improve. Overall, the wound dimensions are roughly the same. Based upon the coag negative staph culture, Dr. Dellia Nims added mupirocin. She continues in a snap VAC with 3 layer compression. Electronic Signature(s) Signed: 03/15/2021 9:26:08 AM By: Fredirick Maudlin MD FACS Entered By: Fredirick Maudlin on 03/15/2021  09:26:08 -------------------------------------------------------------------------------- Physical Exam Details Patient Name: Date of Service: [name] 03/15/2021 8:30 A M Medical Record Number: [id number] Patient Account Number: [id number] Date of Birth/Sex: Treating RN: [birth year] (76 y.o. F) Primary Care Provider: Inda Coke Other Clinician: Referring Provider: Treating Provider/Extender: Alcario Drought Weeks in Treatment: 5 Constitutional . . . No acute distress. Respiratory Normal work of breathing on room air. Notes 03/15/2021: Wound examthe tunneling has improved somewhat. No erythema, or induration. Slight serous drainage, no purulence. Electronic Signature(s) Signed: 03/15/2021 9:27:47 AM By: Fredirick Maudlin MD FACS Entered By: Fredirick Maudlin on 03/15/2021 09:27:47 -------------------------------------------------------------------------------- Physician Orders Details Patient Name: Date of Service: [name] RNER, [place] 03/15/2021 8:30 A M Medical Record Number: [id number] Patient Account Number: [id number] Date of Birth/Sex: Treating RN: Sep 20, 1945 (76 y.o. Nancy Fetter Primary Care Provider: Inda Coke Other Clinician: Referring Provider: Treating Provider/Extender: Alcario Drought Weeks in Treatment: 5 Verbal / Phone Orders: No Diagnosis Coding ICD-10 Coding Code Description S80.11XD Contusion of right lower leg, subsequent encounter L97.818 Non-pressure chronic ulcer of other part of right lower leg with other specified severity I87.301 Chronic venous hypertension (idiopathic) without complications of right lower extremity Follow-up Appointments ppointment in 1 week. - Dr Celine Ahr Return A Bathing/ Shower/ Hygiene May shower with protection but do not get wound dressing(s) wet. Negative Presssure Wound Therapy Wound #1 Right,Lateral Lower Leg SNAP Vac to wound continuously at 155mm/hg pressure Blue Foam Edema  Control - Lymphedema / SCD / Other Right Lower Extremity Elevate legs to the level of the heart or above for 30 minutes daily and/or when sitting, a frequency of: - throughout the day void standing for long periods of time. - Elevate legs throughout the day A Exercise regularly Wound Treatment Wound #1 - Lower Leg Wound Laterality: Right, Lateral Cleanser: Soap and Water 1 x Per Week Discharge Instructions: cover right leg. May shower. Peri-Wound Care: Sween Lotion (Moisturizing lotion) 1 x Per  Week Discharge Instructions: Apply moisturizing lotion as directed Topical: Mupirocin Ointment 1 x Per Week Discharge Instructions: Apply Mupirocin (Bactroban) as instructed Prim Dressing: SNAP Therapy System Dressing, 6x6 (in/in) ary 1 x Per Week Compression Wrap: ThreePress (3 layer compression wrap) 1 x Per Week Discharge Instructions: Apply three layer compression as directed. Electronic Signature(s) Signed: 03/15/2021 9:29:38 AM By: Fredirick Maudlin MD FACS Entered By: Fredirick Maudlin on 03/15/2021 09:28:17 -------------------------------------------------------------------------------- Problem List Details Patient Name: Date of Service: [name] RNER, Duanna 03/15/2021 8:30 A M Medical Record Number: [id number] Patient Account Number: [id number] Date of Birth/Sex: Treating RN: 1945-11-08 (76 y.o. Nancy Fetter Primary Care Provider: Inda Coke Other Clinician: Referring Provider: Treating Provider/Extender: Alcario Drought Weeks in Treatment: 5 Active Problems ICD-10 Encounter Code Description Active Date MDM Diagnosis S80.11XD Contusion of right lower leg, subsequent encounter 02/08/2021 No Yes L97.818 Non-pressure chronic ulcer of other part of right lower leg with other specified 02/08/2021 No Yes severity I87.301 Chronic venous hypertension (idiopathic) without complications of right lower 02/08/2021 No Yes extremity Inactive Problems Resolved  Problems Electronic Signature(s) Signed: 03/15/2021 9:23:41 AM By: Fredirick Maudlin MD FACS Entered By: Fredirick Maudlin on 03/15/2021 09:23:40 -------------------------------------------------------------------------------- Progress Note Details Patient Name: Date of Service: [name] RNER, [place] 03/15/2021 8:30 A M Medical Record Number: [id number] Patient Account Number: [id number] Date of Birth/Sex: Treating RN: 1945-09-19 (76 y.o. F) Primary Care Provider: Inda Coke Other Clinician: Referring Provider: Treating Provider/Extender: Alcario Drought Weeks in Treatment: 5 Subjective Chief Complaint Information obtained from Patient 02/08/2021; patient is here for review of a wound on the right lateral lower leg which was initially traumatic History of Present Illness (HPI) ADMISSION 02/08/2021 This is a 76 year old woman who suffered a fall in early December while going down stairs.. She was seen in the ER. She was given Rocephin and Keflex. She was also followed up in the ER several days later. A DVT rule out was negative. On 1/9 she started following up with her primary doctor. Notable for the fact that she was having some bleeding. She was continued on Keflex on 1/12 using an Ace wrap. She was referred to our clinic for review. She is not on any anticoagulants although she is on aspirin. She has a history of vein ligation in 2005 when she was in [place]. She tells me she did wear compression socks the first week that she had this done and more recently has been using Ace wraps. Past medical history includes congestive heart failure, psoriasis, arthritis, right total knee replacement, vein stripping as noted. She also has macular degeneration. The wound is on her right lateral lower extremity. Her ABIs were 1.1 in our clinic 2/1; patient was admitted the clinic last week she had a contusion on the right lateral leg. It had fully evacuated for the most part when she  came here. No real problem with this is not just the physical depth but the undermining depth superiorly. Culture I did of this last week is still pending but obviously did not grow the standard worrisome organisms including staph strep and the common gram-negative's. I did not give her antibiotics. We have been using silver alginate. 2/8; patient's wound tunneling superiorly on the right leg 4.5 cm which is an improvement. Culture I did last week was negative so showing only diphtheroids. She does not seem to need any additional systemic antibiotics. At the suggestion of our intake nurse I think the best [facility] choice here would be a snap VAC under compression 2/15;  no improvement in the superior tunnel now 1 week with a snap VAC under compression. Our intake nurse points out an additional tunnel at 9:00 which I did not identify last time. The surface of the wound looks better. There is some drainage and she is complaining of pain 2/22; the tunnel superiorly is now down 1 cm but not the tunnel at 9:00 area this is a probing wound going superiorly. There is no superficial aspect of this. We are using a snap back under compression. She is tolerating this well Culture I did last week showed Staphylococcus Caprae which is coag negative. I don't think this had any relevance as it was only "rare" 03/15/2021: The tunneling continues to improve. Overall, the wound dimensions are roughly the same. Based upon the coag negative staph culture, Dr. Leanord Hawking added mupirocin. She continues in a snap VAC with 3 layer compression. Patient History Information obtained from Patient. Social History Never smoker, Marital Status - Married, Alcohol Use - Never, Drug Use - No History, Caffeine Use - Rarely - coffee. Medical History Eyes Denies history of Cataracts, Glaucoma, Optic Neuritis Ear/Nose/Mouth/Throat Denies history of Chronic sinus problems/congestion, Middle ear problems Hematologic/Lymphatic Denies history of  Anemia, Hemophilia, Human Immunodeficiency Virus, Lymphedema, Sickle Cell Disease Musculoskeletal Patient has history of Osteoarthritis Hospitalization/Surgery History - goiter. - IR kypho thoracic with bone biopsy. - joint replacement. - knee replacement. - total hip arthroplasty. - vein ligation and stripping. - cataract surgery. Medical A Surgical History Notes nd Integumentary (Skin) psoriasis Objective Constitutional No acute distress. Vitals Time Taken: 8:51 AM, Height: 68 in, Weight: 200 lbs, BMI: 30.4, Temperature: 98.2 F, Pulse: 78 bpm, Respiratory Rate: 17 breaths/min, Blood Pressure: 122/69 mmHg. Respiratory Normal work of breathing on room air. General Notes: 03/15/2021: Wound examoothe tunneling has improved somewhat. No erythema, or induration. Slight serous drainage, no purulence. Integumentary (Hair, Skin) Wound #1 status is Open. Original cause of wound was Trauma. The date acquired was: 12/15/2020. The wound has been in treatment 5 weeks. The wound is located on the Right,Lateral Lower Leg. The wound measures 1cm length x 0.4cm width x 0.8cm depth; 0.314cm^2 area and 0.251cm^3 volume. There is Fat Layer (Subcutaneous Tissue) exposed. Tunneling has been noted at 12:00 with a maximum distance of 3.2cm. Undermining begins at 10:00 and ends at 4:00 with a maximum distance of 2.3cm. There is a medium amount of serosanguineous drainage noted. The wound margin is distinct with the outline attached to the wound base. There is large (67-100%) red granulation within the wound bed. There is no necrotic tissue within the wound bed. Assessment Active Problems ICD-10 Contusion of right lower leg, subsequent encounter Non-pressure chronic ulcer of other part of right lower leg with other specified severity Chronic venous hypertension (idiopathic) without complications of right lower extremity Procedures Wound #1 Pre-procedure diagnosis of Wound #1 is a Venous Leg Ulcer located on  the Right,Lateral Lower Leg .Severity of Tissue Pre Debridement is: Fat layer exposed. There was a Selective/Open Wound Non-Viable Tissue Debridement with a total area of 0.4 sq cm performed by Duanne Guess, MD. With the following instrument(s): Curette to remove Non-Viable tissue/material. Material removed includes [facility]. No specimens were taken. A time out was conducted at 09:14, prior to the start of the procedure. A Minimum amount of bleeding was controlled with Pressure. The procedure was tolerated well with a pain level of 0 throughout and a pain level of 0 following the procedure. Post Debridement Measurements: 1cm length x 0.4cm width x 0.8cm depth; 0.251cm^3  volume. Character of Wound/Ulcer Post Debridement is improved. Severity of Tissue Post Debridement is: Fat layer exposed. Post procedure Diagnosis Wound #1: Same as Pre-Procedure Pre-procedure diagnosis of Wound #1 is a Venous Leg Ulcer located on the Right,Lateral Lower Leg . There was a Three Layer Compression Therapy Procedure by Levan Hurst, RN. Post procedure Diagnosis Wound #1: Same as Pre-Procedure Plan Follow-up Appointments: Return Appointment in 1 week. - Dr Celine Ahr Bathing/ Shower/ Hygiene: May shower with protection but do not get wound dressing(s) wet. Negative Presssure Wound Therapy: Wound #1 Right,Lateral Lower Leg: SNAP Vac to wound continuously at 18mm/hg pressure Blue Foam Edema Control - Lymphedema / SCD / Other: Elevate legs to the level of the heart or above for 30 minutes daily and/or when sitting, a frequency of: - throughout the day Avoid standing for long periods of time. - Elevate legs throughout the day Exercise regularly WOUND #1: - Lower Leg Wound Laterality: Right, Lateral Cleanser: Soap and Water 1 x Per Week/ Discharge Instructions: cover right leg. May shower. Peri-Wound Care: Sween Lotion (Moisturizing lotion) 1 x Per Week/ Discharge Instructions: Apply moisturizing lotion as  directed Topical: Mupirocin Ointment 1 x Per Week/ Discharge Instructions: Apply Mupirocin (Bactroban) as instructed Prim Dressing: SNAP Therapy System Dressing, 6x6 (in/in) 1 x Per Week/ ary Com pression Wrap: ThreePress (3 layer compression wrap) 1 x Per Week/ Discharge Instructions: Apply three layer compression as directed. 03/15/2021: I debrided a small amount of lightly adherent slough from the wound bed. We will continue with mupirocin topically with the snap wound VAC. Continue 3 layer compression. I will see her in 1 week. Electronic Signature(s) Signed: 03/15/2021 9:29:10 AM By: Fredirick Maudlin MD FACS Entered By: Fredirick Maudlin on 03/15/2021 09:29:09 -------------------------------------------------------------------------------- HxROS Details Patient Name: Date of Service: [name] RNER, Kemisha 03/15/2021 8:30 A M Medical Record Number: [id number] Patient Account Number: [id number] Date of Birth/Sex: Treating RN: May 11, 1945 (76 y.o. F) Primary Care Provider: Inda Coke Other Clinician: Referring Provider: Treating Provider/Extender: Alcario Drought Weeks in Treatment: 5 Information Obtained From Patient Eyes Medical History: Negative for: Cataracts; Glaucoma; Optic Neuritis Ear/Nose/Mouth/Throat Medical History: Negative for: Chronic sinus problems/congestion; Middle ear problems Hematologic/Lymphatic Medical History: Negative for: Anemia; Hemophilia; Human Immunodeficiency Virus; Lymphedema; Sickle Cell Disease Integumentary (Skin) Medical History: Past Medical History Notes: psoriasis Musculoskeletal Medical History: Positive for: Osteoarthritis Immunizations Pneumococcal Vaccine: Received Pneumococcal Vaccination: Yes Received Pneumococcal Vaccination On or After 60th Birthday: Yes Implantable Devices No devices added Hospitalization / Surgery History Type of Hospitalization/Surgery goiter IR kypho thoracic with bone biopsy joint  replacement knee replacement total hip arthroplasty vein ligation and stripping cataract surgery Family and Social History Never smoker; Marital Status - Married; Alcohol Use: Never; Drug Use: No History; Caffeine Use: Rarely - coffee; Financial Concerns: No; Food, Clothing or Shelter Needs: No; Support System Lacking: No; Transportation Concerns: No Physician Affirmation I have reviewed and agree with the above information. Electronic Signature(s) Signed: 03/15/2021 9:29:38 AM By: Fredirick Maudlin MD FACS Entered By: Fredirick Maudlin on 03/15/2021 09:26:22 -------------------------------------------------------------------------------- SuperBill Details Patient Name: Date of Service: [name] RNER, Karla 03/15/2021 Medical Record Number: [id number] Patient Account Number: [id number] Date of Birth/Sex: Treating RN: Nov 06, 1945 (76 y.o. F) Primary Care Provider: Inda Coke Other Clinician: Referring Provider: Treating Provider/Extender: Alcario Drought Weeks in Treatment: 5 Diagnosis Coding ICD-10 Codes Code Description S80.11XD Contusion of right lower leg, subsequent encounter L97.818 Non-pressure chronic ulcer of other part of right lower leg with other specified severity I87.301 Chronic venous hypertension (idiopathic) without complications  of right lower extremity Facility Procedures CPT4 Code: [id number] Description: [phone] - DEBRIDE WOUND 1ST 20 SQ CM OR < ICD-10 Diagnosis Description L97.818 Non-pressure chronic ulcer of other part of right lower leg with other specified s Modifier: everity Quantity: 1 Physician Procedures : CPT4 Code Description Modifier [id number] - WC PHYS DEBR WO ANESTH 20 SQ CM ICD-10 Diagnosis Description L97.818 Non-pressure chronic ulcer of other part of right lower leg with other specified severity Quantity: 1 Electronic Signature(s) Signed: 03/15/2021 9:29:21 AM By: Fredirick Maudlin MD FACS Entered By: Fredirick Maudlin on  03/15/2021 [id number]

## 2021-03-16 NOTE — Progress Notes (Signed)
[name] ([id number]) Visit Report for 03/15/2021 Arrival Information Details Patient Name: Date of Service: [name], [name] 03/15/2021 8:30 A M Medical Record Number: [id number] Patient Account Number: [id number] Date of Birth/Sex: Treating RN: April 30, 1945 (76 y.o. F) Primary Care Zachory Mangual: Inda Coke Other Clinician: Referring Coen Miyasato: Treating Gladys Gutman/Extender: Alcario Drought Weeks in Treatment: 5 Visit Information History Since Last Visit Added or deleted any medications: No Patient Arrived: Ambulatory Any new allergies or adverse reactions: No Arrival Time: 08:50 Had a fall or experienced change in No Accompanied By: self activities of daily living that may affect Transfer Assistance: None risk of falls: Patient Identification Verified: Yes Signs or symptoms of abuse/neglect since last visito No Secondary Verification Process Completed: Yes Hospitalized since last visit: No Patient Requires Transmission-Based Precautions: No Implantable device outside of the clinic excluding No Patient Has Alerts: No cellular tissue based products placed in the center since last visit: Has Dressing in Place as Prescribed: Yes Pain Present Now: No Electronic Signature(s) Signed: 03/16/2021 9:21:53 AM By: Sandre Kitty Entered By: Sandre Kitty on 03/15/2021 08:51:26 -------------------------------------------------------------------------------- Compression Therapy Details Patient Name: Date of Service: [name] 03/15/2021 8:30 A M Medical Record Number: [id number] Patient Account Number: [id number] Date of Birth/Sex: Treating RN: October 23, 1945 (76 y.o. Nancy Fetter Primary Care Tyde Lamison: Inda Coke Other Clinician: Referring Sriansh Farra: Treating Quin Mathenia/Extender: Alcario Drought Weeks in Treatment: 5 Compression Therapy Performed for Wound Assessment: Wound #1 Right,Lateral Lower Leg Performed By: Clinician Levan Hurst,  RN Compression Type: Three Layer Post Procedure Diagnosis Same as Pre-procedure Electronic Signature(s) Signed: 03/15/2021 5:58:06 PM By: Levan Hurst RN, BSN Entered By: Levan Hurst on 03/15/2021 09:17:35 -------------------------------------------------------------------------------- Encounter Discharge Information Details Patient Name: Date of Service: [name] 03/15/2021 8:30 A M Medical Record Number: [id number] Patient Account Number: [id number] Date of Birth/Sex: Treating RN: 1946/01/04 (76 y.o. Nancy Fetter Primary Care Mana Morison: Inda Coke Other Clinician: Referring Flavius Repsher: Treating [place] Gustafson/Extender: Alcario Drought Weeks in Treatment: 5 Encounter Discharge Information Items Post Procedure Vitals Discharge Condition: Stable Temperature (F): 98.2 Ambulatory Status: Ambulatory Pulse (bpm): 78 Discharge Destination: Home Respiratory Rate (breaths/min): 17 Transportation: Private Auto Blood Pressure (mmHg): 122/69 Accompanied By: alone Schedule Follow-up Appointment: Yes Clinical Summary of Care: Patient Declined Electronic Signature(s) Signed: 03/15/2021 5:58:06 PM By: Levan Hurst RN, BSN Entered By: Levan Hurst on 03/15/2021 12:09:48 -------------------------------------------------------------------------------- Lower Extremity Assessment Details Patient Name: Date of Service: [name] 03/15/2021 8:30 A M Medical Record Number: [id number] Patient Account Number: [id number] Date of Birth/Sex: Treating RN: [birth year] (76 y.o. Nancy Fetter Primary Care Merik Mignano: Inda Coke Other Clinician: Referring Ebb Carelock: Treating Mahiya Kercheval/Extender: Alcario Drought Weeks in Treatment: 5 Edema Assessment Assessed: [Left: No] [Right: No] Edema: [Left: Ye] [Right: s] Calf Left: Right: Point of Measurement: 33 cm From Medial Instep 37 cm Ankle Left: Right: Point of Measurement: 7 cm From Medial  Instep 23 cm Vascular Assessment Pulses: Dorsalis Pedis Palpable: [Right:Yes] Electronic Signature(s) Signed: 03/15/2021 5:58:06 PM By: Levan Hurst RN, BSN Entered By: Levan Hurst on 03/15/2021 09:09:13 -------------------------------------------------------------------------------- Multi Wound Chart Details Patient Name: Date of Service: [name] 03/15/2021 8:30 A M Medical Record Number: [id number] Patient Account Number: [id number] Date of Birth/Sex: Treating RN: 11-21-1945 (76 y.o. F) Primary Care Monique Gift: Inda Coke Other Clinician: Referring Jamoni Hewes: Treating Maisley Hainsworth/Extender: Alcario Drought Weeks in Treatment: 5 Vital Signs Height(in): 68 Pulse(bpm): 78 Weight(lbs): 200 Blood Pressure(mmHg): 122/69 Body Mass Index(BMI): 30.4 Temperature(F): 98.2 Respiratory Rate(breaths/min): 17 Photos: [N/A:N/A] Right, Lateral  Lower Leg N/A N/A Wound Location: Trauma N/A N/A Wounding Event: Venous Leg Ulcer N/A N/A Primary Etiology: Osteoarthritis N/A N/A Comorbid History: 12/15/2020 N/A N/A Date Acquired: 5 N/A N/A Weeks of Treatment: Open N/A N/A Wound Status: No N/A N/A Wound Recurrence: 1x0.4x0.8 N/A N/A Measurements L x W x D (cm) 0.314 N/A N/A A (cm) : rea 0.251 N/A N/A Volume (cm) : 84.00% N/A N/A % Reduction in A rea: 93.60% N/A N/A % Reduction in Volume: 12 Position 1 (o'clock): 3.2 Maximum Distance 1 (cm): 10 Starting Position 1 (o'clock): 4 Ending Position 1 (o'clock): 2.3 Maximum Distance 1 (cm): Yes N/A N/A Tunneling: Yes N/A N/A Undermining: Full Thickness With Exposed Support N/A N/A Classification: Structures Medium N/A N/A Exudate A mount: Serosanguineous N/A N/A Exudate Type: red, brown N/A N/A Exudate Color: Distinct, outline attached N/A N/A Wound Margin: Large (67-100%) N/A N/A Granulation A mount: Red N/A N/A Granulation Quality: None Present (0%) N/A N/A Necrotic A mount: Fat  Layer (Subcutaneous Tissue): Yes N/A N/A Exposed Structures: Fascia: No Tendon: No Muscle: No Joint: No Bone: No None N/A N/A Epithelialization: Debridement - Selective/Open Wound N/A N/A Debridement: Pre-procedure Verification/Time Out 09:14 N/A N/A Taken: Slough N/A N/A Tissue Debrided: Non-Viable Tissue N/A N/A Level: 0.4 N/A N/A Debridement A (sq cm): rea Curette N/A N/A Instrument: Minimum N/A N/A Bleeding: Pressure N/A N/A Hemostasis A chieved: 0 N/A N/A Procedural Pain: 0 N/A N/A Post Procedural Pain: Procedure was tolerated well N/A N/A Debridement Treatment Response: 1x0.4x0.8 N/A N/A Post Debridement Measurements L x W x D (cm) 0.251 N/A N/A Post Debridement Volume: (cm) Compression Therapy N/A N/A Procedures Performed: Debridement Treatment Notes Electronic Signature(s) Signed: 03/15/2021 9:24:08 AM By: Fredirick Maudlin MD FACS Entered By: Fredirick Maudlin on 03/15/2021 09:24:08 -------------------------------------------------------------------------------- Multi-Disciplinary Care Plan Details Patient Name: Date of Service: [name] RNER, Shanea 03/15/2021 8:30 A M Medical Record Number: [id number] Patient Account Number: [id number] Date of Birth/Sex: Treating RN: 21-Oct-1945 (76 y.o. Nancy Fetter Primary Care Natallia Stellmach: Inda Coke Other Clinician: Referring Khristin Keleher: Treating Tamari Busic/Extender: Alcario Drought Weeks in Treatment: 5 Multidisciplinary Care Plan reviewed with physician Active Inactive Venous Leg Ulcer Nursing Diagnoses: Knowledge deficit related to disease process and management Potential for venous Insuffiency (use before diagnosis confirmed) Goals: Patient will maintain optimal edema control Date Initiated: 02/22/2021 Target Resolution Date: 04/14/2021 Goal Status: Active Interventions: Assess peripheral edema status every visit. Compression as ordered Provide education on venous insufficiency Treatment  Activities: Therapeutic compression applied : 02/22/2021 Notes: Wound/Skin Impairment Nursing Diagnoses: Impaired tissue integrity Goals: Patient/caregiver will verbalize understanding of skin care regimen Date Initiated: 02/08/2021 Target Resolution Date: 04/14/2021 Goal Status: Active Interventions: Assess ulceration(s) every visit Notes: Electronic Signature(s) Signed: 03/15/2021 5:58:06 PM By: Levan Hurst RN, BSN Entered By: Levan Hurst on 03/15/2021 12:04:57 -------------------------------------------------------------------------------- Negative Pressure Wound Therapy Maintenance (NPWT) Details Patient Name: Date of Service: CROSBY, GACHUPIN 03/15/2021 8:30 A M Medical Record Number: [id number] Patient Account Number: [id number] Date of Birth/Sex: Treating RN: [birth year] (76 y.o. Nancy Fetter Primary Care Dewel Lotter: Inda Coke Other Clinician: Referring Shiniqua Groseclose: Treating Jaylyn Iyer/Extender: Alcario Drought Weeks in Treatment: 5 NPWT Maintenance Performed for: Wound #1 Right, Lateral Lower Leg Performed By: Levan Hurst, RN Type: Other Coverage Size (sq cm): 0.4 Pressure Type: Constant Pressure Setting: 125 mmHG Drain Type: Sponge/Dressing Type: Foam, Blue Date Initiated: 02/22/2021 Dressing Removed: Yes Quantity of Sponges/Gauze Removed: 1 piece blue foam Canister Changed: Yes Canister Exudate Volume: 50 Dressing Reapplied: Yes Quantity of Sponges/Gauze Inserted: 1  piece blue foam Respones T Treatment: o pt tolerated well Days On NPWT : 22 Post Procedure Diagnosis Same as Pre-procedure Electronic Signature(s) Signed: 03/15/2021 5:58:06 PM By: Levan Hurst RN, BSN Entered By: Levan Hurst on 03/15/2021 09:38:01 -------------------------------------------------------------------------------- Pain Assessment Details Patient Name: Date of Service: [name] RNER, Marilla 03/15/2021 8:30 A M Medical Record Number: [id number] Patient Account  Number: [id number] Date of Birth/Sex: Treating RN: Apr 13, 1945 (76 y.o. F) Primary Care Lamaria Hildebrandt: Inda Coke Other Clinician: Referring Maelys Kinnick: Treating Kiannah Grunow/Extender: Alcario Drought Weeks in Treatment: 5 Active Problems Location of Pain Severity and Description of Pain Patient Has Paino No Site Locations Pain Management and Medication Current Pain Management: Electronic Signature(s) Signed: 03/16/2021 9:21:53 AM By: Sandre Kitty Entered By: Sandre Kitty on 03/15/2021 08:52:00 -------------------------------------------------------------------------------- Patient/Caregiver Education Details Patient Name: Date of Service: [name] Chiquito, Colette 3/1/2023andnbsp8:30 [place] Record Number: [id number] Patient Account Number: [id number] Date of Birth/Gender: Treating RN: 1945-06-09 (76 y.o. Nancy Fetter Primary Care Physician: Inda Coke Other Clinician: Referring Physician: Treating Physician/Extender: Fausto Skillern in Treatment: 5 Education Assessment Education Provided To: Patient Education Topics Provided Wound/Skin Impairment: Methods: Explain/Verbal Responses: State content correctly Electronic Signature(s) Signed: 03/15/2021 5:58:06 PM By: Levan Hurst RN, BSN Entered By: Levan Hurst on 03/15/2021 12:05:08 -------------------------------------------------------------------------------- Wound Assessment Details Patient Name: Date of Service: [name] RNER, Remmi 03/15/2021 8:30 A M Medical Record Number: [id number] Patient Account Number: [id number] Date of Birth/Sex: Treating RN: [birth year] (76 y.o. America Brown Primary Care [place] Sandoval: Inda Coke Other Clinician: Referring Shamar Kracke: Treating Kariss Longmire/Extender: Alcario Drought Weeks in Treatment: 5 Wound Status Wound Number: 1 Primary Etiology: Venous Leg Ulcer Wound Location: Right, Lateral Lower Leg Wound Status:  Open Wounding Event: Trauma Comorbid History: Osteoarthritis Date Acquired: 12/15/2020 Weeks Of Treatment: 5 Clustered Wound: No Photos Wound Measurements Length: (cm) 1 Width: (cm) 0.4 Depth: (cm) 0.8 Area: (cm) 0.314 Volume: (cm) 0.251 % Reduction in Area: 84% % Reduction in Volume: 93.6% Epithelialization: None Tunneling: Yes Position (o'clock): 12 Maximum Distance: (cm) 3.2 Undermining: Yes Starting Position (o'clock): 10 Ending Position (o'clock): 4 Maximum Distance: (cm) 2.3 Wound Description Classification: Full Thickness With Exposed Support Struct Wound Margin: Distinct, outline attached Exudate Amount: Medium Exudate Type: Serosanguineous Exudate Color: red, brown ures Foul Odor After Cleansing: No Slough/Fibrino Yes Wound Bed Granulation Amount: Large (67-100%) Exposed Structure Granulation Quality: Red Fascia Exposed: No Necrotic Amount: None Present (0%) Fat Layer (Subcutaneous Tissue) Exposed: Yes Tendon Exposed: No Muscle Exposed: No Joint Exposed: No Bone Exposed: No Treatment Notes Wound #1 (Lower Leg) Wound Laterality: Right, Lateral Cleanser Soap and Water Discharge Instruction: cover right leg. May shower. Peri-Wound Care Sween Lotion (Moisturizing lotion) Discharge Instruction: Apply moisturizing lotion as directed Topical Mupirocin Ointment Discharge Instruction: Apply Mupirocin (Bactroban) as instructed Primary Dressing SNAP Therapy System Dressing, 6x6 (in/in) Secondary Dressing Secured With Compression Wrap ThreePress (3 layer compression wrap) Discharge Instruction: Apply three layer compression as directed. Compression Stockings Add-Ons Electronic Signature(s) Signed: 03/15/2021 4:14:19 PM By: Dellie Catholic RN Signed: 03/15/2021 5:58:06 PM By: Levan Hurst RN, BSN Entered By: Levan Hurst on 03/15/2021 09:08:17 -------------------------------------------------------------------------------- [place] Details Patient  Name: Date of Service: [name] RNER, Jenette 03/15/2021 8:30 A M Medical Record Number: [id number] Patient Account Number: [id number] Date of Birth/Sex: Treating RN: Jun 16, 1945 (76 y.o. F) Primary Care Hodan Wurtz: Inda Coke Other Clinician: Referring Anandi Abramo: Treating Zuha Dejonge/Extender: Alcario Drought Weeks in Treatment: 5 Vital Signs Time Taken: 08:51 Temperature (F): 98.2 Height (in): 68 Pulse (bpm): 78 Weight (lbs): 200  Respiratory Rate (breaths/min): 17 Body Mass Index (BMI): 30.4 Blood Pressure (mmHg): 122/69 Reference Range: 80 - 120 mg / dl Electronic Signature(s) Signed: 03/16/2021 9:21:53 AM By: Sandre Kitty Entered By: Sandre Kitty on 03/15/2021 08:51:51

## 2021-03-22 ENCOUNTER — Encounter (HOSPITAL_BASED_OUTPATIENT_CLINIC_OR_DEPARTMENT_OTHER): Payer: Medicare Other | Admitting: General Surgery

## 2021-03-22 ENCOUNTER — Other Ambulatory Visit: Payer: Self-pay

## 2021-03-22 DIAGNOSIS — S8011XA Contusion of right lower leg, initial encounter: Secondary | ICD-10-CM | POA: Diagnosis not present

## 2021-03-22 NOTE — Progress Notes (Addendum)
[name], [name] ([id number]) Visit Report for 03/22/2021 Chief Complaint Document Details Patient Name: Date of Service: [name], [name] 03/22/2021 8:15 A M Medical Record Number: [id number] Patient Account Number: [id number] Date of Birth/Sex: Treating RN: [birth year] (76 y.o. F) Primary Care Provider: Jarold [name] Other Clinician: Referring Provider: Treating Provider/Extender: [name] in Treatment: 6 Information Obtained from: Patient Chief Complaint 02/08/2021; patient is here for review of a wound on the right lateral lower leg which was initially traumatic Electronic Signature(s) Signed: 03/22/2021 9:23:57 AM By: [name] Guess MD FACS Entered By: [name] on 03/22/2021 09:23:57 -------------------------------------------------------------------------------- HPI Details Patient Name: Date of Service: [name], [name] 03/22/2021 8:15 A M Medical Record Number: [id number] Patient Account Number: [id number] Date of Birth/Sex: Treating RN: 12-Nov-1945 (76 y.o. F) Primary Care Provider: Jarold [name] Other Clinician: Referring Provider: Treating Provider/Extender: [name] Weeks in Treatment: 6 History of Present Illness HPI Description: ADMISSION 02/08/2021 This is a 76 year old woman who suffered a fall in early December while going down stairs.. She was seen in the ER. She was given Rocephin and Keflex. She was also followed up in the ER several days later. A DVT rule out was negative. On 1/9 she started following up with her primary doctor. Notable for the fact that she was having some bleeding. She was continued on Keflex on 1/12 using an Ace wrap. She was referred to our clinic for review. She is not on any anticoagulants although she is on aspirin. She has a history of vein ligation in 2005 when she was in [place]. She tells me she did wear compression socks the first week that she had this done and more recently has  been using Ace wraps. Past medical history includes congestive heart failure, psoriasis, arthritis, right total knee replacement, vein stripping as noted. She also has macular degeneration. The wound is on her right lateral lower extremity. Her ABIs were 1.1 in our clinic 2/1; patient was admitted the clinic last week she had a contusion on the right lateral leg. It had fully evacuated for the most part when she came here. No real problem with this is not just the physical depth but the undermining depth superiorly. Culture I did of this last week is still pending but obviously did not grow the standard worrisome organisms including staph strep and the common gram-negative's. I did not give her antibiotics. We have been using silver alginate. 2/8; patient's wound tunneling superiorly on the right leg 4.5 cm which is an improvement. Culture I did last week was negative so showing only diphtheroids. She does not seem to need any additional systemic antibiotics. At the suggestion of our intake nurse I think the best [facility] choice here would be a snap VAC under compression 2/15; no improvement in the superior tunnel now 1 week with a snap VAC under compression. Our intake nurse points out an additional tunnel at 9:00 which I did not identify last time. The surface of the wound looks better. There is some drainage and she is complaining of pain 2/22; the tunnel superiorly is now down 1 cm but not the tunnel at 9:00 area this is a probing wound going superiorly. There is no superficial aspect of this. We are using a snap back under compression. She is tolerating this well Culture I did last week showed Staphylococcus Caprae which is coag negative. I don't think this had any relevance as it was only "rare" 03/15/2021: The tunneling continues to improve. Overall, the wound dimensions  are roughly the same. Based upon the coag negative staph culture, [name]. Leanord [name] added mupirocin. She continues in a snap VAC with 3  layer compression. 03/22/2021: Continued improvement in the depth of the tunneling. Last night, she says that the snap cartridge was full and she experienced some pain. Overall wound dimensions appear about the same otherwise. Electronic Signature(s) Signed: 03/22/2021 9:25:16 AM By: [name] Guess MD FACS Entered By: [name] on 03/22/2021 09:25:16 -------------------------------------------------------------------------------- Physical Exam Details Patient Name: Date of Service: [name], [name] 03/22/2021 8:15 A M Medical Record Number: [id number] Patient Account Number: [id number] Date of Birth/Sex: Treating RN: [birth year] (76 y.o. F) Primary Care Provider: Jarold [name] Other Clinician: Referring Provider: Treating Provider/Extender: [name] Weeks in Treatment: 6 Constitutional . . . . No acute distress. Respiratory Normal work of breathing on room air. Notes 03/22/2021: There has been a continued decrease in the depth of the tunneling. No erythema induration or purulence appreciated. Minimal serous drainage. Electronic Signature(s) Signed: 03/22/2021 9:33:35 AM By: [name] Guess MD FACS Entered By: [name] on 03/22/2021 09:33:35 -------------------------------------------------------------------------------- Physician Orders Details Patient Name: Date of Service: [name], [name] 03/22/2021 8:15 A M Medical Record Number: [id number] Patient Account Number: [id number] Date of Birth/Sex: Treating RN: October 01, 1945 (75 y.o. [name] Primary Care Provider: Jarold [name] Other Clinician: Referring Provider: Treating Provider/Extender: [name] Weeks in Treatment: 6 Verbal / Phone Orders: No Diagnosis Coding ICD-10 Coding Code Description S80.11XD Contusion of right lower leg, subsequent encounter L97.818 Non-pressure chronic ulcer of other part of right lower leg with other specified severity I87.301 Chronic  venous hypertension (idiopathic) without complications of right lower extremity Follow-up Appointments ppointment in 1 week. - [name] Return A Bathing/ Shower/ Hygiene May shower with protection but do not get wound dressing(s) wet. Negative Presssure Wound Therapy Wound #1 Right,Lateral Lower Leg SNAP Vac to wound continuously at 187mm/hg pressure Blue Foam Edema Control - Lymphedema / SCD / Other Right Lower Extremity Elevate legs to the level of the heart or above for 30 minutes daily and/or when sitting, a frequency of: - throughout the day void standing for long periods of time. - Elevate legs throughout the day A Exercise regularly Wound Treatment Wound #1 - Lower Leg Wound Laterality: Right, Lateral Cleanser: Soap and Water 1 x Per Week Discharge Instructions: cover right leg. May shower. Peri-Wound Care: Sween Lotion (Moisturizing lotion) 1 x Per Week Discharge Instructions: Apply moisturizing lotion as directed Topical: Mupirocin Ointment 1 x Per Week Discharge Instructions: Apply Mupirocin (Bactroban) as instructed Prim Dressing: SNAP Therapy System Dressing, 6x6 (in/in) ary 1 x Per Week Compression Wrap: ThreePress (3 layer compression wrap) 1 x Per Week Discharge Instructions: Apply three layer compression as directed. Electronic Signature(s) Signed: 03/22/2021 12:45:07 PM By: [name] Guess MD FACS Entered By: [name] on 03/22/2021 09:33:59 -------------------------------------------------------------------------------- Problem List Details Patient Name: Date of Service: [name], [name] 03/22/2021 8:15 A M Medical Record Number: [id number] Patient Account Number: [id number] Date of Birth/Sex: Treating RN: 11-22-1945 (76 y.o. F) Primary Care Provider: Jarold [name] Other Clinician: Referring Provider: Treating Provider/Extender: [name] Weeks in Treatment: 6 Active Problems ICD-10 Encounter Code Description Active Date  MDM Diagnosis S80.11XD Contusion of right lower leg, subsequent encounter 02/08/2021 No Yes L97.818 Non-pressure chronic ulcer of other part of right lower leg with other specified 02/08/2021 No Yes severity I87.301 Chronic venous hypertension (idiopathic) without complications of right lower 02/08/2021 No Yes extremity Inactive Problems Resolved Problems Electronic Signature(s) Signed:  03/22/2021 9:23:28 AM By: [name] Guess MD FACS Signed: 03/22/2021 9:23:28 AM By: [name] Guess MD FACS Entered By: [name] on 03/22/2021 32:44:01 -------------------------------------------------------------------------------- Progress Note Details Patient Name: Date of Service: [name], [name] 03/22/2021 8:15 A M Medical Record Number: [id number] Patient Account Number: [id number] Date of Birth/Sex: Treating RN: [birth year] (76 y.o. F) Primary Care Provider: Jarold [name] Other Clinician: Referring Provider: Treating Provider/Extender: [name] Weeks in Treatment: 6 Subjective Chief Complaint Information obtained from Patient 02/08/2021; patient is here for review of a wound on the right lateral lower leg which was initially traumatic History of Present Illness (HPI) ADMISSION 02/08/2021 This is a 76 year old woman who suffered a fall in early December while going down stairs.. She was seen in the ER. She was given Rocephin and Keflex. She was also followed up in the ER several days later. A DVT rule out was negative. On 1/9 she started following up with her primary doctor. Notable for the fact that she was having some bleeding. She was continued on Keflex on 1/12 using an Ace wrap. She was referred to our clinic for review. She is not on any anticoagulants although she is on aspirin. She has a history of vein ligation in 2005 when she was in [place]. She tells me she did wear compression socks the first week that she had this done and more recently has been using  Ace wraps. Past medical history includes congestive heart failure, psoriasis, arthritis, right total knee replacement, vein stripping as noted. She also has macular degeneration. The wound is on her right lateral lower extremity. Her ABIs were 1.1 in our clinic 2/1; patient was admitted the clinic last week she had a contusion on the right lateral leg. It had fully evacuated for the most part when she came here. No real problem with this is not just the physical depth but the undermining depth superiorly. Culture I did of this last week is still pending but obviously did not grow the standard worrisome organisms including staph strep and the common gram-negative's. I did not give her antibiotics. We have been using silver alginate. 2/8; patient's wound tunneling superiorly on the right leg 4.5 cm which is an improvement. Culture I did last week was negative so showing only diphtheroids. She does not seem to need any additional systemic antibiotics. At the suggestion of our intake nurse I think the best [facility] choice here would be a snap VAC under compression 2/15; no improvement in the superior tunnel now 1 week with a snap VAC under compression. Our intake nurse points out an additional tunnel at 9:00 which I did not identify last time. The surface of the wound looks better. There is some drainage and she is complaining of pain 2/22; the tunnel superiorly is now down 1 cm but not the tunnel at 9:00 area this is a probing wound going superiorly. There is no superficial aspect of this. We are using a snap back under compression. She is tolerating this well Culture I did last week showed Staphylococcus Caprae which is coag negative. I don't think this had any relevance as it was only "rare" 03/15/2021: The tunneling continues to improve. Overall, the wound dimensions are roughly the same. Based upon the coag negative staph culture, [name]. Leanord [name] added mupirocin. She continues in a snap VAC with 3 layer  compression. 03/22/2021: Continued improvement in the depth of the tunneling. Last night, she says that the snap cartridge was full and she experienced some pain. Overall wound  dimensions appear about the same otherwise. Patient History Information obtained from Patient. Social History Never smoker, Marital Status - Married, Alcohol Use - Never, Drug Use - No History, Caffeine Use - Rarely - coffee. Medical History Eyes Denies history of Cataracts, Glaucoma, Optic Neuritis Ear/Nose/Mouth/Throat Denies history of Chronic sinus problems/congestion, Middle ear problems Hematologic/Lymphatic Denies history of Anemia, Hemophilia, Human Immunodeficiency Virus, Lymphedema, Sickle Cell Disease Musculoskeletal Patient has history of Osteoarthritis Hospitalization/Surgery History - goiter. - IR kypho thoracic with bone biopsy. - joint replacement. - knee replacement. - total hip arthroplasty. - vein ligation and stripping. - cataract surgery. Medical A Surgical History Notes nd Integumentary (Skin) psoriasis Objective Constitutional No acute distress. Vitals Time Taken: 8:46 AM, Height: 68 in, Weight: 200 lbs, BMI: 30.4, Temperature: 97.9 F, Pulse: 78 bpm, Respiratory Rate: 18 breaths/min, Blood Pressure: 146/84 mmHg. Respiratory Normal work of breathing on room air. General Notes: 03/22/2021: There has been a continued decrease in the depth of the tunneling. No erythema induration or purulence appreciated. Minimal serous drainage. Integumentary (Hair, Skin) Wound #1 status is Open. Original cause of wound was Trauma. The date acquired was: 12/15/2020. The wound has been in treatment 6 weeks. The wound is located on the Right,Lateral Lower Leg. The wound measures 0.6cm length x 0.5cm width x 0.5cm depth; 0.236cm^2 area and 0.118cm^3 volume. There is Fat Layer (Subcutaneous Tissue) exposed. Tunneling has been noted at 12:00 with a maximum distance of 2.9cm. Undermining begins at 10:00 and ends at  4:00 with a maximum distance of 1.4cm. There is a medium amount of serosanguineous drainage noted. The wound margin is distinct with the outline attached to the wound base. There is large (67-100%) red granulation within the wound bed. There is no necrotic tissue within the wound bed. Assessment Active Problems ICD-10 Contusion of right lower leg, subsequent encounter Non-pressure chronic ulcer of other part of right lower leg with other specified severity Chronic venous hypertension (idiopathic) without complications of right lower extremity Plan Follow-up Appointments: Return Appointment in 1 week. - [name] Lady [place] Bathing/ Shower/ Hygiene: May shower with protection but do not get wound dressing(s) wet. Negative Presssure Wound Therapy: Wound #1 Right,Lateral Lower Leg: SNAP Vac to wound continuously at 17325mm/hg pressure Blue Foam Edema Control - Lymphedema / SCD / Other: Elevate legs to the level of the heart or above for 30 minutes daily and/or when sitting, a frequency of: - throughout the day Avoid standing for long periods of time. - Elevate legs throughout the day Exercise regularly WOUND #1: - Lower Leg Wound Laterality: Right, Lateral Cleanser: Soap and Water 1 x Per Week/ Discharge Instructions: cover right leg. May shower. Peri-Wound Care: Sween Lotion (Moisturizing lotion) 1 x Per Week/ Discharge Instructions: Apply moisturizing lotion as directed Topical: Mupirocin Ointment 1 x Per Week/ Discharge Instructions: Apply Mupirocin (Bactroban) as instructed Prim Dressing: SNAP Therapy System Dressing, 6x6 (in/in) 1 x Per Week/ ary Com pression Wrap: ThreePress (3 layer compression wrap) 1 x Per Week/ Discharge Instructions: Apply three layer compression as directed. 03/22/2021: There has been a continued decrease in the depth of the tunneling. No erythema induration or purulence appreciated. Minimal serous drainage. Continue snap VAC and 3 layer compression. The patient had some  questions about insurance coverage. We confirmed that she has Medicare which will cover the VAC. The patient also asked about increasing her activity, but I think until this wound is closed we need to optimize offloading is much as possible. Follow- up in 1 week. Electronic Signature(s) Signed: 03/22/2021  9:35:15 AM By: [name] Guess MD FACS Entered By: [name] on 03/22/2021 09:35:15 -------------------------------------------------------------------------------- HxROS Details Patient Name: Date of Service: [name], [name] 03/22/2021 8:15 A M Medical Record Number: [id number] Patient Account Number: [id number] Date of Birth/Sex: Treating RN: 1945-12-07 (76 y.o. F) Primary Care Provider: Jarold [name] Other Clinician: Referring Provider: Treating Provider/Extender: [name] Weeks in Treatment: 6 Information Obtained From Patient Eyes Medical History: Negative for: Cataracts; Glaucoma; Optic Neuritis Ear/Nose/Mouth/Throat Medical History: Negative for: Chronic sinus problems/congestion; Middle ear problems Hematologic/Lymphatic Medical History: Negative for: Anemia; Hemophilia; Human Immunodeficiency Virus; Lymphedema; Sickle Cell Disease Integumentary (Skin) Medical History: Past Medical History Notes: psoriasis Musculoskeletal Medical History: Positive for: Osteoarthritis Immunizations Pneumococcal Vaccine: Received Pneumococcal Vaccination: Yes Received Pneumococcal Vaccination On or After 60th Birthday: Yes Implantable Devices No devices added Hospitalization / Surgery History Type of Hospitalization/Surgery goiter IR kypho thoracic with bone biopsy joint replacement knee replacement total hip arthroplasty vein ligation and stripping cataract surgery Family and Social History Never smoker; Marital Status - Married; Alcohol Use: Never; Drug Use: No History; Caffeine Use: Rarely - coffee; Financial Concerns: No; Food, Clothing  or Shelter Needs: No; Support System Lacking: No; Transportation Concerns: No Physician Affirmation I have reviewed and agree with the above information. Electronic Signature(s) Signed: 03/22/2021 12:45:07 PM By: [name] Guess MD FACS Entered By: [name] on 03/22/2021 09:25:26 -------------------------------------------------------------------------------- SuperBill Details Patient Name: Date of Service: [name], [name] 03/22/2021 Medical Record Number: [id number] Patient Account Number: [id number] Date of Birth/Sex: Treating RN: 24-Aug-1945 (76 y.o. F) Primary Care Provider: Jarold [name] Other Clinician: Referring Provider: Treating Provider/Extender: [name] Weeks in Treatment: 6 Diagnosis Coding ICD-10 Codes Code Description S80.11XD Contusion of right lower leg, subsequent encounter L97.818 Non-pressure chronic ulcer of other part of right lower leg with other specified severity I87.301 Chronic venous hypertension (idiopathic) without complications of right lower extremity Facility Procedures CPT4 Code: [id number] Description: 97607 NEG PRESS WND TX <=50 SQ CM ICD-10 Diagnosis Description L97.818 Non-pressure chronic ulcer of other part of right lower leg with other specified sever Modifier: ity Quantity: 1 CPT4 Code: [id number] Description: (Facility Use Only) [phone] - APPLY MULTLAY COMPRS LWR RT LEG ICD-10 Diagnosis Description I87.301 Chronic venous hypertension (idiopathic) without complications of right lower extremit Modifier: y Quantity: 1 Physician Procedures : CPT4 Code Description Modifier [id number] - WC PHYS LEVEL 2 - EST PT ICD-10 Diagnosis Description L97.818 Non-pressure chronic ulcer of other part of right lower leg with other specified severity S80.11XD Contusion of right lower leg, subsequent  encounter Quantity: 1 : [id number] NEG PRESS WND TX <=50 SQ CM ICD-10 Diagnosis Description L97.818 Non-pressure chronic ulcer of  other part of right lower leg with other specified severity Quantity: 1 Electronic Signature(s) Signed: 03/22/2021 6:31:38 PM By: Karie Schwalbe RN Signed: 03/23/2021 6:32:54 PM By: [name] Guess MD FACS Previous Signature: 03/22/2021 12:45:07 PM Version By: [name] Guess MD FACS Previous Signature: 03/22/2021 6:27:47 PM Version By: Zandra Abts RN, BSN Previous Signature: 03/22/2021 10:07:46 AM Version By: Karie Schwalbe RN Previous Signature: 03/22/2021 9:35:34 AM Version By: [name] Guess MD FACS Entered By: Karie Schwalbe on 03/22/2021 18:31:16

## 2021-03-22 NOTE — Progress Notes (Addendum)
Atkin, Suellen ([id number]) Visit Report for 03/22/2021 Arrival Information Details Patient Name: Date of Service: [name], [name] 03/22/2021 8:15 A M Medical Record Number: [id number] Patient Account Number: [id number] Date of Birth/Sex: Treating RN: 11/03/1945 (76 y.o. [name] Primary Care Arwilda Georgia: [name] Other Clinician: Referring Murlin Schrieber: Treating Manasvini Whatley/Extender: Gwyndolyn Kaufman Weeks in Treatment: 6 Visit Information History Since Last Visit Added or deleted any medications: No Patient Arrived: Ambulatory Any new allergies or adverse reactions: No Arrival Time: 08:45 Had a fall or experienced change in No Accompanied By: self activities of daily living that may affect Transfer Assistance: None risk of falls: Patient Requires Transmission-Based Precautions: No Signs or symptoms of abuse/neglect since last visito No Patient Has Alerts: No Hospitalized since last visit: No Implantable device outside of the clinic excluding No cellular tissue based products placed in the center since last visit: Has Dressing in Place as Prescribed: Yes Has Compression in Place as Prescribed: Yes Pain Present Now: Yes Electronic Signature(s) Signed: 03/22/2021 10:07:46 AM By: Karie Schwalbe RN Entered By: Karie Schwalbe on 03/22/2021 08:46:25 -------------------------------------------------------------------------------- Compression Therapy Details Patient Name: Date of Service: [name], [name] 03/22/2021 8:15 A M Medical Record Number: [id number] Patient Account Number: [id number] Date of Birth/Sex: Treating RN: August 22, 1945 (76 y.o. [name] Primary Care Jurell Basista: [name] Other Clinician: Referring Graceson Nichelson: Treating Fenris Cauble/Extender: Gwyndolyn Kaufman Weeks in Treatment: 6 Compression Therapy Performed for Wound Assessment: Wound #1 Right,Lateral Lower Leg Performed By: Clinician Karie Schwalbe, RN Compression Type:  Three Layer Post Procedure Diagnosis Same as Pre-procedure Electronic Signature(s) Signed: 03/22/2021 6:31:38 PM By: Karie Schwalbe RN Entered By: Karie Schwalbe on 03/22/2021 18:25:33 -------------------------------------------------------------------------------- Encounter Discharge Information Details Patient Name: Date of Service: [name], [name] 03/22/2021 8:15 A M Medical Record Number: [id number] Patient Account Number: [id number] Date of Birth/Sex: Treating RN: [birth year] (76 y.o. [name] Primary Care Shiree Altemus: [name] Other Clinician: Referring Taron Conrey: Treating Shalanda Brogden/Extender: Gwyndolyn Kaufman Weeks in Treatment: 6 Encounter Discharge Information Items Discharge Condition: Stable Ambulatory Status: Ambulatory Discharge Destination: Home Transportation: Private Auto Accompanied By: self Schedule Follow-up Appointment: Yes Clinical Summary of Care: Patient Declined Electronic Signature(s) Signed: 03/22/2021 10:07:46 AM By: Karie Schwalbe RN Entered By: Karie Schwalbe on 03/22/2021 10:00:18 -------------------------------------------------------------------------------- Lower Extremity Assessment Details Patient Name: Date of Service: [name], [name] 03/22/2021 8:15 A M Medical Record Number: [id number] Patient Account Number: [id number] Date of Birth/Sex: Treating RN: 1945-10-26 (76 y.o. [name] Primary Care Divina Neale: [name] Other Clinician: Referring Traver Meckes: Treating Angel Hobdy/Extender: Gwyndolyn Kaufman Weeks in Treatment: 6 Edema Assessment Assessed: [Left: No] [Right: No] Edema: [Left: Ye] [Right: s] Calf Left: Right: Point of Measurement: 33 cm From Medial Instep 36.7 cm Ankle Left: Right: Point of Measurement: 7 cm From Medial Instep 22.8 cm Electronic Signature(s) Signed: 03/22/2021 10:07:46 AM By: Karie Schwalbe RN Entered By: Karie Schwalbe on 03/22/2021  08:48:08 -------------------------------------------------------------------------------- Multi Wound Chart Details Patient Name: Date of Service: [name], [name] 03/22/2021 8:15 A M Medical Record Number: [id number] Patient Account Number: [id number] Date of Birth/Sex: Treating RN: 09/05/1945 (76 y.o. F) Primary Care Yen Wandell: [name] Other Clinician: Referring Jamarri Vuncannon: Treating Dechelle Attaway/Extender: Gwyndolyn Kaufman Weeks in Treatment: 6 Vital Signs Height(in): 68 Pulse(bpm): 78 Weight(lbs): 200 Blood Pressure(mmHg): 146/84 Body Mass Index(BMI): 30.4 Temperature(F): 97.9 Respiratory Rate(breaths/min): 18 Photos: [N/A:N/A] Right, Lateral Lower Leg N/A N/A Wound Location: Trauma N/A N/A Wounding Event: Venous Leg Ulcer N/A N/A Primary Etiology: Osteoarthritis N/A N/A Comorbid History: 12/15/2020 N/A N/A  Date Acquired: 6 N/A N/A Weeks of Treatment: Open N/A N/A Wound Status: No N/A N/A Wound Recurrence: 0.6x0.5x0.5 N/A N/A Measurements L x W x D (cm) 0.236 N/A N/A A (cm) : rea 0.118 N/A N/A Volume (cm) : 88.00% N/A N/A % Reduction in A rea: 97.00% N/A N/A % Reduction in Volume: 12 Position 1 (o'clock): 2.9 Maximum Distance 1 (cm): 10 Starting Position 1 (o'clock): 4 Ending Position 1 (o'clock): 1.4 Maximum Distance 1 (cm): Yes N/A N/A Tunneling: Yes N/A N/A Undermining: Full Thickness With Exposed Support N/A N/A Classification: Structures Medium N/A N/A Exudate Amount: Serosanguineous N/A N/A Exudate Type: red, brown N/A N/A Exudate Color: Distinct, outline attached N/A N/A Wound Margin: Large (67-100%) N/A N/A Granulation Amount: Red N/A N/A Granulation Quality: None Present (0%) N/A N/A Necrotic Amount: Fat Layer (Subcutaneous Tissue): Yes N/A N/A Exposed Structures: Fascia: No Tendon: No Muscle: No Joint: No Bone: No None N/A N/A Epithelialization: Treatment Notes Electronic Signature(s) Signed:  03/22/2021 9:23:49 AM By: Duanne Guessannon, Jennifer MD FACS Entered By: Duanne Guessannon, Jennifer on 03/22/2021 09:23:48 -------------------------------------------------------------------------------- Multi-Disciplinary Care Plan Details Patient Name: Date of Service: [name], [name] 03/22/2021 8:15 A M Medical Record Number: [id number] Patient Account Number: [id number] Date of Birth/Sex: Treating RN: 06/22/1945 (76 y.o. Katrinka BlazingF) Scotton, Joanne Primary Care Shamir Sedlar: Jarold MottoWorley, Samantha Other Clinician: Referring Talonda Artist: Treating Datha Kissinger/Extender: Gwyndolyn Kaufmanannon, Jennifer Worley, Samantha Weeks in Treatment: 6 Multidisciplinary Care Plan reviewed with physician Active Inactive Venous Leg Ulcer Nursing Diagnoses: Knowledge deficit related to disease process and management Potential for venous Insuffiency (use before diagnosis confirmed) Goals: Patient will maintain optimal edema control Date Initiated: 02/22/2021 Target Resolution Date: 04/14/2021 Goal Status: Active Interventions: Assess peripheral edema status every visit. Compression as ordered Provide education on venous insufficiency Treatment Activities: Therapeutic compression applied : 02/22/2021 Notes: Wound/Skin Impairment Nursing Diagnoses: Impaired tissue integrity Goals: Patient/caregiver will verbalize understanding of skin care regimen Date Initiated: 02/08/2021 Target Resolution Date: 04/14/2021 Goal Status: Active Interventions: Assess ulceration(s) every visit Notes: Electronic Signature(s) Signed: 03/22/2021 10:07:46 AM By: Karie SchwalbeScotton, Joanne RN Entered By: Karie SchwalbeScotton, Joanne on 03/22/2021 09:56:52 -------------------------------------------------------------------------------- Negative Pressure Wound Therapy Maintenance (NPWT) Details Patient Name: Date of Service: Karren CobbleGA [name], [name] 03/22/2021 8:15 A M Medical Record Number: [id number] Patient Account Number: [id number] Date of Birth/Sex: Treating RN: 08/20/1945 (76 y.o. Katrinka BlazingF) Scotton, Joanne Primary  Care Amadu Schlageter: Jarold MottoWorley, Samantha Other Clinician: Referring Gagandeep Kossman: Treating Kshawn Canal/Extender: Gwyndolyn Kaufmanannon, Jennifer Worley, Samantha Weeks in Treatment: 6 NPWT Maintenance Performed for: Wound #1 Right, Lateral Lower Leg Performed By: Karie SchwalbeScotton, Joanne, RN Type: Other Coverage Size (sq cm): 0.3 Pressure Type: Constant Pressure Setting: 125 mmHG Drain Type: Other Primary Contact: Non-Adherent Sponge/Dressing Type: Foam, Blue Date Initiated: 02/22/2021 Dressing Removed: Yes Quantity of Sponges/Gauze Removed: 1 blue Canister Changed: Yes Dressing Reapplied: Yes Quantity of Sponges/Gauze Inserted: 1 blue Respones T Treatment: o Patient tolerated well Days On NPWT : 29 Post Procedure Diagnosis Same as Pre-procedure Electronic Signature(s) Signed: 03/22/2021 10:07:46 AM By: Karie SchwalbeScotton, Joanne RN Entered By: Karie SchwalbeScotton, Joanne on 03/22/2021 09:43:53 -------------------------------------------------------------------------------- Pain Assessment Details Patient Name: Date of Service: [name] GellGA [name], [name] 03/22/2021 8:15 A M Medical Record Number: [id number] Patient Account Number: [id number] Date of Birth/Sex: Treating RN: 12/01/1945 (76 y.o. Katrinka BlazingF) Scotton, Joanne Primary Care Trig Mcbryar: Jarold MottoWorley, Samantha Other Clinician: Referring Mirabel Ahlgren: Treating Donnavan Covault/Extender: Gwyndolyn Kaufmanannon, Jennifer Worley, Samantha Weeks in Treatment: 6 Active Problems Location of Pain Severity and Description of Pain Patient Has Paino Yes Site Locations Pain Location: Pain in Ulcers With Dressing Change: No Duration of the Pain. Constant / Intermittento Constant Rate  the pain. Current Pain Level: 5 Worst Pain Level: 10 Least Pain Level: 3 Tolerable Pain Level: 3 Character of Pain Describe the Pain: Burning, Throbbing Pain Management and Medication Current Pain Management: Medication: Yes Cold Application: No Rest: Yes Massage: No Activity: No T.E.N.S.: No Heat Application: No Leg drop or elevation: No Is the  Current Pain Management Adequate: Adequate How does your wound impact your activities of daily livingo Sleep: No Bathing: No Appetite: No Relationship With Others: No Bladder Continence: No Emotions: No Bowel Continence: No Work: No Toileting: No Drive: No Dressing: No Hobbies: No Electronic Signature(s) Signed: 03/22/2021 10:07:46 AM By: Karie Schwalbe RN Entered By: Karie Schwalbe on 03/22/2021 08:47:48 -------------------------------------------------------------------------------- Patient/Caregiver Education Details Patient Name: Date of Service: [name], [name] 3/8/2023andnbsp8:15 A M Medical Record Number: [id number] Patient Account Number: [id number] Date of Birth/Gender: Treating RN: January 16, 1945 (76 y.o. [name] Primary Care Physician: [name] Other Clinician: Referring Physician: Treating Physician/Extender: Ermalene Searing in Treatment: 6 Education Assessment Education Provided To: Patient Education Topics Provided Venous: Methods: Explain/Verbal Responses: Return demonstration correctly Electronic Signature(s) Signed: 03/22/2021 10:07:46 AM By: Karie Schwalbe RN Entered By: Karie Schwalbe on 03/22/2021 09:57:18 -------------------------------------------------------------------------------- Wound Assessment Details Patient Name: Date of Service: [name], [name] 03/22/2021 8:15 A M Medical Record Number: [id number] Patient Account Number: [id number] Date of Birth/Sex: Treating RN: [birth year] (76 y.o. [name] Primary Care Margean Korell: [name] Other Clinician: Referring Jyden Kromer: Treating Jiya Kissinger/Extender: Gwyndolyn Kaufman Weeks in Treatment: 6 Wound Status Wound Number: 1 Primary Etiology: Venous Leg Ulcer Wound Location: Right, Lateral Lower Leg Wound Status: Open Wounding Event: Trauma Comorbid History: Osteoarthritis Date Acquired: 12/15/2020 Weeks Of Treatment: 6 Clustered  Wound: No Photos Wound Measurements Length: (cm) 0.6 Width: (cm) 0.5 Depth: (cm) 0.5 Area: (cm) 0.236 Volume: (cm) 0.118 % Reduction in Area: 88% % Reduction in Volume: 97% Epithelialization: None Tunneling: Yes Position (o'clock): 12 Maximum Distance: (cm) 2.9 Undermining: Yes Starting Position (o'clock): 10 Ending Position (o'clock): 4 Maximum Distance: (cm) 1.4 Wound Description Classification: Full Thickness With Exposed Support Struct Wound Margin: Distinct, outline attached Exudate Amount: Medium Exudate Type: Serosanguineous Exudate Color: red, brown ures Foul Odor After Cleansing: No Slough/Fibrino Yes Wound Bed Granulation Amount: Large (67-100%) Exposed Structure Granulation Quality: Red Fascia Exposed: No Necrotic Amount: None Present (0%) Fat Layer (Subcutaneous Tissue) Exposed: Yes Tendon Exposed: No Muscle Exposed: No Joint Exposed: No Bone Exposed: No Treatment Notes Wound #1 (Lower Leg) Wound Laterality: Right, Lateral Cleanser Soap and Water Discharge Instruction: cover right leg. May shower. Peri-Wound Care Sween Lotion (Moisturizing lotion) Discharge Instruction: Apply moisturizing lotion as directed Topical Mupirocin Ointment Discharge Instruction: Apply Mupirocin (Bactroban) as instructed Primary Dressing SNAP Therapy System Dressing, 6x6 (in/in) Secondary Dressing Secured With Compression Wrap ThreePress (3 layer compression wrap) Discharge Instruction: Apply three layer compression as directed. Compression Stockings Add-Ons Electronic Signature(s) Signed: 03/22/2021 9:49:12 AM By: Karl Ito Signed: 03/22/2021 10:07:46 AM By: Karie Schwalbe RN Entered By: Karl Ito on 03/22/2021 08:57:56 -------------------------------------------------------------------------------- Vitals Details Patient Name: Date of Service: [name], [name] 03/22/2021 8:15 A M Medical Record Number: [id number] Patient Account Number: [id number] Date  of Birth/Sex: Treating RN: 05/20/1945 (76 y.o. [name] Primary Care Minnette Merida: [name] Other Clinician: Referring Jamille Fisher: Treating Gabriell Daigneault/Extender: Gwyndolyn Kaufman Weeks in Treatment: 6 Vital Signs Time Taken: 08:46 Temperature (F): 97.9 Height (in): 68 Pulse (bpm): 78 Weight (lbs): 200 Respiratory Rate (breaths/min): 18 Body Mass Index (BMI): 30.4 Blood Pressure (mmHg): 146/84 Reference Range: 80 -  120 mg / dl Electronic Signature(s) Signed: 03/22/2021 10:07:46 AM By: Karie Schwalbe RN Entered By: Karie Schwalbe on 03/22/2021 08:46:50

## 2021-03-29 ENCOUNTER — Encounter (HOSPITAL_BASED_OUTPATIENT_CLINIC_OR_DEPARTMENT_OTHER): Payer: Medicare Other | Admitting: General Surgery

## 2021-03-29 ENCOUNTER — Other Ambulatory Visit: Payer: Self-pay

## 2021-03-29 DIAGNOSIS — S8011XA Contusion of right lower leg, initial encounter: Secondary | ICD-10-CM | POA: Diagnosis not present

## 2021-03-29 NOTE — Progress Notes (Signed)
Sherrow, Daneshia ([id number]) ?Visit Report for 03/29/2021 ?Chief Complaint Document Details ?Patient Name: Date of Service: ?TARESSA, KILIAN 03/29/2021 8:15 A M ?Medical Record Number: [id number] ?Patient Account Number: [id number] ?Date of Birth/Sex: Treating RN: ?[birth year] (76 y.o. F) ?Primary Care Provider: Inda Coke Other Clinician: ?Referring Provider: ?Treating Provider/Extender: Fredirick Maudlin ?Inda Coke ?Weeks in Treatment: 7 ?Information Obtained from: Patient ?Chief Complaint ?02/08/2021; patient is here for review of a wound on the right lateral lower leg which was initially traumatic ?Electronic Signature(s) ?Signed: 03/29/2021 9:07:12 AM By: Fredirick Maudlin MD FACS ?Entered By: Fredirick Maudlin on 03/29/2021 09:07:12 ?-------------------------------------------------------------------------------- ?HPI Details ?Patient Name: Date of Service: ?NEVEA, SARTORIUS 03/29/2021 8:15 A M ?Medical Record Number: [id number] ?Patient Account Number: [id number] ?Date of Birth/Sex: Treating RN: ?01-22-1945 (76 y.o. F) ?Primary Care Provider: Inda Coke Other Clinician: ?Referring Provider: ?Treating Provider/Extender: Fredirick Maudlin ?Inda Coke ?Weeks in Treatment: 7 ?History of Present Illness ?HPI Description: ADMISSION ?02/08/2021 ?This is a 76 year old woman who suffered a fall in early December while going down stairs.. She was seen in the ER. She was given Rocephin and Keflex. She ?was also followed up in the ER several days later. A DVT rule out was negative. ?On 1/9 she started following up with her primary doctor. Notable for the fact that she was having some bleeding. She was continued on Keflex on 1/12 using an ?Ace wrap. She was referred to our clinic for review. She is not on any anticoagulants although she is on aspirin. She has a history of vein ligation in 2005 when ?she was in [place]. She tells me she did wear compression socks the first week that she had this done and more recently  has been using Ace wraps. ?Past medical history includes congestive heart failure, psoriasis, arthritis, right total knee replacement, vein stripping as noted. 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 ?The wound is on her right lateral lower extremity. Her ABIs were 1.1 in our clinic ?2/1; patient was admitted the clinic last week she had a contusion on the right lateral leg. It had fully evacuated for the most part when she came here. No real ?problem with this is not just the physical depth but the undermining depth superiorly. Culture I did of this last week is still pending but obviously did not grow the ?standard worrisome organisms including staph strep and the common gram-negative's. I did not give her antibiotics. We have been using silver alginate. ?2/8; patient's wound tunneling superiorly on the right leg 4.5 cm which is an improvement. Culture I did last week was negative so showing only diphtheroids. ?She does not seem to need any additional systemic antibiotics. ?At the suggestion of our intake nurse I think the best [facility] choice here would be a snap VAC under compression ?2/15; no improvement in the superior tunnel now 1 week with a snap VAC under compression. Our intake nurse points out an additional tunnel at 9:00 which I did ?not identify last time. The surface of the wound looks better. There is some drainage and she is complaining of pain ?2/22; the tunnel superiorly is now down 1 cm but not the tunnel at 9:00 area this is a probing wound going superiorly. There is no superficial aspect of this. We ?are using a snap back under compression. 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 I don't think this had any relevance as it was only "rare" ?03/15/2021: The tunneling continues to improve. Overall, the wound dimensions  are roughly the same. Based upon the coag negative staph culture, Dr. Dellia Nims ?added mupirocin. She continues in a snap VAC with  3 layer compression. ?03/22/2021: Continued improvement in the depth of the tunneling. Last night, she says that the snap cartridge was full and she experienced some pain. Overall ?wound dimensions appear about the same otherwise. ?03/29/2021: The depth of the tunneling has come down to 1.4 cm. She has not had any further issues with the snap VAC cartridge filling and causing her pain. ?The outer wound dimensions are roughly the same, but the periwound skin is a little bit macerated. ?Electronic Signature(s) ?Signed: 03/29/2021 9:08:04 AM By: Fredirick Maudlin MD FACS ?Entered By: Fredirick Maudlin on 03/29/2021 09:08:04 ?-------------------------------------------------------------------------------- ?Physical Exam Details ?Patient Name: Date of Service: ?DEQUISHA, GOLDBAUM 03/29/2021 8:15 A M ?Medical Record Number: [id number] ?Patient Account Number: [id number] ?Date of Birth/Sex: Treating RN: ?1945-03-11 (76 y.o. F) ?Primary Care Provider: Inda Coke Other Clinician: ?Referring Provider: ?Treating Provider/Extender: Fredirick Maudlin ?Inda Coke ?Weeks in Treatment: 7 ?Constitutional ?Within normal range for this patient.. . . . No acute distress. ?Respiratory ?Normal work of breathing on room air. ?Notes ?03/29/2021: Wound examthe tunnel is now down to 1.4 cm. There is no erythema or purulent drainage. The periwound skin is a bit macerated, but no concern ?for infection. ?Electronic Signature(s) ?Signed: 03/29/2021 9:09:09 AM By: Fredirick Maudlin MD FACS ?Entered By: Fredirick Maudlin on 03/29/2021 09:09:09 ?-------------------------------------------------------------------------------- ?Physician Orders Details ?Patient Name: Date of Service: ?CARLIA, SUSKO 03/29/2021 8:15 A M ?Medical Record Number: [id number] ?Patient Account Number: [id number] ?Date of Birth/Sex: Treating RN: ?December 18, 1945 (76 y.o. F) Deaton, Bobbi ?Primary Care Provider: Inda Coke Other Clinician: ?Referring Provider: ?Treating  Provider/Extender: Fredirick Maudlin ?Inda Coke ?Weeks in Treatment: 7 ?Verbal / Phone Orders: No ?Diagnosis Coding ?ICD-10 Coding ?Code Description ?S80.11XD Contusion of right lower leg, subsequent encounter ?L97.818 Non-pressure chronic ulcer of other part of right lower leg with other specified severity ?I87.301 Chronic venous hypertension (idiopathic) without complications of right lower extremity ?Follow-up Appointments ?ppointment in 1 week. - Dr Celine Ahr ?Return A ?Bathing/ Shower/ Hygiene ?May shower with protection but do not get wound dressing(s) wet. ?Negative Presssure Wound Therapy ?Wound #1 Right,Lateral Lower Leg ?SNAP Vac to wound continuously at 163mm/hg pressure ?Blue Foam ?Edema Control - Lymphedema / SCD / Other ?Right Lower Extremity ?Elevate legs to the level of the heart or above for 30 minutes daily and/or when sitting, a frequency of: - throughout the day ?void standing for long periods of time. - Elevate legs throughout the day ?A ?Exercise regularly ?Wound Treatment ?Wound #1 - Lower Leg Wound Laterality: Right, Lateral ?Cleanser: Soap and Water 1 x Per Week ?Discharge Instructions: cover right leg. May shower. ?Peri-Wound Care: Zinc Oxide Ointment 30g tube 1 x Per Week ?Discharge Instructions: Apply a thin Zinc Oxide to periwound wound.e ?Peri-Wound Care: Sween Lotion (Moisturizing lotion) 1 x Per Week ?Discharge Instructions: Apply moisturizing lotion as directed ?Prim Dressing: SNAP Therapy System Dressing, 6x6 (in/in) ?ary 1 x Per Week ?Compression Wrap: ThreePress (3 layer compression wrap) 1 x Per Week ?Discharge Instructions: Apply three layer compression as directed. ?Electronic Signature(s) ?Signed: 03/29/2021 6:42:44 PM By: Fredirick Maudlin MD FACS ?Entered By: Fredirick Maudlin on 03/29/2021 09:09:28 ?-------------------------------------------------------------------------------- ?Problem List Details ?Patient Name: ?Date of Service: ?[name], Laylani 03/29/2021 8:15 A  M ?Medical Record Number: [id number] ?Patient Account Number: [id number] ?Date of Birth/Sex: ?Treating RN: ?19-Jun-1945 (76 y.o. F) Deaton, Bobbi ?Primary Care Provider: Inda Coke ?Other Clinician: ?Referring Provider: ?T

## 2021-03-30 NOTE — Progress Notes (Signed)
Kellogg, Arthur ([id number]) ?Visit Report for 03/29/2021 ?Arrival Information Details ?Patient Name: Date of Service: ?AMETHYST, GAINER 03/29/2021 8:15 A M ?Medical Record Number: [id number] ?Patient Account Number: [id number] ?Date of Birth/Sex: Treating RN: ?May 07, 1945 (76 y.o. F) Deaton, Bobbi ?Primary Care Kijuana Ruppel: [name] Other Clinician: ?Referring Doyt Castellana: ?Treating Minh Jasper/Extender: Duanne Guess ?[name] ?Weeks in Treatment: 7 ?Visit Information History Since Last Visit ?Added or deleted any medications: No ?Patient Arrived: Ambulatory ?Any new allergies or adverse reactions: No ?Arrival Time: 08:17 ?Had a fall or experienced change in No ?Accompanied By: self ?activities of daily living that may affect ?Transfer Assistance: None ?risk of falls: ?Patient Identification Verified: Yes ?Signs or symptoms of abuse/neglect since last visito No ?Secondary Verification Process Completed: Yes ?Hospitalized since last visit: No ?Patient Requires Transmission-Based Precautions: No ?Implantable device outside of the clinic excluding No ?Patient Has Alerts: No ?cellular tissue based products placed in the center ?since last visit: ?Has Dressing in Place as Prescribed: Yes ?Has Compression in Place as Prescribed: Yes ?Pain Present Now: No ?Electronic Signature(s) ?Signed: 03/30/2021 6:07:05 PM By: Shawn Stall RN, BSN ?Entered By: Shawn Stall on 03/29/2021 08:19:22 ?-------------------------------------------------------------------------------- ?Compression Therapy Details ?Patient Name: Date of Service: ?KATESHA, EICHEL 03/29/2021 8:15 A M ?Medical Record Number: [id number] ?Patient Account Number: [id number] ?Date of Birth/Sex: Treating RN: ?15-Jun-1945 (76 y.o. F) Deaton, Bobbi ?Primary Care Azalya Galyon: [name] Other Clinician: ?Referring Camani Sesay: ?Treating Ravina Milner/Extender: Duanne Guess ?[name] ?Weeks in Treatment: 7 ?Compression Therapy Performed for Wound Assessment: Wound #1  Right,Lateral Lower Leg ?Performed By: Clinician Shawn Stall, RN ?Compression Type: Three Layer ?Post Procedure Diagnosis ?Same as Pre-procedure ?Electronic Signature(s) ?Signed: 03/30/2021 6:07:05 PM By: Shawn Stall RN, BSN ?Entered By: Shawn Stall on 03/29/2021 09:06:04 ?-------------------------------------------------------------------------------- ?Encounter Discharge Information Details ?Patient Name: ?Date of Service: ?[name], Lior 03/29/2021 8:15 A M ?Medical Record Number: [id number] ?Patient Account Number: [id number] ?Date of Birth/Sex: ?Treating RN: ?1945-12-11 (76 y.o. F) Deaton, Bobbi ?Primary Care Osmel Dykstra: [name] ?Other Clinician: ?Referring Aerielle Stoklosa: ?Treating Valen Gillison/Extender: Duanne Guess ?[name] ?Weeks in Treatment: 7 ?Encounter Discharge Information Items ?Discharge Condition: Stable ?Ambulatory Status: Ambulatory ?Discharge Destination: Home ?Transportation: Private Auto ?Accompanied By: self ?Schedule Follow-up Appointment: Yes ?Clinical Summary of Care: ?Electronic Signature(s) ?Signed: 03/30/2021 6:07:05 PM By: Shawn Stall RN, BSN ?Entered By: Shawn Stall on 03/29/2021 09:07:57 ?-------------------------------------------------------------------------------- ?Lower Extremity Assessment Details ?Patient Name: ?Date of Service: ?[name], Elleanor 03/29/2021 8:15 A M ?Medical Record Number: [id number] ?Patient Account Number: [id number] ?Date of Birth/Sex: ?Treating RN: ?05/22/1945 (76 y.o. F) Deaton, Bobbi ?Primary Care Phenix Grein: [name] ?Other Clinician: ?Referring Natalynn Pedone: ?Treating Horald Birky/Extender: Duanne Guess ?[name] ?Weeks in Treatment: 7 ?Edema Assessment ?Assessed: [Left: No] [Right: Yes] ?Edema: [Left: Ye] [Right: s] ?Calf ?Left: Right: ?Point of Measurement: 33 cm From Medial Instep 37 cm ?Ankle ?Left: Right: ?Point of Measurement: 7 cm From Medial Instep 21.5 cm ?Vascular Assessment ?Pulses: ?Dorsalis Pedis ?Palpable:  [Right:Yes] ?Electronic Signature(s) ?Signed: 03/30/2021 6:07:05 PM By: Shawn Stall RN, BSN ?Entered By: Shawn Stall on 03/29/2021 08:25:42 ?-------------------------------------------------------------------------------- ?Multi Wound Chart Details ?Patient Name: ?Date of Service: ?[name], Shireen 03/29/2021 8:15 A M ?Medical Record Number: [id number] ?Patient Account Number: [id number] ?Date of Birth/Sex: ?Treating RN: ?Jul 24, 1945 (76 y.o. F) ?Primary Care Daishaun Ayre: [name] ?Other Clinician: ?Referring Christine Morton: ?Treating Anvitha Hutmacher/Extender: Duanne Guess ?[name] ?Weeks in Treatment: 7 ?Vital Signs ?Height(in): 68 ?Pulse(bpm): 73 ?Weight(lbs): 200 ?Blood Pressure(mmHg): 146/84 ?Body Mass Index(BMI): 30.4 ?Temperature(??F): 98.2 ?Respiratory Rate(breaths/min): 20 ?Photos: [N/A:N/A] ?Right, Lateral Lower Leg N/A N/A ?Wound Location: ?Trauma N/A  N/A ?Wounding Event: ?Venous Leg Ulcer N/A N/A ?Primary Etiology: ?Osteoarthritis N/A N/A ?Comorbid History: ?12/15/2020 N/A N/A ?Date Acquired: ?7 N/A N/A ?Weeks of Treatment: ?Open N/A N/A ?Wound Status: ?No N/A N/A ?Wound Recurrence: ?0.4x0.3x0.6 N/A N/A ?Measurements L x W x D (cm) ?0.094 N/A N/A ?A (cm?) : ?rea ?0.057 N/A N/A ?Volume (cm?) : ?95.20% N/A N/A ?% Reduction in A rea: ?98.50% N/A N/A ?% Reduction in Volume: ?12 ?Position 1 (o'clock): ?1.4 ?Maximum Distance 1 (cm): ?Yes N/A N/A ?Tunneling: ?Full Thickness With Exposed Support N/A N/A ?Classification: ?Structures ?Medium N/A N/A ?Exudate Amount: ?Serosanguineous N/A N/A ?Exudate Type: ?red, brown N/A N/A ?Exudate Color: ?Distinct, outline attached N/A N/A ?Wound Margin: ?Large (67-100%) N/A N/A ?Granulation Amount: ?Red N/A N/A ?Granulation Quality: ?None Present (0%) N/A N/A ?Necrotic Amount: ?Fat Layer (Subcutaneous Tissue): Yes N/A N/A ?Exposed Structures: ?Fascia: No ?Tendon: No ?Muscle: No ?Joint: No ?Bone: No ?None N/A N/A ?Epithelialization: ?Compression Therapy N/A N/A ?Procedures  Performed: ?Treatment Notes ?Electronic Signature(s) ?Signed: 03/29/2021 9:07:00 AM By: Duanne Guess MD FACS ?Entered By: Duanne Guess on 03/29/2021 09:06:59 ?-------------------------------------------------------------------------------- ?Multi-Disciplinary Care Plan Details ?Patient Name: ?Date of Service: ?[name], Timberly 03/29/2021 8:15 A M ?Medical Record Number: [id number] ?Patient Account Number: [id number] ?Date of Birth/Sex: ?Treating RN: ?26-Jul-1945 (76 y.o. F) Deaton, Bobbi ?Primary Care Tinita Brooker: [name] ?Other Clinician: ?Referring Javarus Dorner: ?Treating Kamika Goodloe/Extender: Duanne Guess ?[name] ?Weeks in Treatment: 7 ?Multidisciplinary Care Plan reviewed with physician ?Active Inactive ?Venous Leg Ulcer ?Nursing Diagnoses: ?Knowledge deficit related to disease process and management ?Potential for venous Insuffiency (use before diagnosis confirmed) ?Goals: ?Patient will maintain optimal edema control ?Date Initiated: 02/22/2021 ?Target Resolution Date: 05/12/2021 ?Goal Status: Active ?Interventions: ?Assess peripheral edema status every visit. ?Compression as ordered ?Provide education on venous insufficiency ?Treatment Activities: ?Therapeutic compression applied : 02/22/2021 ?Notes: ?Wound/Skin Impairment ?Nursing Diagnoses: ?Impaired tissue integrity ?Goals: ?Patient/caregiver will verbalize understanding of skin care regimen ?Date Initiated: 02/08/2021 ?Target Resolution Date: 05/11/2021 ?Goal Status: Active ?Interventions: ?Assess ulceration(s) every visit ?Notes: ?Electronic Signature(s) ?Signed: 03/30/2021 6:07:05 PM By: Shawn Stall RN, BSN ?Entered By: Shawn Stall on 03/29/2021 08:45:11 ?-------------------------------------------------------------------------------- ?Negative Pressure Wound Therapy Maintenance (NPWT) Details ?Patient Name: ?Date of Service: ?[name], Girl 03/29/2021 8:15 A M ?Medical Record Number: [id number] ?Patient Account Number: [id number] ?Date of  Birth/Sex: ?Treating RN: ?06-30-1945 (76 y.o. F) Deaton, Bobbi ?Primary Care Wilgus Deyton: [name] ?Other Clinician: ?Referring Ashlynn Gunnels: ?Treating Jazziel Fitzsimmons/Extender: Duanne Guess ?[name] ?Weeks in Treatment: 7 ?NPWT Francetta Found

## 2021-04-03 ENCOUNTER — Other Ambulatory Visit: Payer: Self-pay

## 2021-04-03 ENCOUNTER — Encounter (HOSPITAL_BASED_OUTPATIENT_CLINIC_OR_DEPARTMENT_OTHER): Payer: Self-pay

## 2021-04-03 ENCOUNTER — Encounter (HOSPITAL_BASED_OUTPATIENT_CLINIC_OR_DEPARTMENT_OTHER): Payer: Medicare Other | Admitting: General Surgery

## 2021-04-05 ENCOUNTER — Other Ambulatory Visit: Payer: Self-pay

## 2021-04-05 ENCOUNTER — Encounter (HOSPITAL_BASED_OUTPATIENT_CLINIC_OR_DEPARTMENT_OTHER): Payer: Medicare Other | Admitting: General Surgery

## 2021-04-05 DIAGNOSIS — S8011XA Contusion of right lower leg, initial encounter: Secondary | ICD-10-CM | POA: Diagnosis not present

## 2021-04-05 NOTE — Progress Notes (Signed)
[name], [name] ([id number]) ?Visit Report for 04/05/2021 ?Arrival Information Details ?Patient Name: Date of Service: ?[name], [name] 04/05/2021 7:30 A M ?Medical Record Number: [id number] ?Patient Account Number: [id number] ?Date of Birth/Sex: Treating RN: ?[birth year] (76 y.o. F) [name], Gaetana ?Primary Care Kaelani Kendrick: [name] Other Clinician: ?Referring Avilyn Virtue: ?Treating Undrea Archbold/Extender: Duanne Guess ?[name] ?Weeks in Treatment: 8 ?Visit Information History Since Last Visit ?Added or deleted any medications: No ?Patient Arrived: Ambulatory ?Any new allergies or adverse reactions: No ?Arrival Time: 07:37 ?Had a fall or experienced change in No ?Accompanied By: self ?activities of daily living that may affect ?Transfer Assistance: None ?risk of falls: ?Patient Identification Verified: Yes ?Signs or symptoms of abuse/neglect since last visito No ?Secondary Verification Process Completed: Yes ?Hospitalized since last visit: No ?Patient Requires Transmission-Based Precautions: No ?Implantable device outside of the clinic excluding No ?Patient Has Alerts: No ?cellular tissue based products placed in the center ?since last visit: ?Has Dressing in Place as Prescribed: Yes ?Has Compression in Place as Prescribed: Yes ?Pain Present Now: No ?Electronic Signature(s) ?Signed: 04/05/2021 5:05:47 PM By: Zenaida Deed RN, BSN ?Entered By: Zenaida Deed on 04/05/2021 07:40:49 ?-------------------------------------------------------------------------------- ?Compression Therapy Details ?Patient Name: Date of Service: ?PAT, [name] 04/05/2021 7:30 A M ?Medical Record Number: [id number] ?Patient Account Number: [id number] ?Date of Birth/Sex: Treating RN: ?[birth year] (76 y.o. F) [name], [name] ?Primary Care Mykiah Schmuck: [name] Other Clinician: ?Referring Dariane Natzke: ?Treating Kelsy Polack/Extender: Duanne Guess ?[name] ?Weeks in Treatment: 8 ?Compression Therapy Performed for Wound Assessment: Wound  #1 Right,Lateral Lower Leg ?Performed By: Clinician Zenaida Deed, RN ?Compression Type: Three Layer ?Post Procedure Diagnosis ?Same as Pre-procedure ?Electronic Signature(s) ?Signed: 04/05/2021 5:05:47 PM By: Zenaida Deed RN, BSN ?Entered By: Zenaida Deed on 04/05/2021 08:01:26 ?-------------------------------------------------------------------------------- ?Encounter Discharge Information Details ?Patient Name: ?Date of Service: ?Earle [name], [name] 04/05/2021 7:30 A M ?Medical Record Number: [id number] ?Patient Account Number: [id number] ?Date of Birth/Sex: ?Treating RN: ?1945-11-30 (76 y.o. F) [name], Jaidee ?Primary Care Mahitha Hickling: [name] ?Other Clinician: ?Referring Kijuan Gallicchio: ?Treating Deya Bigos/Extender: Duanne Guess ?[name] ?Weeks in Treatment: 8 ?Encounter Discharge Information Items ?Discharge Condition: Stable ?Ambulatory Status: Ambulatory ?Discharge Destination: Home ?Transportation: Private Auto ?Schedule Follow-up Appointment: Yes ?Clinical Summary of Care: Patient Declined ?Electronic Signature(s) ?Signed: 04/05/2021 5:05:47 PM By: Zenaida Deed RN, BSN ?Entered By: Zenaida Deed on 04/05/2021 36:64:40 ?-------------------------------------------------------------------------------- ?Lower Extremity Assessment Details ?Patient Name: ?Date of Service: ?Earle [name], [name] 04/05/2021 7:30 A M ?Medical Record Number: [id number] ?Patient Account Number: [id number] ?Date of Birth/Sex: ?Treating RN: ?06/29/1945 (76 y.o. F) [name], Sheyna ?Primary Care Nykira Reddix: [name] ?Other Clinician: ?Referring Akane Tessier: ?Treating Sheril Hammond/Extender: Duanne Guess ?[name] ?Weeks in Treatment: 8 ?Edema Assessment ?Assessed: [Left: No] [Right: No] ?Edema: [Left: Ye] [Right: s] ?Calf ?Left: Right: ?Point of Measurement: 33 cm From Medial Instep 36.5 cm ?Ankle ?Left: Right: ?Point of Measurement: 7 cm From Medial Instep 22 cm ?Vascular Assessment ?Pulses: ?Dorsalis Pedis ?Palpable:  [Right:Yes] ?Electronic Signature(s) ?Signed: 04/05/2021 5:05:47 PM By: Zenaida Deed RN, BSN ?Entered By: Zenaida Deed on 04/05/2021 07:49:59 ?-------------------------------------------------------------------------------- ?Multi Wound Chart Details ?Patient Name: ?Date of Service: ?Earle [name], Jaidy 04/05/2021 7:30 A M ?Medical Record Number: [id number] ?Patient Account Number: [id number] ?Date of Birth/Sex: ?Treating RN: ?Mar 19, 1945 (76 y.o. F) [name], [name] ?Primary Care Cerra Eisenhower: [name] ?Other Clinician: ?Referring Kripa Foskey: ?Treating Jerone Cudmore/Extender: Duanne Guess ?[name] ?Weeks in Treatment: 8 ?Vital Signs ?Height(in): 68 ?Pulse(bpm): 78 ?Weight(lbs): 200 ?Blood Pressure(mmHg): 121/72 ?Body Mass Index(BMI): 30.4 ?Temperature(??F): 98.7 ?Respiratory Rate(breaths/min): 18 ?Photos: [N/A:N/A] ?Right, Lateral Lower Leg N/A N/A ?Wound Location: ?Trauma  N/A N/A ?Wounding Event: ?Venous Leg Ulcer N/A N/A ?Primary Etiology: ?Osteoarthritis N/A N/A ?Comorbid History: ?12/15/2020 N/A N/A ?Date Acquired: ?8 N/A N/A ?Weeks of Treatment: ?Open N/A N/A ?Wound Status: ?No N/A N/A ?Wound Recurrence: ?0.9x0.4x0.7 N/A N/A ?Measurements L x W x D (cm) ?0.283 N/A N/A ?A (cm?) : ?rea ?0.198 N/A N/A ?Volume (cm?) : ?85.60% N/A N/A ?% Reduction in A rea: ?95.00% N/A N/A ?% Reduction in Volume: ?12 ?Position 1 (o'clock): ?1.3 ?Maximum Distance 1 (cm): ?Yes N/A N/A ?Tunneling: ?Full Thickness With Exposed Support N/A N/A ?Classification: ?Structures ?Medium N/A N/A ?Exudate Amount: ?Serosanguineous N/A N/A ?Exudate Type: ?red, brown N/A N/A ?Exudate Color: ?Distinct, outline attached N/A N/A ?Wound Margin: ?Large (67-100%) N/A N/A ?Granulation Amount: ?Red N/A N/A ?Granulation Quality: ?None Present (0%) N/A N/A ?Necrotic Amount: ?Fat Layer (Subcutaneous Tissue): Yes N/A N/A ?Exposed Structures: ?Fascia: No ?Tendon: No ?Muscle: No ?Joint: No ?Bone: No ?Small (1-33%) N/A N/A ?Epithelialization: ?Compression  Therapy N/A N/A ?Procedures Performed: ?Negative Pressure Wound Therapy ?Maintenance (NPWT) ?Treatment Notes ?Electronic Signature(s) ?Signed: 04/05/2021 8:02:05 AM By: Duanne Guess MD FACS ?Signed: 04/05/2021 5:05:47 PM By: Zenaida Deed RN, BSN ?Entered By: Duanne Guess on 04/05/2021 08:02:04 ?-------------------------------------------------------------------------------- ?Multi-Disciplinary Care Plan Details ?Patient Name: ?Date of Service: ?Earle [name], Lue 04/05/2021 7:30 A M ?Medical Record Number: [id number] ?Patient Account Number: [id number] ?Date of Birth/Sex: ?Treating RN: ?07/12/1945 (76 y.o. F) [name], Destinie ?Primary Care Garrison Michie: [name] ?Other Clinician: ?Referring Lezley Bedgood: ?Treating Aceton Kinnear/Extender: Duanne Guess ?[name] ?Weeks in Treatment: 8 ?Multidisciplinary Care Plan reviewed with physician ?Active Inactive ?Venous Leg Ulcer ?Nursing Diagnoses: ?Knowledge deficit related to disease process and management ?Potential for venous Insuffiency (use before diagnosis confirmed) ?Goals: ?Patient will maintain optimal edema control ?Date Initiated: 02/22/2021 ?Target Resolution Date: 05/12/2021 ?Goal Status: Active ?Interventions: ?Assess peripheral edema status every visit. ?Compression as ordered ?Provide education on venous insufficiency ?Treatment Activities: ?Therapeutic compression applied : 02/22/2021 ?Notes: ?Wound/Skin Impairment ?Nursing Diagnoses: ?Impaired tissue integrity ?Goals: ?Patient/caregiver will verbalize understanding of skin care regimen ?Date Initiated: 02/08/2021 ?Target Resolution Date: 05/11/2021 ?Goal Status: Active ?Interventions: ?Assess ulceration(s) every visit ?Notes: ?Electronic Signature(s) ?Signed: 04/05/2021 5:05:47 PM By: Zenaida Deed RN, BSN ?Entered By: Zenaida Deed on 04/05/2021 07:53:37 ?-------------------------------------------------------------------------------- ?Negative Pressure Wound Therapy Maintenance (NPWT) Details ?Patient  Name: ?Date of Service: ?Earle [name], [place] 04/05/2021 7:30 A M ?Medical Record Number: [id number] ?Patient Account Number: [id number] ?Date of Birth/Sex: ?Treating RN: ?[birth year] (76 y.o. F) [name], Kayliana ?Primary Car

## 2021-04-05 NOTE — Progress Notes (Addendum)
Hengst, Norine ([id number]) ?Visit Report for 04/05/2021 ?Chief Complaint Document Details ?Patient Name: Date of Service: ?SYNDEY, MERENDINO 04/05/2021 7:30 A M ?Medical Record Number: [id number] ?Patient Account Number: [id number] ?Date of Birth/Sex: Treating RN: ?1945-07-07 (76 y.o. F) [name], Anarely ?Primary Care Provider: Inda Coke Other Clinician: ?Referring Provider: ?Treating Provider/Extender: Fredirick Maudlin ?Inda Coke ?Weeks in Treatment: 8 ?Information Obtained from: Patient ?Chief Complaint ?02/08/2021; patient is here for review of a wound on the right lateral lower leg which was initially traumatic ?Electronic Signature(s) ?Signed: 04/05/2021 8:02:15 AM By: Fredirick Maudlin MD FACS ?Entered By: Fredirick Maudlin on 04/05/2021 08:02:15 ?-------------------------------------------------------------------------------- ?HPI Details ?Patient Name: Date of Service: ?MYIA, KEGEL 04/05/2021 7:30 A M ?Medical Record Number: [id number] ?Patient Account Number: [id number] ?Date of Birth/Sex: Treating RN: ?April 02, 1945 (76 y.o. F) [name], Roanne ?Primary Care Provider: Inda Coke Other Clinician: ?Referring Provider: ?Treating Provider/Extender: Fredirick Maudlin ?Inda Coke ?Weeks in Treatment: 8 ?History of Present Illness ?HPI Description: ADMISSION ?02/08/2021 ?This is a 76 year old woman who suffered a fall in early December while going down stairs.. She was seen in the ER. She was given Rocephin and Keflex. She ?was also followed up in the ER several days later. A DVT rule out was negative. ?On 1/9 she started following up with her primary doctor. Notable for the fact that she was having some bleeding. She was continued on Keflex on 1/12 using an ?Ace wrap. She was referred to our clinic for review. She is not on any anticoagulants although she is on aspirin. She has a history of vein ligation in 2005 when ?she was in [place]. She tells me she did wear compression socks the first week that she  had this done and more recently has been using Ace wraps. ?Past medical history includes congestive heart failure, psoriasis, arthritis, right total knee replacement, vein stripping as noted. 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 ?The wound is on her right lateral lower extremity. Her ABIs were 1.1 in our clinic ?2/1; patient was admitted the clinic last week she had a contusion on the right lateral leg. It had fully evacuated for the most part when she came here. No real ?problem with this is not just the physical depth but the undermining depth superiorly. Culture I did of this last week is still pending but obviously did not grow the ?standard worrisome organisms including staph strep and the common gram-negative's. I did not give her antibiotics. We have been using silver alginate. ?2/8; patient's wound tunneling superiorly on the right leg 4.5 cm which is an improvement. Culture I did last week was negative so showing only diphtheroids. ?She does not seem to need any additional systemic antibiotics. ?At the suggestion of our intake nurse I think the best [facility] choice here would be a snap VAC under compression ?2/15; no improvement in the superior tunnel now 1 week with a snap VAC under compression. Our intake nurse points out an additional tunnel at 9:00 which I did ?not identify last time. The surface of the wound looks better. There is some drainage and she is complaining of pain ?2/22; the tunnel superiorly is now down 1 cm but not the tunnel at 9:00 area this is a probing wound going superiorly. There is no superficial aspect of this. We ?are using a snap back under compression. 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 I don't think this had any relevance as it was only "rare" ?03/15/2021: The tunneling continues to improve.  Overall, the wound dimensions are roughly the same. Based upon the coag negative staph culture, Dr. Dellia Nims ?added mupirocin.  She continues in a snap VAC with 3 layer compression. ?03/22/2021: Continued improvement in the depth of the tunneling. Last night, she says that the snap cartridge was full and she experienced some pain. Overall ?wound dimensions appear about the same otherwise. ?03/29/2021: The depth of the tunneling has come down to 1.4 cm. She has not had any further issues with the snap VAC cartridge filling and causing her pain. ?The outer wound dimensions are roughly the same, but the periwound skin is a little bit macerated. ?04/05/2021: The depth of the tunneling continues to decrease. No issues with VAC function. Periwound skin looks better. ?Electronic Signature(s) ?Signed: 04/05/2021 8:03:04 AM By: Fredirick Maudlin MD FACS ?Entered By: Fredirick Maudlin on 04/05/2021 08:03:03 ?-------------------------------------------------------------------------------- ?Physical Exam Details ?Patient Name: Date of Service: ?LATASHIA, TIMBERLAKE 04/05/2021 7:30 A M ?Medical Record Number: [id number] ?Patient Account Number: [id number] ?Date of Birth/Sex: Treating RN: ?[birth year] (76 y.o. F) [name], Avantika ?Primary Care Provider: Inda Coke Other Clinician: ?Referring Provider: ?Treating Provider/Extender: Fredirick Maudlin ?Inda Coke ?Weeks in Treatment: 8 ?Constitutional ?. . . . No acute distress. ?Respiratory ?Normal work of breathing on room air. ?Notes ?04/05/2021: Wound examthe tunnel continues to decrease in depth. No erythema, induration, or purulent drainage. The periwound skin looks better this week. ?No concern for infection. Edema control is good. ?Electronic Signature(s) ?Signed: 04/05/2021 8:04:11 AM By: Fredirick Maudlin MD FACS ?Entered By: Fredirick Maudlin on 04/05/2021 08:04:11 ?-------------------------------------------------------------------------------- ?Physician Orders Details ?Patient Name: Date of Service: ?MAVIS, KARNITZ 04/05/2021 7:30 A M ?Medical Record Number: [id number] ?Patient Account Number: [id number] ?Date  of Birth/Sex: Treating RN: ?1945/06/19 (76 y.o. 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 - Dr Celine Ahr with [facility] ?Return A ?Bathing/ Shower/ Hygiene ?May shower with protection but do not get wound dressing(s) wet. ?Negative Presssure Wound Therapy ?Wound #1 Right,Lateral Lower Leg ?SNAP Vac to wound continuously at 153mm/hg pressure ?Blue Foam ?Edema Control - Lymphedema / SCD / Other ?Right Lower Extremity ?Elevate legs to the level of the heart or above for 30 minutes daily and/or when sitting, a frequency of: - throughout the day ?void standing for long periods of time. - Elevate legs throughout the day ?A ?Exercise regularly ?Wound Treatment ?Wound #1 - Lower Leg Wound Laterality: Right, Lateral ?Cleanser: Soap and Water 1 x Per Week ?Discharge Instructions: cover right leg. May shower. ?Peri-Wound Care: Sween Lotion (Moisturizing lotion) 1 x Per Week ?Discharge Instructions: Apply moisturizing lotion as directed ?Prim Dressing: Promogran Prisma Matrix, 4.34 (sq in) (silver collagen) 1 x Per Week ?ary ?Discharge Instructions: Moisten collagen with saline place at base of tunnel ?Prim Dressing: SNAP Therapy System Dressing, 6x6 (in/in) ?ary 1 x Per Week ?Compression Wrap: ThreePress (3 layer compression wrap) 1 x Per Week ?Discharge Instructions: Apply three layer compression as directed. ?Electronic Signature(s) ?Signed: 04/05/2021 3:04:13 PM By: Fredirick Maudlin MD FACS ?Entered By: Fredirick Maudlin on 04/05/2021 08:04:32 ?-------------------------------------------------------------------------------- ?Problem List Details ?Patient Name: ?Date of Service: ?[name], Alane 04/05/2021 7:30 A M ?Me

## 2021-04-10 IMAGING — XA IR KYPHO VERTEBRAL THORACIC AUGMENTATION
1 series · 13 of 24 positions shown · IV contrast (IODINE)
Comparison: MRI of the lumbosacral spine December 23, 2018.

INDICATION: Severe low back pain secondary to compression fracture at T10.

EXAM:
BALLOON KYPHOPLASTY AT T10

[Series 300: spine · 13 of 26 slices shown]
[im 1/26]
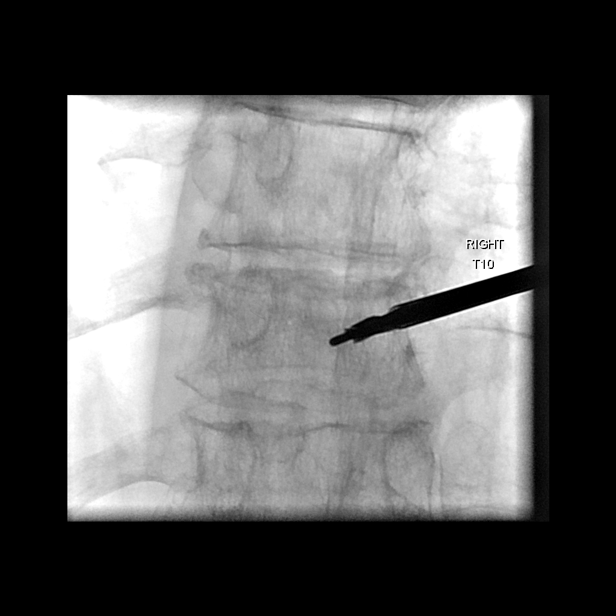
[im 3/26]
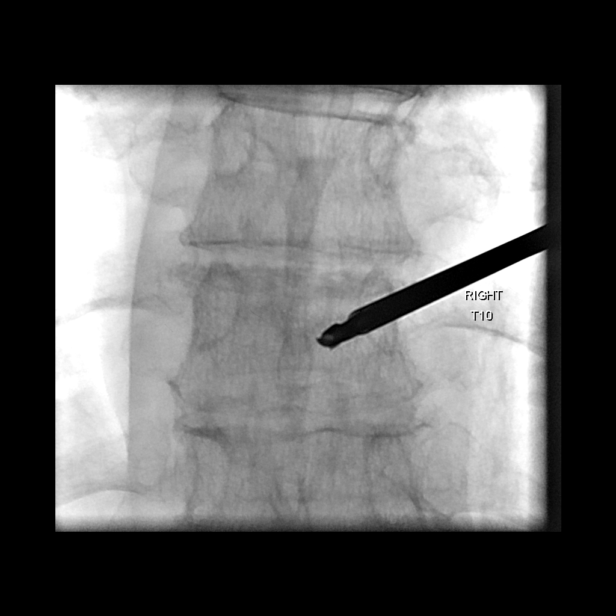
[im 5/26]
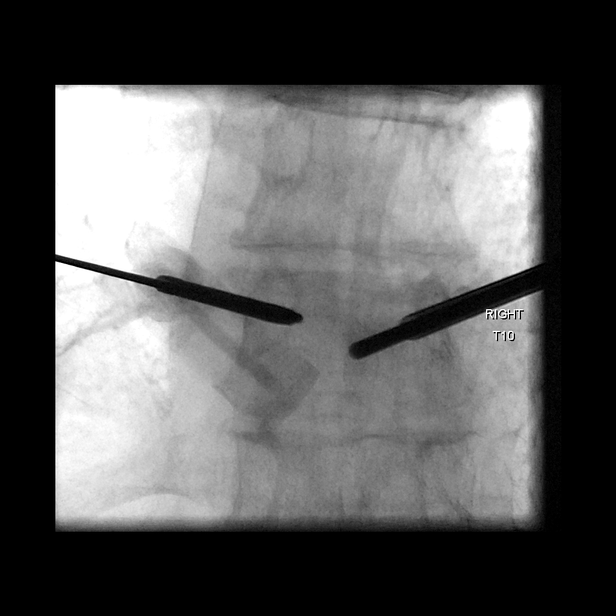
[im 7/26]
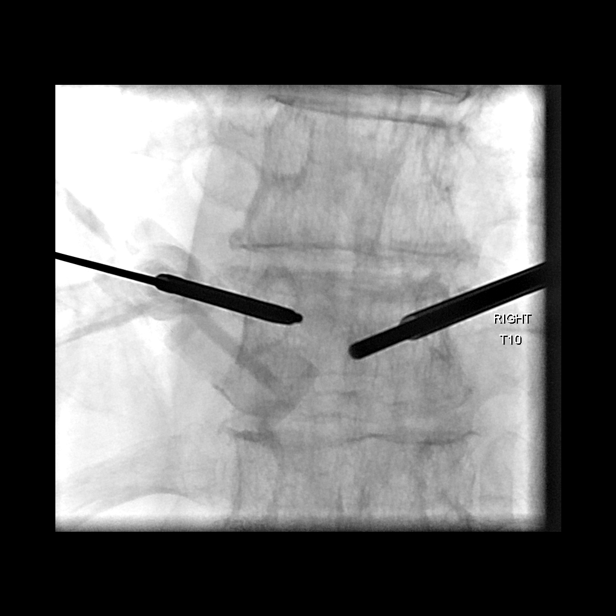
[im 9/26]
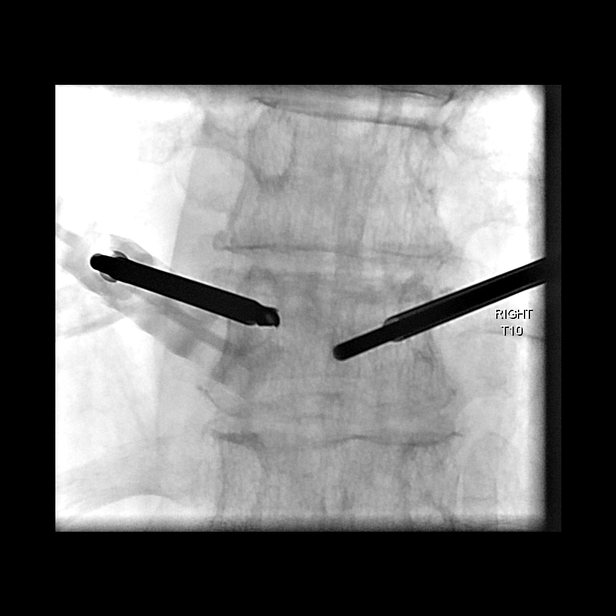
[im 11/26]
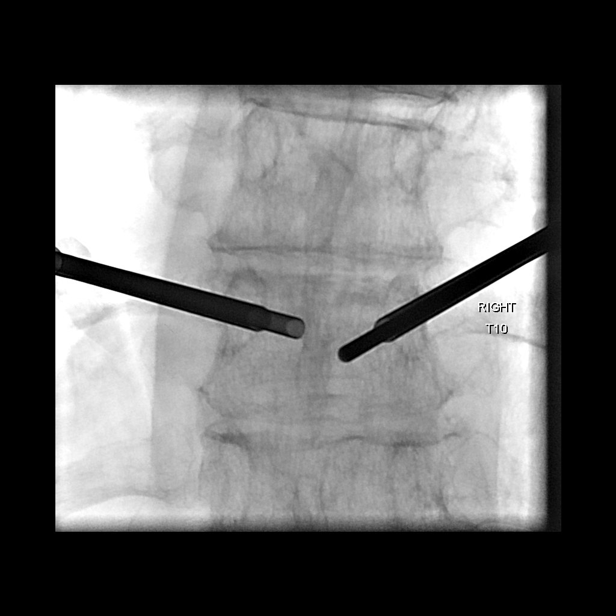
[im 14/26]
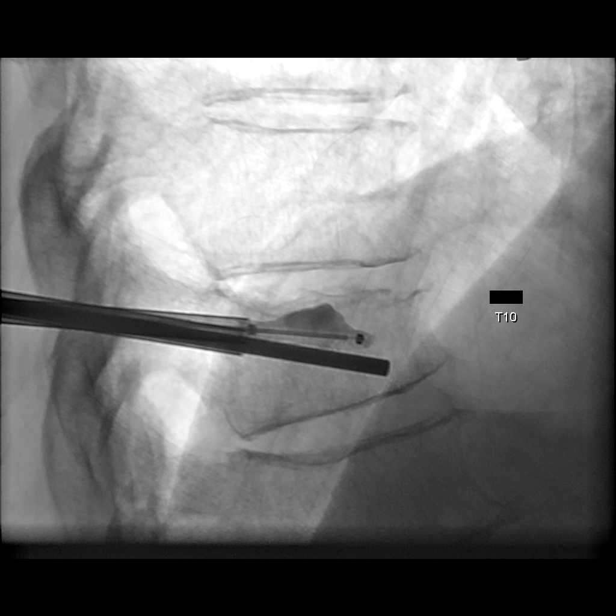
[im 15/26]
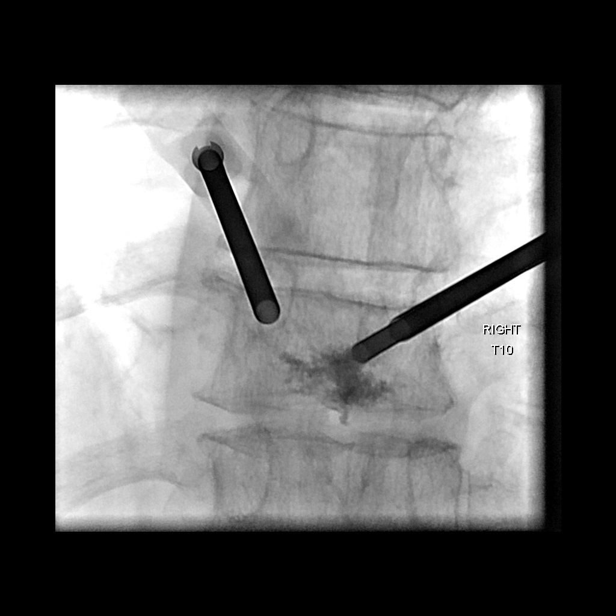
[im 17/26]
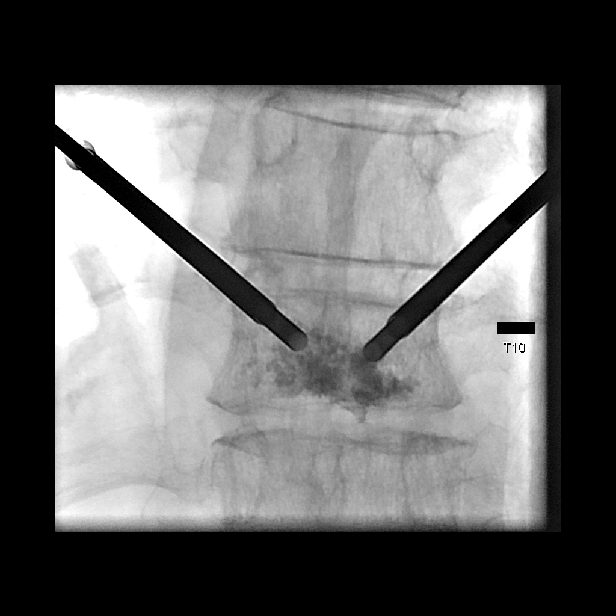
[im 19/26]
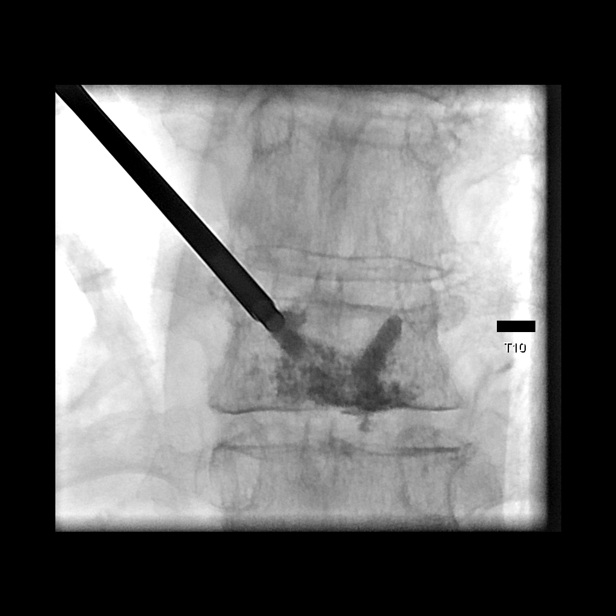
[im 21/26]
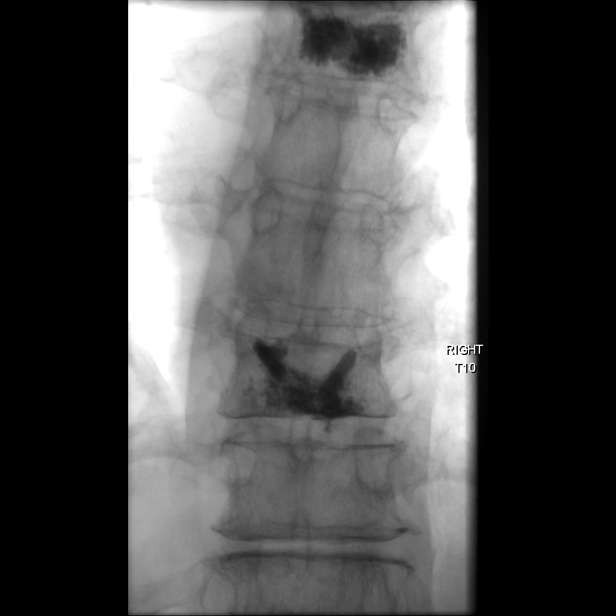
[im 23/26]
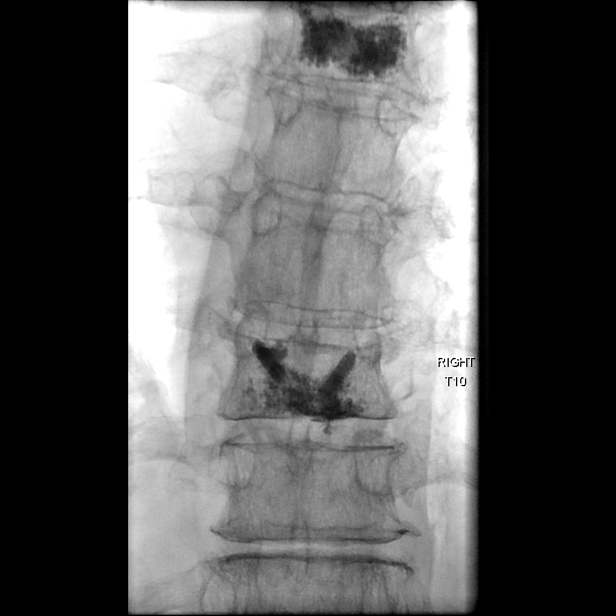
[im 26/26]
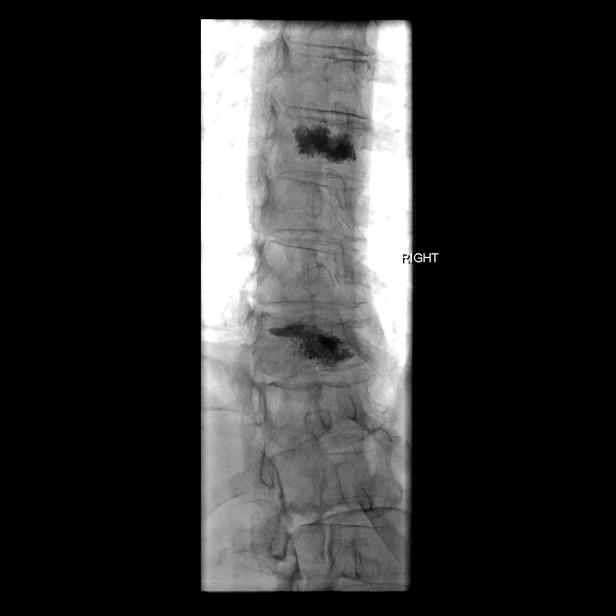

[13 of 24 positions shown; findings below may reference images not displayed]

MEDICATIONS:
As antibiotic prophylaxis, Ancef 2 g IV was ordered pre-procedure
and administered intravenously within 1 hour of incision.

ANESTHESIA/SEDATION:
Moderate (conscious) sedation was employed during this procedure. A
total of Versed 2 mg and Fentanyl 50 mcg was administered
intravenously.

Moderate Sedation Time: 34 minutes. The patient's level of
consciousness and vital signs were monitored continuously by
radiology nursing throughout the procedure under my direct
supervision.

FLUOROSCOPY TIME:  Fluoroscopy Time: 13 minutes 48 seconds (6105
mGy)

COMPLICATIONS:
None immediate.

PROCEDURE:
Following a full explanation of the procedure along with the
potential associated complications, an informed witnessed consent
was obtained.

The patient was placed prone on the fluoroscopic table. The skin
overlying the thoracic region was then prepped and draped in the
usual sterile fashion. Both pedicles at T10 were then infiltrated
with 0.25% bupivacaine followed by the advancement of an 11-gauge
Jamshidi needle through the pedicles into the posterior one-third at
T10. These were then exchanged for a Kyphon advanced osteo
introducer system comprised of a working cannula and a Kyphon osteo
drill.

These combinations were then advanced over a Kyphon osteo bone pin
until the tip of the Kyphon osteo drill was in the posterior third
at T10.

At this time, the bone pins were removed. In a medial trajectory,
the combination were advanced until the tips of the working cannula
were inside the posterior one-third at T10.

The osteo drills were removed.

Through each working cannula, a Kyphon inflatable bone tamp 20 x 3
was advanced and positioned with the distal marker 5 mm from the
anterior aspect of T10. Crossing of the midline was seen on the AP
projection. At this time, the balloon was expanded using contrast
via a Kyphon inflation syringe device via microtubing.

Inflations were continued until there was apposition with the
superior and the inferior endplates. Slight herniation of the
balloon was seen through the inferior endplate on the right. This
necessitated the use of Gelfoam pledgets through the working cannula
on right side.

At this time, methylmethacrylate mixture was reconstituted with
Tobramycin in the Kyphon bone mixing device system. This was then
loaded onto the Kyphon bone fillers.

The balloons were deflated and removed followed by the instillation
of 2-1/2 bone filler equivalents of methylmethacrylate mixture at
T10 with excellent filling in the AP and lateral projections. No
extravasation was noted in the disk spaces or posteriorly into the
spinal canal. No epidural venous contamination was seen.

The working cannulae and the bone fillers were then retrieved and
removed. Hemostasis was achieved at the skin entry site. The patient
tolerated the procedure well. There were no acute complications.
IMPRESSION: Status post vertebral body augmentation using balloon kyphoplasty at
T10 as described without event.

## 2021-04-12 ENCOUNTER — Encounter (HOSPITAL_BASED_OUTPATIENT_CLINIC_OR_DEPARTMENT_OTHER): Payer: Medicare Other | Admitting: General Surgery

## 2021-04-12 ENCOUNTER — Other Ambulatory Visit: Payer: Self-pay

## 2021-04-12 DIAGNOSIS — S8011XA Contusion of right lower leg, initial encounter: Secondary | ICD-10-CM | POA: Diagnosis not present

## 2021-04-13 NOTE — Progress Notes (Signed)
[name], [name] ([id number]) ?Visit Report for 04/12/2021 ?Arrival Information Details ?Patient Name: Date of Service: ?[name], [name] 04/12/2021 8:15 A M ?Medical Record Number: [id number] ?Patient Account Number: [id number] ?Date of Birth/Sex: Treating RN: ?[birth year] (76 y.o. F) [name], Vance ?Primary Care Ryleah Miramontes: Inda Coke Other Clinician: ?Referring Amanda Pote: ?Treating Glynis Hunsucker/Extender: Fredirick Maudlin ?Inda Coke ?Weeks in Treatment: 9 ?Visit Information History Since Last Visit ?Added or deleted any medications: No ?Patient Arrived: Ambulatory ?Any new allergies or adverse reactions: No ?Arrival Time: 08:12 ?Had a fall or experienced change in No ?Accompanied By: self ?activities of daily living that may affect ?Transfer Assistance: None ?risk of falls: ?Patient Identification Verified: Yes ?Signs or symptoms of abuse/neglect since last visito No ?Secondary Verification Process Completed: Yes ?Hospitalized since last visit: No ?Patient Requires Transmission-Based Precautions: No ?Implantable device outside of the clinic excluding No ?Patient Has Alerts: No ?cellular tissue based products placed in the center ?since last visit: ?Has Dressing in Place as Prescribed: Yes ?Pain Present Now: No ?Electronic Signature(s) ?Signed: 04/13/2021 9:20:58 AM By: Sandre Kitty ?Entered By: Sandre Kitty on 04/12/2021 08:13:23 ?-------------------------------------------------------------------------------- ?Compression Therapy Details ?Patient Name: Date of Service: ?[name], [name] 04/12/2021 8:15 A M ?Medical Record Number: [id number] ?Patient Account Number: [id number] ?Date of Birth/Sex: Treating RN: ?[birth year] (76 y.o. [name], [name] ?Primary Care Osman Calzadilla: Inda Coke Other Clinician: ?Referring Edin Skarda: ?Treating Leonetta Mcgivern/Extender: Fredirick Maudlin ?Inda Coke ?Weeks in Treatment: 9 ?Compression Therapy Performed for Wound Assessment: Wound #1 Right,Lateral Lower Leg ?Performed By: Clinician  Levan Hurst, RN ?Compression Type: Three Layer ?Post Procedure Diagnosis ?Same as Pre-procedure ?Electronic Signature(s) ?Signed: 04/13/2021 5:30:36 PM By: Levan Hurst RN, BSN ?Entered By: Levan Hurst on 04/12/2021 08:58:09 ?-------------------------------------------------------------------------------- ?Encounter Discharge Information Details ?Patient Name: ?Date of Service: ?[name], [name] 04/12/2021 8:15 A M ?Medical Record Number: [id number] ?Patient Account Number: [id number] ?Date of Birth/Sex: ?Treating RN: ?1945/04/13 (76 y.o. [name], [name] ?Primary Care Dimas Scheck: Inda Coke ?Other Clinician: ?Referring Zakiah Gauthreaux: ?Treating Cashis Rill/Extender: Fredirick Maudlin ?Inda Coke ?Weeks in Treatment: 9 ?Encounter Discharge Information Items Post Procedure Vitals ?Discharge Condition: Stable ?Temperature (F): 97.8 ?Ambulatory Status: Ambulatory ?Pulse (bpm): 71 ?Discharge Destination: Home ?Respiratory Rate (breaths/min): 18 ?Transportation: Private Auto ?Blood Pressure (mmHg): 128/86 ?Accompanied By: alone ?Schedule Follow-up Appointment: Yes ?Clinical Summary of Care: Patient Declined ?Electronic Signature(s) ?Signed: 04/13/2021 5:30:36 PM By: Levan Hurst RN, BSN ?Entered By: Levan Hurst on 04/12/2021 09:21:34 ?-------------------------------------------------------------------------------- ?Lower Extremity Assessment Details ?Patient Name: ?Date of Service: ?[name], [name] 04/12/2021 8:15 A M ?Medical Record Number: [id number] ?Patient Account Number: [id number] ?Date of Birth/Sex: ?Treating RN: ?[birth year] (76 y.o. [name], [name] ?Primary Care Leianne Callins: Inda Coke ?Other Clinician: ?Referring Aria Pickrell: ?Treating Concettina Leth/Extender: Fredirick Maudlin ?Inda Coke ?Weeks in Treatment: 9 ?Edema Assessment ?Assessed: [Left: No] [Right: No] ?Edema: [Left: Ye] [Right: s] ?Calf ?Left: Right: ?Point of Measurement: 33 cm From Medial Instep 37 cm ?Ankle ?Left: Right: ?Point of Measurement: 7  cm From Medial Instep 22.5 cm ?Vascular Assessment ?Pulses: ?Dorsalis Pedis ?Palpable: [Right:Yes] ?Electronic Signature(s) ?Signed: 04/13/2021 5:30:36 PM By: Levan Hurst RN, BSN ?Entered By: Levan Hurst on 04/12/2021 08:53:00 ?-------------------------------------------------------------------------------- ?Multi Wound Chart Details ?Patient Name: ?Date of Service: ?[name], [name] 04/12/2021 8:15 A M ?Medical Record Number: [id number] ?Patient Account Number: [id number] ?Date of Birth/Sex: ?Treating RN: ?14-Sep-1945 (76 y.o. F) [name], Jeneen ?Primary Care Doylene Splinter: Inda Coke ?Other Clinician: ?Referring Hasini Peachey: ?Treating Marzelle Rutten/Extender: Fredirick Maudlin ?Inda Coke ?Weeks in Treatment: 9 ?Vital Signs ?Height(in): 68 ?Pulse(bpm): 71 ?Weight(lbs): 200 ?Blood Pressure(mmHg): 128/86 ?Body Mass Index(BMI): 30.4 ?Temperature(??F): 97.8 ?Respiratory Rate(breaths/min): 18 ?  Photos: [N/A:N/A] ?Right, Lateral Lower Leg N/A N/A ?Wound Location: ?Trauma N/A N/A ?Wounding Event: ?Venous Leg Ulcer N/A N/A ?Primary Etiology: ?Osteoarthritis N/A N/A ?Comorbid History: ?12/15/2020 N/A N/A ?Date Acquired: ?78 N/A N/A ?Weeks of Treatment: ?Open N/A N/A ?Wound Status: ?No N/A N/A ?Wound Recurrence: ?0.9x0.4x0.5 N/A N/A ?Measurements L x W x D (cm) ?0.283 N/A N/A ?A (cm?) : ?rea ?0.141 N/A N/A ?Volume (cm?) : ?85.60% N/A N/A ?% Reduction in A rea: ?96.40% N/A N/A ?% Reduction in Volume: ?12 ?Position 1 (o'clock): ?1.2 ?Maximum Distance 1 (cm): ?Yes N/A N/A ?Tunneling: ?Full Thickness With Exposed Support N/A N/A ?Classification: ?Structures ?Medium N/A N/A ?Exudate A mount: ?Serosanguineous N/A N/A ?Exudate Type: ?red, brown N/A N/A ?Exudate Color: ?Well defined, not attached N/A N/A ?Wound Margin: ?Large (67-100%) N/A N/A ?Granulation A mount: ?Red, Pink N/A N/A ?Granulation Quality: ?Small (1-33%) N/A N/A ?Necrotic A mount: ?Fat Layer (Subcutaneous Tissue): Yes N/A N/A ?Exposed Structures: ?Fascia: No ?Tendon:  No ?Muscle: No ?Joint: No ?Bone: No ?Small (1-33%) N/A N/A ?Epithelialization: ?Debridement - Selective/Open Wound N/A N/A ?Debridement: ?Pre-procedure Verification/Time Out 08:55 N/A N/A ?Taken: ?[facility] N/A N/A ?Tissue Debrided: ?Non-Viable Tissue N/A N/A ?Level: ?0.36 N/A N/A ?Debridement A (sq cm): ?rea ?Curette N/A N/A ?Instrument: ?Minimum N/A N/A ?Bleeding: ?Pressure N/A N/A ?Hemostasis A chieved: ?0 N/A N/A ?Procedural Pain: ?0 N/A N/A ?Post Procedural Pain: ?Procedure was tolerated well N/A N/A ?Debridement Treatment Response: ?0.9x0.4x0.5 N/A N/A ?Post Debridement Measurements L x ?W x D (cm) ?0.141 N/A N/A ?Post Debridement Volume: (cm?) ?Compression Therapy N/A N/A ?Procedures Performed: ?Debridement ?Treatment Notes ?Wound #1 (Lower Leg) Wound Laterality: Right, Lateral ?Cleanser ?Soap and Water ?Discharge Instruction: May shower and wash wound with dial antibacterial soap and water prior to dressing change. ?Wound Cleanser ?Discharge Instruction: Cleanse the wound with wound cleanser prior to applying a clean dressing using gauze sponges, not tissue or cotton balls. ?Peri-Wound Care ?Zinc Oxide Ointment 30g tube ?Discharge Instruction: Apply Zinc Oxide to periwound with each dressing change ?Sween Lotion (Moisturizing lotion) ?Discharge Instruction: Apply moisturizing lotion as directed ?Topical ?Primary Dressing ?Promogran Prisma Matrix, 4.34 (sq in) (silver collagen) ?Discharge Instruction: Moisten collagen with saline or hydrogel ?Secondary Dressing ?Woven Gauze Sponge, Non-Sterile 4x4 in ?Discharge Instruction: Apply over primary dressing as directed. ?Secured With ?Compression Wrap ?ThreePress (3 layer compression wrap) ?Discharge Instruction: Apply three layer compression as directed. ?Compression Stockings ?Add-Ons ?Electronic Signature(s) ?Signed: 04/12/2021 9:46:24 AM By: Fredirick Maudlin MD FACS ?Signed: 04/12/2021 6:07:33 PM By: Baruch Gouty RN, BSN ?Entered By: Fredirick Maudlin on  04/12/2021 09:46:24 ?-------------------------------------------------------------------------------- ?Multi-Disciplinary Care Plan Details ?Patient Name: ?Date of Service: ?[name], [name] 04/12/2021 8:15 A M ?Medical

## 2021-04-13 NOTE — Progress Notes (Signed)
Citro, Sanyah ([id number]) ?Visit Report for 04/12/2021 ?Chief Complaint Document Details ?Patient Name: Date of Service: ?[name], [name] 04/12/2021 8:15 A M ?Medical Record Number: [id number] ?Patient Account Number: [id number] ?Date of Birth/Sex: Treating RN: ?April 08, 1945 (76 y.o. F) [name] ?Primary Care Provider: Inda [name] Other Clinician: ?Referring Provider: ?Treating Provider/Extender: [name] ?[name] ?Weeks in Treatment: 9 ?Information Obtained from: Patient ?Chief Complaint ?02/08/2021; patient is here for review of a wound on the right lateral lower leg which was initially traumatic ?Electronic Signature(s) ?Signed: 04/12/2021 9:46:32 AM By: [name] Maudlin MD FACS ?Entered By: [name] on 04/12/2021 09:46:32 ?-------------------------------------------------------------------------------- ?Debridement Details ?Patient Name: Date of Service: ?[name], [name] 04/12/2021 8:15 A M ?Medical Record Number: [id number] ?Patient Account Number: [id number] ?Date of Birth/Sex: Treating RN: ?1946/01/10 (76 y.o. [name] ?Primary Care Provider: Inda [name] Other Clinician: ?Referring Provider: ?Treating Provider/Extender: [name] ?[name] ?Weeks in Treatment: 9 ?Debridement Performed for Assessment: Wound #1 Right,Lateral Lower Leg ?Performed By: Physician [name] Maudlin, MD ?Debridement Type: Debridement ?Severity of Tissue Pre Debridement: Fat layer exposed ?Level of Consciousness (Pre-procedure): Awake and Alert ?Pre-procedure Verification/Time Out Yes - 08:55 ?Taken: ?Start Time: 08:55 ?T Area Debrided (L x [name]): ?otal 0.9 (cm) x 0.4 (cm) = 0.36 (cm?) ?Tissue and other material debrided: Non-Viable, [place], [place] ?Level: Non-Viable Tissue ?Debridement Description: Selective/Open Wound ?Instrument: Curette ?Bleeding: Minimum ?Hemostasis Achieved: Pressure ?End Time: 08:57 ?Procedural Pain: 0 ?Post Procedural Pain: 0 ?Response to Treatment: Procedure was  tolerated well ?Level of Consciousness (Post- Awake and Alert ?procedure): ?Post Debridement Measurements of Total Wound ?Length: (cm) 0.9 ?Width: (cm) 0.4 ?Depth: (cm) 0.5 ?Volume: (cm?) 0.141 ?Character of Wound/Ulcer Post Debridement: Improved ?Severity of Tissue Post Debridement: Fat layer exposed ?Post Procedure Diagnosis ?Same as Pre-procedure ?Electronic Signature(s) ?Signed: 04/12/2021 12:03:30 PM By: [name] Maudlin MD FACS ?Signed: 04/13/2021 5:30:36 PM By: [name] Hurst RN, BSN ?Entered By: [name] on 04/12/2021 08:57:58 ?-------------------------------------------------------------------------------- ?HPI Details ?Patient Name: Date of Service: ?[name], [name] 04/12/2021 8:15 A M ?Medical Record Number: [id number] ?Patient Account Number: [id number] ?Date of Birth/Sex: Treating RN: ?Jun 15, 1945 (76 y.o. F) [name] ?Primary Care Provider: Inda [name] Other Clinician: ?Referring Provider: ?Treating Provider/Extender: [name] ?[name] ?Weeks in Treatment: 9 ?History of Present Illness ?HPI Description: ADMISSION ?02/08/2021 ?This is a 76 year old woman who suffered a fall in early December while going down stairs.. She was seen in the ER. She was given Rocephin and Keflex. She ?was also followed up in the ER several days later. A DVT rule out was negative. ?On 1/9 she started following up with her primary doctor. Notable for the fact that she was having some bleeding. She was continued on Keflex on 1/12 using an ?Ace wrap. She was referred to our clinic for review. She is not on any anticoagulants although she is on aspirin. She has a history of vein ligation in 2005 when ?she was in [place]. She tells me she did wear compression socks the first week that she had this done and more recently has been using Ace wraps. ?Past medical history includes congestive heart failure, psoriasis, arthritis, right total knee replacement, vein stripping as noted. 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 ?The wound is on her right lateral lower extremity. Her ABIs were 1.1 in our clinic ?2/1; patient was admitted the clinic last week she had a contusion on the right lateral leg. It had fully evacuated for the most part when she came here. No real ?problem with this is not just the physical  depth but the undermining depth superiorly. Culture I did of this last week is still pending but obviously did not grow the ?standard worrisome organisms including staph strep and the common gram-negative's. I did not give her antibiotics. We have been using silver alginate. ?2/8; patient's wound tunneling superiorly on the right leg 4.5 cm which is an improvement. Culture I did last week was negative so showing only diphtheroids. ?She does not seem to need any additional systemic antibiotics. ?At the suggestion of our intake nurse I think the best [name] choice here would be a snap VAC under compression ?2/15; no improvement in the superior tunnel now 1 week with a snap VAC under compression. Our intake nurse points out an additional tunnel at 9:00 which I did ?not identify last time. The surface of the wound looks better. There is some drainage and she is complaining of pain ?2/22; the tunnel superiorly is now down 1 cm but not the tunnel at 9:00 area this is a probing wound going superiorly. There is no superficial aspect of this. We ?are using a snap back under compression. 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 I don't think this had any relevance as it was only "rare" ?03/15/2021: The tunneling continues to improve. Overall, the wound dimensions are roughly the same. Based upon the coag negative staph culture, [name] ?added mupirocin. She continues in a snap VAC with 3 layer compression. ?03/22/2021: Continued improvement in the depth of the tunneling. Last night, she says that the snap cartridge was full and she experienced some pain.  Overall ?wound dimensions appear about the same otherwise. ?03/29/2021: The depth of the tunneling has come down to 1.4 cm. She has not had any further issues with the snap VAC cartridge filling and causing her pain. ?The outer wound dimensions are roughly the same, but the periwound skin is a little bit macerated. ?04/05/2021: The depth of the tunneling continues to decrease. No issues with VAC function. Periwound skin looks better. ?04/12/2021: There has been no significant change in the wound over the last week. There has been almost no output into the VAC cartridge. No concern for ?infection. ?Electronic Signature(s) ?Signed: 04/12/2021 9:47:10 AM By: [name] Maudlin MD FACS ?Entered By: [name] on 04/12/2021 09:47:10 ?-------------------------------------------------------------------------------- ?Physical Exam Details ?Patient Name: Date of Service: ?[name], [name] 04/12/2021 8:15 A M ?Medical Record Number: [id number] ?Patient Account Number: [id number] ?Date of Birth/Sex: Treating RN: ?1945/03/30 (76 y.o. F) [name], Kanon ?Primary Care Provider: Inda [name] Other Clinician: ?Referring Provider: ?Treating Provider/Extender: [name] ?[name] ?Weeks in Treatment: 9 ?Constitutional ?. . . . No acute distress. ?Respiratory ?Normal work of breathing on room air. ?Notes ?04/12/2021: Wound examthere has been no significant change to the wound over the past week. Edema control is good and there is no concern for infection. ?The granulation tissue visible in the wound bed appears healthy. No visible slough. ?Electronic Signature(s) ?Signed: 04/12/2021 9:48:24 AM By: [name] Maudlin MD FACS ?Entered By: [name] on 04/12/2021 09:48:24 ?-------------------------------------------------------------------------------- ?Physician Orders Details ?Patient Name: Date of Service: ?NIYLA, ZAMOR 04/12/2021 8:15 A M ?Medical Record Number: [id number] ?Patient Account Number: [id number] ?Date of  Birth/Sex: Treating RN: ?September 26, 1945 (76 y.o. [name] ?Primary Care Provider: Inda [name] Other Clinician: ?Referring Provider: ?Treating Provider/Extender: [name] ?[name] ?Weeks in [place]

## 2021-04-19 ENCOUNTER — Encounter (HOSPITAL_BASED_OUTPATIENT_CLINIC_OR_DEPARTMENT_OTHER): Payer: Medicare Other | Attending: General Surgery | Admitting: General Surgery

## 2021-04-19 DIAGNOSIS — I87301 Chronic venous hypertension (idiopathic) without complications of right lower extremity: Secondary | ICD-10-CM | POA: Diagnosis not present

## 2021-04-19 DIAGNOSIS — S8011XD Contusion of right lower leg, subsequent encounter: Secondary | ICD-10-CM | POA: Diagnosis present

## 2021-04-19 DIAGNOSIS — L97818 Non-pressure chronic ulcer of other part of right lower leg with other specified severity: Secondary | ICD-10-CM | POA: Insufficient documentation

## 2021-04-19 DIAGNOSIS — M1990 Unspecified osteoarthritis, unspecified site: Secondary | ICD-10-CM | POA: Diagnosis not present

## 2021-04-19 DIAGNOSIS — Z96651 Presence of right artificial knee joint: Secondary | ICD-10-CM | POA: Insufficient documentation

## 2021-04-19 DIAGNOSIS — X58XXXD Exposure to other specified factors, subsequent encounter: Secondary | ICD-10-CM | POA: Diagnosis not present

## 2021-04-19 NOTE — Progress Notes (Addendum)
Gastelum, Austine ([id number]) ?Visit Report for 04/19/2021 ?Chief Complaint Document Details ?Patient Name: Date of Service: ?[name], [name] 04/19/2021 8:15 A M ?Medical Record Number: [id number] ?Patient Account Number: [id number] ?Date of Birth/Sex: Treating RN: ?27-Aug-1945 (76 y.o. F) [name], Alsie ?Primary Care Provider: Inda Coke Other Clinician: ?Referring Provider: ?Treating Provider/Extender: Fredirick Maudlin ?Inda Coke ?Weeks in Treatment: 10 ?Information Obtained from: Patient ?Chief Complaint ?02/08/2021; patient is here for review of a wound on the right lateral lower leg which was initially traumatic ?Electronic Signature(s) ?Signed: 04/19/2021 9:25:11 AM By: Fredirick Maudlin MD FACS ?Entered By: Fredirick Maudlin on 04/19/2021 09:25:11 ?-------------------------------------------------------------------------------- ?HPI Details ?Patient Name: Date of Service: ?[name], [name] 04/19/2021 8:15 A M ?Medical Record Number: [id number] ?Patient Account Number: [id number] ?Date of Birth/Sex: Treating RN: ?[birth year] (76 y.o. F) [name], Sandar ?Primary Care Provider: Inda Coke Other Clinician: ?Referring Provider: ?Treating Provider/Extender: Fredirick Maudlin ?Inda Coke ?Weeks in Treatment: 10 ?History of Present Illness ?HPI Description: ADMISSION ?02/08/2021 ?This is a 76 year old woman who suffered a fall in early December while going down stairs.. She was seen in the ER. She was given Rocephin and Keflex. She ?was also followed up in the ER several days later. A DVT rule out was negative. ?On 1/9 she started following up with her primary doctor. Notable for the fact that she was having some bleeding. She was continued on Keflex on 1/12 using an ?Ace wrap. She was referred to our clinic for review. She is not on any anticoagulants although she is on aspirin. She has a history of vein ligation in 2005 when ?she was in [place]. She tells me she did wear compression socks the first week that she  had this done and more recently has been using Ace wraps. ?Past medical history includes congestive heart failure, psoriasis, arthritis, right total knee replacement, vein stripping as noted. 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 ?The wound is on her right lateral lower extremity. Her ABIs were 1.1 in our clinic ?2/1; patient was admitted the clinic last week she had a contusion on the right lateral leg. It had fully evacuated for the most part when she came here. No real ?problem with this is not just the physical depth but the undermining depth superiorly. Culture I did of this last week is still pending but obviously did not grow the ?standard worrisome organisms including staph strep and the common gram-negative's. I did not give her antibiotics. We have been using silver alginate. ?2/8; patient's wound tunneling superiorly on the right leg 4.5 cm which is an improvement. Culture I did last week was negative so showing only diphtheroids. ?She does not seem to need any additional systemic antibiotics. ?At the suggestion of our intake nurse I think the best [facility] choice here would be a snap VAC under compression ?2/15; no improvement in the superior tunnel now 1 week with a snap VAC under compression. Our intake nurse points out an additional tunnel at 9:00 which I did ?not identify last time. The surface of the wound looks better. There is some drainage and she is complaining of pain ?2/22; the tunnel superiorly is now down 1 cm but not the tunnel at 9:00 area this is a probing wound going superiorly. There is no superficial aspect of this. We ?are using a snap back under compression. 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 I don't think this had any relevance as it was only "rare" ?03/15/2021: The tunneling continues to improve.  Overall, the wound dimensions are roughly the same. Based upon the coag negative staph culture, Dr. Dellia Nims ?added mupirocin.  She continues in a snap VAC with 3 layer compression. ?03/22/2021: Continued improvement in the depth of the tunneling. Last night, she says that the snap cartridge was full and she experienced some pain. Overall ?wound dimensions appear about the same otherwise. ?03/29/2021: The depth of the tunneling has come down to 1.4 cm. She has not had any further issues with the snap VAC cartridge filling and causing her pain. ?The outer wound dimensions are roughly the same, but the periwound skin is a little bit macerated. ?04/05/2021: The depth of the tunneling continues to decrease. No issues with VAC function. Periwound skin looks better. ?04/12/2021: There has been no significant change in the wound over the last week. There has been almost no output into the VAC cartridge. No concern for ?infection. ?04/19/2021: The wound VAC was discontinued last week. We have been using silver collagen under 3 layer compression. Today, the wound orifice is quite a bit ?smaller. There is still tunneling to a depth of 1.1 cm. Minimal serous drainage present. ?Electronic Signature(s) ?Signed: 04/19/2021 9:25:52 AM By: Fredirick Maudlin MD FACS ?Entered By: Fredirick Maudlin on 04/19/2021 09:25:51 ?-------------------------------------------------------------------------------- ?Physical Exam Details ?Patient Name: Date of Service: ?[name], [name] 04/19/2021 8:15 A M ?Medical Record Number: [id number] ?Patient Account Number: [id number] ?Date of Birth/Sex: Treating RN: ?25-Jun-1945 (76 y.o. F) [name], Macaiah ?Primary Care Provider: Inda Coke Other Clinician: ?Referring Provider: ?Treating Provider/Extender: Fredirick Maudlin ?Inda Coke ?Weeks in Treatment: 10 ?Constitutional ?. . . . No acute distress. ?Respiratory ?Normal work of breathing on room air. ?Notes ?04/19/2021: The orifice of the wound has contracted and is smaller. The 12:00 tunnel is about 1.1 cm, which is smaller. Minimal serous drainage present. ?Electronic  Signature(s) ?Signed: 04/19/2021 9:26:54 AM By: Fredirick Maudlin MD FACS ?Entered By: Fredirick Maudlin on 04/19/2021 09:26:54 ?-------------------------------------------------------------------------------- ?Physician Orders Details ?Patient Name: Date of Service: ?[name], [name] 04/19/2021 8:15 A M ?Medical Record Number: [id number] ?Patient Account Number: [id number] ?Date of Birth/Sex: Treating RN: ?01-Oct-1945 (76 y.o. 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 - Dr Celine Ahr - Room 1 ?Return A ?Bathing/ Shower/ Hygiene ?May shower with protection but do not get wound dressing(s) wet. ?Edema Control - Lymphedema / SCD / Other ?Right Lower Extremity ?Elevate legs to the level of the heart or above for 30 minutes daily and/or when sitting, a frequency of: - throughout the day ?void standing for long periods of time. - Elevate legs throughout the day ?A ?Exercise regularly ?Wound Treatment ?Wound #1 - Lower Leg Wound Laterality: Right, Lateral ?Cleanser: Soap and Water 1 x Per Week ?Discharge Instructions: May shower and wash wound with dial antibacterial soap and water prior to dressing change. ?Cleanser: Wound Cleanser 1 x Per Week ?Discharge Instructions: Cleanse the wound with wound cleanser prior to applying a clean dressing using gauze sponges, not tissue or cotton balls. ?Peri-Wound Care: Sween Lotion (Moisturizing lotion) 1 x Per Week ?Discharge Instructions: Apply moisturizing lotion as directed ?Prim  Dressing: Promogran Prisma Matrix, 4.34 (sq in) (silver collagen) 1  x Per Week ?ary ?Discharge Instructions: Moisten collagen with saline or hydrogel ?Secondary Dressing: Woven Gauze Sponge, Non-Sterile 4x4 in 1 x Per Earlean Shawl

## 2021-04-20 NOTE — Progress Notes (Signed)
Perham, Tamekia ([id number]) ?Visit Report for 04/19/2021 ?Arrival Information Details ?Patient Name: Date of Service: ?[name], [name] 04/19/2021 8:15 A M ?Medical Record Number: [id number] ?Patient Account Number: [id number] ?Date of Birth/Sex: Treating RN: ?01/07/1946 (76 y.o. F) [name], Shaelee ?Primary Care Kenetha Cozza: [name] Other Clinician: ?Referring Lizania Bouchard: ?Treating Penda Venturi/Extender: Duanne Guess ?[name] ?Weeks in Treatment: 10 ?Visit Information History Since Last Visit ?Added or deleted any medications: No ?Patient Arrived: Ambulatory ?Any new allergies or adverse reactions: No ?Arrival Time: 08:29 ?Had a fall or experienced change in No ?Accompanied By: self ?activities of daily living that may affect ?Transfer Assistance: None ?risk of falls: ?Patient Identification Verified: Yes ?Signs or symptoms of abuse/neglect since last visito No ?Secondary Verification Process Completed: Yes ?Hospitalized since last visit: No ?Patient Requires Transmission-Based Precautions: No ?Implantable device outside of the clinic excluding No ?Patient Has Alerts: No ?cellular tissue based products placed in the center ?since last visit: ?Has Dressing in Place as Prescribed: Yes ?Pain Present Now: Yes ?Electronic Signature(s) ?Signed: 04/20/2021 4:07:00 PM By: Karl Ito ?Entered By: Karl Ito on 04/19/2021 08:30:08 ?-------------------------------------------------------------------------------- ?Compression Therapy Details ?Patient Name: Date of Service: ?[name], [name] 04/19/2021 8:15 A M ?Medical Record Number: [id number] ?Patient Account Number: [id number] ?Date of Birth/Sex: Treating RN: ?May 15, 1945 (76 y.o. F) [name], Akesha ?Primary Care Jannett Schmall: [name] Other Clinician: ?Referring Irelynn Schermerhorn: ?Treating Eriyonna Matsushita/Extender: Duanne Guess ?[name] ?Weeks in Treatment: 10 ?Compression Therapy Performed for Wound Assessment: Wound #1 Right,Lateral Lower Leg ?Performed By: Clinician  Zenaida Deed, RN ?Compression Type: Three Layer ?Post Procedure Diagnosis ?Same as Pre-procedure ?Electronic Signature(s) ?Signed: 04/19/2021 3:41:39 PM By: Samuella Bruin ?Entered By: Samuella Bruin on 04/19/2021 09:22:23 ?-------------------------------------------------------------------------------- ?Encounter Discharge Information Details ?Patient Name: ?Date of Service: ?[name], [name] 04/19/2021 8:15 A M ?Medical Record Number: [id number] ?Patient Account Number: [id number] ?Date of Birth/Sex: ?Treating RN: ?[birth year] (76 y.o. F) [name], Shalika ?Primary Care Lennon Richins: [name] ?Other Clinician: ?Referring Shakari Qazi: ?Treating Rosebud Koenen/Extender: Duanne Guess ?[name] ?Weeks in Treatment: 10 ?Encounter Discharge Information Items ?Discharge Condition: Stable ?Ambulatory Status: Ambulatory ?Discharge Destination: Home ?Transportation: Private Auto ?Accompanied By: self ?Schedule Follow-up Appointment: Yes ?Clinical Summary of Care: Patient Declined ?Electronic Signature(s) ?Signed: 04/19/2021 3:41:39 PM By: Samuella Bruin ?Entered By: Samuella Bruin on 04/19/2021 09:31:34 ?-------------------------------------------------------------------------------- ?Lower Extremity Assessment Details ?Patient Name: ?Date of Service: ?[name], [name] 04/19/2021 8:15 A M ?Medical Record Number: [id number] ?Patient Account Number: [id number] ?Date of Birth/Sex: ?Treating RN: ?Aug 21, 1945 (76 y.o. F) [name], Albert ?Primary Care Jessia Kief: [name] ?Other Clinician: ?Referring Abed Schar: ?Treating Jaykob Minichiello/Extender: Duanne Guess ?[name] ?Weeks in Treatment: 10 ?Edema Assessment ?Assessed: [Left: No] [Right: No] ?Edema: [Left: Ye] [Right: s] ?Calf ?Left: Right: ?Point of Measurement: 33 cm From Medial Instep 35.8 cm ?Ankle ?Left: Right: ?Point of Measurement: 7 cm From Medial Instep 22.2 cm ?Vascular Assessment ?Pulses: ?Dorsalis Pedis ?Palpable: [Right:Yes] ?Electronic  Signature(s) ?Signed: 04/19/2021 3:41:39 PM By: Samuella Bruin ?Signed: 04/19/2021 6:23:41 PM By: Zenaida Deed RN, BSN ?Entered By: Samuella Bruin on 04/19/2021 09:12:23 ?-------------------------------------------------------------------------------- ?Multi Wound Chart Details ?Patient Name: ?Date of Service: ?[name], [name] 04/19/2021 8:15 A M ?Medical Record Number: [id number] ?Patient Account Number: [id number] ?Date of Birth/Sex: ?Treating RN: ?09-20-1945 (76 y.o. F) [name], Nysha ?Primary Care Ray Glacken: [name] ?Other Clinician: ?Referring Danny Zimny: ?Treating Kharisma Glasner/Extender: Duanne Guess ?[name] ?Weeks in Treatment: 10 ?Vital Signs ?Height(in): 68 ?Pulse(bpm): 84 ?Weight(lbs): 200 ?Blood Pressure(mmHg): 137/80 ?Body Mass Index(BMI): 30.4 ?Temperature(??F): 97.9 ?Respiratory Rate(breaths/min): 18 ?Photos: [N/A:N/A] ?Right, Lateral Lower Leg N/A N/A ?Wound Location: ?Trauma N/A N/A ?Wounding  Event: ?Venous Leg Ulcer N/A N/A ?Primary Etiology: ?Osteoarthritis N/A N/A ?Comorbid History: ?12/15/2020 N/A N/A ?Date Acquired: ?14 N/A N/A ?Weeks of Treatment: ?Open N/A N/A ?Wound Status: ?No N/A N/A ?Wound Recurrence: ?0.3x0.2x0.3 N/A N/A ?Measurements L x W x D (cm) ?0.047 N/A N/A ?A (cm?) : ?rea ?0.014 N/A N/A ?Volume (cm?) : ?97.60% N/A N/A ?% Reduction in A rea: ?99.60% N/A N/A ?% Reduction in Volume: ?12 ?Position 1 (o'clock): ?1.1 ?Maximum Distance 1 (cm): ?Yes N/A N/A ?Tunneling: ?Full Thickness With Exposed Support N/A N/A ?Classification: ?Structures ?Medium N/A N/A ?Exudate Amount: ?Serosanguineous N/A N/A ?Exudate Type: ?red, brown N/A N/A ?Exudate Color: ?Well defined, not attached N/A N/A ?Wound Margin: ?Large (67-100%) N/A N/A ?Granulation Amount: ?Red, Pink N/A N/A ?Granulation Quality: ?Small (1-33%) N/A N/A ?Necrotic Amount: ?Fat Layer (Subcutaneous Tissue): Yes N/A N/A ?Exposed Structures: ?Fascia: No ?Tendon: No ?Muscle: No ?Joint: No ?Bone: No ?Large (67-100%) N/A  N/A ?Epithelialization: ?Compression Therapy N/A N/A ?Procedures Performed: ?Treatment Notes ?Electronic Signature(s) ?Signed: 04/19/2021 9:25:03 AM By: Duanne Guess MD FACS ?Signed: 04/19/2021 6:23:41 PM By: Zenaida Deed RN, BSN ?Entered By: Duanne Guess on 04/19/2021 09:25:02 ?-------------------------------------------------------------------------------- ?Multi-Disciplinary Care Plan Details ?Patient Name: ?Date of Service: ?[name], [name] 04/19/2021 8:15 A M ?Medical Record Number: [id number] ?Patient Account Number: [id number] ?Date of Birth/Sex: ?Treating RN: ?[birth year] (76 y.o. F) [name], Tamina ?Primary Care Ishan Sanroman: [name] ?Other Clinician: ?Referring Marchella Hibbard: ?Treating Shaden Lacher/Extender: Duanne Guess ?[name] ?Weeks in Treatment: 10 ?Multidisciplinary Care Plan reviewed with physician ?Active Inactive ?Venous Leg Ulcer ?Nursing Diagnoses: ?Knowledge deficit related to disease process and management ?Potential for venous Insuffiency (use before diagnosis confirmed) ?Goals: ?Patient will maintain optimal edema control ?Date Initiated: 02/22/2021 ?Target Resolution Date: 05/12/2021 ?Goal Status: Active ?Interventions: ?Assess peripheral edema status every visit. ?Compression as ordered ?Provide education on venous insufficiency ?Treatment Activities: ?Therapeutic compression applied : 02/22/2021 ?Notes: ?Wound/Skin Impairment ?Nursing Diagnoses: ?Impaired tissue integrity ?Goals: ?Patient/caregiver will verbalize understanding of skin care regimen ?Date Initiated: 02/08/2021 ?Target Resolution Date: 05/11/2021 ?Goal Status: Active ?Interventions: ?Assess ulceration(s) every visit ?Notes: ?Electronic Signature(s) ?Signed: 04/19/2021 3:41:39 PM By: Samuella Bruin ?Signed: 04/19/2021 6:23:41 PM By: Zenaida Deed RN, BSN ?Entered By: Samuella Bruin on 04/19/2021 09:25:05 ?-------------------------------------------------------------------------------- ?Pain Assessment Details ?Patient  Name: ?Date of Service: ?[name], [name] 04/19/2021 8:15 A M ?Medical Record Number: [id number] ?Patient Account Number: [id number] ?Date of Birth/Sex: ?Treating RN: ?[birth year] (76 y.o. F) [name], Russell ?Primary Care Chayden Garrelts:

## 2021-04-26 ENCOUNTER — Encounter (HOSPITAL_BASED_OUTPATIENT_CLINIC_OR_DEPARTMENT_OTHER): Payer: Medicare Other | Admitting: General Surgery

## 2021-04-26 DIAGNOSIS — S8011XD Contusion of right lower leg, subsequent encounter: Secondary | ICD-10-CM | POA: Diagnosis not present

## 2021-04-26 NOTE — Progress Notes (Signed)
[name], [name] ([id number]) ?Visit Report for 04/26/2021 ?Arrival Information Details ?Patient Name: Date of Service: ?[name] [name], [name] [name] 04/26/2021 8:15 A M ?Medical Record Number: [id number] ?Patient Account Number: [id number] ?Date of Birth/Sex: Treating RN: ?[name] (76 y.o. F) [name], [name] ?Primary Care Aviya Jarvie: [name] Other Clinician: ?Referring Amber Williard: ?Treating Fynn Adel/Extender: Duanne Guess ?[name] ?Weeks in Treatment: 11 ?Visit Information History Since Last Visit ?Added or deleted any medications: No ?Patient Arrived: Ambulatory ?Any new allergies or adverse reactions: No ?Arrival Time: 08:32 ?Had a fall or experienced change in No ?Accompanied By: self ?activities of daily living that [name] affect ?Transfer Assistance: None ?risk of falls: ?Patient Identification Verified: Yes ?Signs or symptoms of abuse/neglect since last visito No ?Secondary Verification Process Completed: Yes ?Hospitalized since last visit: No ?Patient Requires Transmission-Based Precautions: No ?Implantable device outside of the clinic excluding No ?Patient Has Alerts: No ?cellular tissue based products placed in the center ?since last visit: ?Has Dressing in Place as Prescribed: Yes ?Pain Present Now: No ?Electronic Signature(s) ?Signed: 04/26/2021 9:44:03 AM By: Karl Ito ?Entered By: Karl Ito on 04/26/2021 08:32:34 ?-------------------------------------------------------------------------------- ?Compression Therapy Details ?Patient Name: Date of Service: ?[name] [name], [name] [name] 04/26/2021 8:15 A M ?Medical Record Number: [id number] ?Patient Account Number: [id number] ?Date of Birth/Sex: Treating RN: ?[birth year] (76 y.o. F) [name], [name] ?Primary Care Mikhael Hendriks: [name] Other Clinician: ?Referring Hagen Bohorquez: ?Treating Alencia Gordon/Extender: Duanne Guess ?[name] ?Weeks in Treatment: 11 ?Compression Therapy Performed for Wound Assessment: Wound #1 Right,Lateral Lower Leg ?Performed By:  Clinician Zenaida Deed, RN ?Compression Type: Three Layer ?Post Procedure Diagnosis ?Same as Pre-procedure ?Electronic Signature(s) ?Signed: 04/26/2021 6:08:52 PM By: Zenaida Deed RN, BSN ?Entered By: Zenaida Deed on 04/26/2021 08:53:44 ?-------------------------------------------------------------------------------- ?Encounter Discharge Information Details ?Patient Name: ?Date of Service: ?[name] [name], [name] [name] 04/26/2021 8:15 A M ?Medical Record Number: [id number] ?Patient Account Number: [id number] ?Date of Birth/Sex: ?Treating RN: ?[name] [name], [name] (76 y.o. F) [name], [name] ?Primary Care Sumit Branham: [name] ?Other Clinician: ?Referring Ronson Hagins: ?Treating Ambriel Gorelick/Extender: Duanne Guess ?[name] ?Weeks in Treatment: 11 ?Encounter Discharge Information Items ?Discharge Condition: Stable ?Ambulatory Status: Ambulatory ?Discharge Destination: Home ?Transportation: Private Auto ?Accompanied By: self ?Schedule Follow-up Appointment: Yes ?Clinical Summary of Care: Patient Declined ?Electronic Signature(s) ?Signed: 04/26/2021 6:08:52 PM By: Zenaida Deed RN, BSN ?Entered By: Zenaida Deed on 04/26/2021 09:09:50 ?-------------------------------------------------------------------------------- ?Lower Extremity Assessment Details ?Patient Name: ?Date of Service: ?[name] [name], [name] [name] 04/26/2021 8:15 A M ?Medical Record Number: [id number] ?Patient Account Number: [id number] ?Date of Birth/Sex: ?Treating RN: ?[name] [name] (76 y.o. F) [name], [name] ?Primary Care Zamara Cozad: [name] ?Other Clinician: ?Referring Rionna Feltes: ?Treating Haylen Shelnutt/Extender: Duanne Guess ?[name] ?Weeks in Treatment: 11 ?Edema Assessment ?Assessed: [Left: No] [Right: No] ?Edema: [Left: Ye] [Right: s] ?Calf ?Left: Right: ?Point of Measurement: 33 cm From Medial Instep 33.2 cm ?Ankle ?Left: Right: ?Point of Measurement: 7 cm From Medial Instep 22.5 cm ?Vascular Assessment ?Pulses: ?Dorsalis Pedis ?Palpable:  [Right:Yes] ?Electronic Signature(s) ?Signed: 04/26/2021 6:08:52 PM By: Zenaida Deed RN, BSN ?Entered By: Zenaida Deed on 04/26/2021 08:42:47 ?-------------------------------------------------------------------------------- ?Multi Wound Chart Details ?Patient Name: ?Date of Service: ?[name] [name], [name] [name] 04/26/2021 8:15 A M ?Medical Record Number: [id number] ?Patient Account Number: [id number] ?Date of Birth/Sex: ?Treating RN: ?[name] [name] (77 y.o. F) [name], [name] ?Primary Care Kaedyn Polivka: [name] ?Other Clinician: ?Referring Vitaliy Eisenhour: ?Treating Julian Askin/Extender: Duanne Guess ?[name] ?Weeks in Treatment: 11 ?Vital Signs ?Height(in): 68 ?Pulse(bpm): 78 ?Weight(lbs): 200 ?Blood Pressure(mmHg): 127/83 ?Body Mass Index(BMI): [name].4 ?Temperature(??F): 97.8 ?Respiratory Rate(breaths/min): 18 ?Photos: [N/A:N/A] ?Right, Lateral Lower Leg N/A N/A ?Wound Location: ?Trauma N/A N/A ?Wounding Event: ?Venous Leg  Ulcer N/A N/A ?Primary Etiology: ?Osteoarthritis N/A N/A ?Comorbid History: ?12/15/2020 N/A N/A ?Date Acquired: ?59 N/A N/A ?Weeks of Treatment: ?Open N/A N/A ?Wound Status: ?No N/A N/A ?Wound Recurrence: ?0.3x0.2x0.3 N/A N/A ?Measurements L x W x D (cm) ?0.047 N/A N/A ?A (cm?) : ?rea ?0.014 N/A N/A ?Volume (cm?) : ?97.60% N/A N/A ?% Reduction in A rea: ?99.60% N/A N/A ?% Reduction in Volume: ?12 ?Position 1 (o'clock): ?1 ?Maximum Distance 1 (cm): ?Yes N/A N/A ?Tunneling: ?Full Thickness With Exposed Support N/A N/A ?Classification: ?Structures ?Medium N/A N/A ?Exudate Amount: ?Serosanguineous N/A N/A ?Exudate Type: ?red, brown N/A N/A ?Exudate Color: ?Well defined, not attached N/A N/A ?Wound Margin: ?Small (1-33%) N/A N/A ?Granulation Amount: ?Red N/A N/A ?Granulation Quality: ?None Present (0%) N/A N/A ?Necrotic Amount: ?Fat Layer (Subcutaneous Tissue): Yes N/A N/A ?Exposed Structures: ?Fascia: No ?Tendon: No ?Muscle: No ?Joint: No ?Bone: No ?Large (67-100%) N/A N/A ?Epithelialization: ?Compression  Therapy N/A N/A ?Procedures Performed: ?Treatment Notes ?Wound #1 (Lower Leg) Wound Laterality: Right, Lateral ?Cleanser ?Soap and Water ?Discharge Instruction: [name] shower and wash wound with dial antibacterial soap and water prior to dressing change. ?Wound Cleanser ?Discharge Instruction: Cleanse the wound with wound cleanser prior to applying a clean dressing using gauze sponges, not tissue or cotton balls. ?Peri-Wound Care ?Sween Lotion (Moisturizing lotion) ?Discharge Instruction: Apply moisturizing lotion as directed ?Topical ?Primary Dressing ?Iodoform packing strip 1/4 (in) ?Discharge Instruction: Lightly pack as instructed ?Secondary Dressing ?Woven Gauze Sponge, Non-Sterile 4x4 in ?Discharge Instruction: Apply over primary dressing as directed. ?Secured With ?Compression Wrap ?ThreePress (3 layer compression wrap) ?Discharge Instruction: Apply three layer compression as directed. ?Compression Stockings ?Add-Ons ?Electronic Signature(s) ?Signed: 04/26/2021 9:19:[name] AM By: Duanne Guess MD FACS ?Signed: 04/26/2021 6:08:52 PM By: Zenaida Deed RN, BSN ?Entered By: Duanne Guess on 04/26/2021 09:19:[name] ?-------------------------------------------------------------------------------- ?Multi-Disciplinary Care Plan Details ?Patient Name: ?Date of Service: ?[name] [name], [name] [name] 04/26/2021 8:15 A M ?Medical Record Number: [id number] ?Patient Account Number: [id number] ?Date of Birth/Sex: ?Treating RN: ?[birth year]Jul-[name] (76 y.o. F) [name], Dawna ?Primary Care Loye Vento: [name] ?Other Clinician: ?Referring Ferry Matthis: ?Treating Jametta Moorehead/Extender: Duanne Guess ?[name] ?Weeks in Treatment: 11 ?Multidisciplinary Care Plan reviewed with physician ?Active Inactive ?Venous Leg Ulcer ?Nursing Diagnoses: ?Knowledge deficit related to disease process and management ?Potential for venous Insuffiency (use before diagnosis confirmed) ?Goals: ?Patient will maintain optimal edema control ?Date Initiated: 02/22/2021 ?Target  Resolution Date: 05/12/2021 ?Goal Status: Active ?Interventions: ?Assess peripheral edema status every visit. ?Compression as ordered ?Provide education on venous insufficiency ?Treatment Activities: ?Therapeutic compression applied :

## 2021-04-26 NOTE — Progress Notes (Signed)
[name], [name] ([id number]) ?Visit Report for 04/26/2021 ?Chief Complaint Document Details ?Patient Name: Date of Service: ?[name], [name] 04/26/2021 8:15 A M ?Medical Record Number: [id number] ?Patient Account Number: [id number] ?Date of Birth/Sex: Treating RN: ?[birth year] (76 y.o. F) [name], [name] ?Primary Care Provider: Inda Coke Other Clinician: ?Referring Provider: ?Treating Provider/Extender: Fredirick [name] ?Inda Coke ?Weeks in Treatment: 11 ?Information Obtained from: Patient ?Chief Complaint ?02/08/2021; patient is here for review of a wound on the right lateral lower leg which was initially traumatic ?Electronic Signature(s) ?Signed: 04/26/2021 9:19:42 AM By: Fredirick Maudlin MD FACS ?Entered By: Fredirick [name] on 04/26/2021 09:19:42 ?-------------------------------------------------------------------------------- ?HPI Details ?Patient Name: Date of Service: ?[name], [name] 04/26/2021 8:15 A M ?Medical Record Number: [id number] ?Patient Account Number: [id number] ?Date of Birth/Sex: Treating RN: ?1945-03-25 (76 y.o. F) [name], [name] ?Primary Care Provider: Inda Coke Other Clinician: ?Referring Provider: ?Treating Provider/Extender: Fredirick [name] ?Inda Coke ?Weeks in Treatment: 11 ?History of Present Illness ?HPI Description: ADMISSION ?02/08/2021 ?This is a 76 year old woman who suffered a fall in early December while going down stairs.. She was seen in the ER. She was given Rocephin and Keflex. She ?was also followed up in the ER several days later. A DVT rule out was negative. ?On 1/9 she started following up with her primary doctor. Notable for the fact that she was having some bleeding. She was continued on Keflex on 1/12 using an ?Ace wrap. She was referred to our clinic for review. She is not on any anticoagulants although she is on aspirin. She has a history of vein ligation in 2005 when ?she was in [place]. She tells me she did wear compression socks the first week that  she had this done and more recently has been using Ace wraps. ?Past medical history includes congestive heart failure, psoriasis, arthritis, right total knee replacement, vein stripping as noted. 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 ?The wound is on her right lateral lower extremity. Her ABIs were 1.1 in our clinic ?2/1; patient was admitted the clinic last week she had a contusion on the right lateral leg. It had fully evacuated for the most part when she came here. No real ?problem with this is not just the physical depth but the undermining depth superiorly. Culture I did of this last week is still pending but obviously did not grow the ?standard worrisome organisms including staph strep and the common gram-negative's. I did not give her antibiotics. We have been using silver alginate. ?2/8; patient's wound tunneling superiorly on the right leg 4.5 cm which is an improvement. Culture I did last week was negative so showing only diphtheroids. ?She does not seem to need any additional systemic antibiotics. ?At the suggestion of our intake nurse I think the best [facility] choice here would be a snap VAC under compression ?2/15; no improvement in the superior tunnel now 1 week with a snap VAC under compression. Our intake nurse points out an additional tunnel at 9:00 which I did ?not identify last time. The surface of the wound looks better. There is some drainage and she is complaining of pain ?2/22; the tunnel superiorly is now down 1 cm but not the tunnel at 9:00 area this is a probing wound going superiorly. There is no superficial aspect of this. We ?are using a snap back under compression. 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 I don't think this had any relevance as it was only "rare" ?03/15/2021: The tunneling continues to improve.  Overall, the wound dimensions are roughly the same. Based upon the coag negative staph culture, Dr. Dellia Nims ?added  mupirocin. She continues in a snap VAC with 3 layer compression. ?03/22/2021: Continued improvement in the depth of the tunneling. Last night, she says that the snap cartridge was full and she experienced some pain. Overall ?wound dimensions appear about the same otherwise. ?03/29/2021: The depth of the tunneling has come down to 1.4 cm. She has not had any further issues with the snap VAC cartridge filling and causing her pain. ?The outer wound dimensions are roughly the same, but the periwound skin is a little bit macerated. ?04/05/2021: The depth of the tunneling continues to decrease. No issues with VAC function. Periwound skin looks better. ?04/12/2021: There has been no significant change in the wound over the last week. There has been almost no output into the VAC cartridge. No concern for ?infection. ?04/19/2021: The wound VAC was discontinued last week. We have been using silver collagen under 3 layer compression. Today, the wound orifice is quite a bit ?smaller. There is still tunneling to a depth of 1.1 cm. Minimal serous drainage present. ?04/26/2021: The wound orifice continues to contract. The tunneling is down by 1 mm. We have been using silver collagen under 3 layer compression. ?Electronic Signature(s) ?Signed: 04/26/2021 9:20:18 AM By: Fredirick Maudlin MD FACS ?Entered By: Fredirick [name] on 04/26/2021 09:20:17 ?-------------------------------------------------------------------------------- ?Physical Exam Details ?Patient Name: Date of Service: ?[name], [name] 04/26/2021 8:15 A M ?Medical Record Number: [id number] ?Patient Account Number: [id number] ?Date of Birth/Sex: Treating RN: ?05-09-1945 (76 y.o. F) [name] ?Primary Care Provider: Inda Coke Other Clinician: ?Referring Provider: ?Treating Provider/Extender: Fredirick [name] ?Inda Coke ?Weeks in Treatment: 11 ?Constitutional ?. . . . No acute distress. ?Respiratory ?Normal work of breathing on room air. ?Notes ?04/26/2021: The wound  orifice continues to contract. The 12:00 tunnel is down by 1 mm to 1 cm. Very little drainage. ?Electronic Signature(s) ?Signed: 04/26/2021 9:21:20 AM By: Fredirick Maudlin MD FACS ?Entered By: Fredirick [name] on 04/26/2021 09:21:20 ?-------------------------------------------------------------------------------- ?Physician Orders Details ?Patient Name: Date of Service: ?[name], [name] 04/26/2021 8:15 A M ?Medical Record Number: [id number] ?Patient Account Number: [id number] ?Date of Birth/Sex: Treating RN: ?1945/08/25 (76 y.o. F) [name], Malvina ?Primary Care Provider: Inda Coke Other Clinician: ?Referring Provider: ?Treating Provider/Extender: Fredirick [name] ?Inda Coke ?Weeks in Treatment: 11 ?Verbal / Phone Orders: No ?Diagnosis Coding ?ICD-10 Coding ?Code Description ?S80.11XD Contusion of right lower leg, subsequent encounter ?L97.818 Non-pressure chronic ulcer of other part of right lower leg with other specified severity ?I87.301 Chronic venous hypertension (idiopathic) without complications of right lower extremity ?Follow-up Appointments ?ppointment in 1 week. - Dr Celine Ahr - Room 1 ?Return A ?Bathing/ Shower/ Hygiene ?May shower with protection but do not get wound dressing(s) wet. ?Edema Control - Lymphedema / SCD / Other ?Right Lower Extremity ?Elevate legs to the level of the heart or above for 30 minutes daily and/or when sitting, a frequency of: - throughout the day ?void standing for long periods of time. - Elevate legs throughout the day ?A ?Exercise regularly ?Wound Treatment ?Wound #1 - Lower Leg Wound Laterality: Right, Lateral ?Cleanser: Soap and Water 1 x Per Week ?Discharge Instructions: May shower and wash wound with dial antibacterial soap and water prior to dressing change. ?Cleanser: Wound Cleanser 1 x Per Week ?Discharge Instructions: Cleanse the wound with wound cleanser prior to applying a clean dressing using gauze sponges, not tissue or cotton balls. ?Peri-Wound Care: Sween  Lotion (Moisturizing lotion) 1 x  Per Week ?Discharge Instructions: Apply moisturizing lotion as directed ?Prim Dressing: Iodoform packing strip 1/4 (in) 1 x Per Week ?ary ?Discharge Instructions: Lightly pack as i

## 2021-05-03 ENCOUNTER — Encounter (HOSPITAL_BASED_OUTPATIENT_CLINIC_OR_DEPARTMENT_OTHER): Payer: Medicare Other | Admitting: General Surgery

## 2021-05-03 NOTE — Progress Notes (Signed)
Magan, Monika ([id number]) ?Visit Report for 05/03/2021 ?Chief Complaint Document Details ?Patient Name: Date of Service: ?[name], [name] 05/03/2021 8:15 A M ?Medical Record Number: [id number] ?Patient Account Number: [id number] ?Date of Birth/Sex: Treating RN: ?1945-03-16 (76 y.o. F) [name], Ninoshka ?Primary Care Provider: [name] Other Clinician: ?Referring Provider: ?Treating Provider/Extender: Duanne Guess ?[name] ?Weeks in Treatment: 12 ?Information Obtained from: Patient ?Chief Complaint ?02/08/2021; patient is here for review of a wound on the right lateral lower leg which was initially traumatic ?Electronic Signature(s) ?Signed: 05/03/2021 9:06:55 AM By: Duanne Guess MD FACS ?Entered By: Duanne Guess on 05/03/2021 09:06:55 ?-------------------------------------------------------------------------------- ?HPI Details ?Patient Name: Date of Service: ?[name], [name] 05/03/2021 8:15 A M ?Medical Record Number: [id number] ?Patient Account Number: [id number] ?Date of Birth/Sex: Treating RN: ?09-16-1945 (76 y.o. F) [name], Nettie ?Primary Care Provider: [name] Other Clinician: ?Referring Provider: ?Treating Provider/Extender: Duanne Guess ?[name] ?Weeks in Treatment: 12 ?History of Present Illness ?HPI Description: ADMISSION ?02/08/2021 ?This is a 76 year old woman who suffered a fall in early December while going down stairs.. She was seen in the ER. She was given Rocephin and Keflex. She ?was also followed up in the ER several days later. A DVT rule out was negative. ?On 1/9 she started following up with her primary doctor. Notable for the fact that she was having some bleeding. She was continued on Keflex on 1/12 using an ?Ace wrap. She was referred to our clinic for review. She is not on any anticoagulants although she is on aspirin. She has a history of vein ligation in 2005 when ?she was in [place]. She tells me she did wear compression socks the first week that  she had this done and more recently has been using Ace wraps. ?Past medical history includes congestive heart failure, psoriasis, arthritis, right total knee replacement, vein stripping as noted. 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 ?The wound is on her right lateral lower extremity. Her ABIs were 1.1 in our clinic ?2/1; patient was admitted the clinic last week she had a contusion on the right lateral leg. It had fully evacuated for the most part when she came here. No real ?problem with this is not just the physical depth but the undermining depth superiorly. Culture I did of this last week is still pending but obviously did not grow the ?standard worrisome organisms including staph strep and the common gram-negative's. I did not give her antibiotics. We have been using silver alginate. ?2/8; patient's wound tunneling superiorly on the right leg 4.5 cm which is an improvement. Culture I did last week was negative so showing only diphtheroids. ?She does not seem to need any additional systemic antibiotics. ?At the suggestion of our intake nurse I think the best [facility] choice here would be a snap VAC under compression ?2/15; no improvement in the superior tunnel now 1 week with a snap VAC under compression. Our intake nurse points out an additional tunnel at 9:00 which I did ?not identify last time. The surface of the wound looks better. There is some drainage and she is complaining of pain ?2/22; the tunnel superiorly is now down 1 cm but not the tunnel at 9:00 area this is a probing wound going superiorly. There is no superficial aspect of this. We ?are using a snap back under compression. 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 I don't think this had any relevance as it was only "rare" ?03/15/2021: The tunneling continues to improve.  Overall, the wound dimensions are roughly the same. Based upon the coag negative staph culture, Dr. Leanord Hawking ?added  mupirocin. She continues in a snap VAC with 3 layer compression. ?03/22/2021: Continued improvement in the depth of the tunneling. Last night, she says that the snap cartridge was full and she experienced some pain. Overall ?wound dimensions appear about the same otherwise. ?03/29/2021: The depth of the tunneling has come down to 1.4 cm. She has not had any further issues with the snap VAC cartridge filling and causing her pain. ?The outer wound dimensions are roughly the same, but the periwound skin is a little bit macerated. ?04/05/2021: The depth of the tunneling continues to decrease. No issues with VAC function. Periwound skin looks better. ?04/12/2021: There has been no significant change in the wound over the last week. There has been almost no output into the VAC cartridge. No concern for ?infection. ?04/19/2021: The wound VAC was discontinued last week. We have been using silver collagen under 3 layer compression. Today, the wound orifice is quite a bit ?smaller. There is still tunneling to a depth of 1.1 cm. Minimal serous drainage present. ?04/26/2021: The wound orifice continues to contract. The tunneling is down by 1 mm. We have been using silver collagen under 3 layer compression. ?05/03/2021: The tunneling continues to decrease. We have been using 1/4 inch iodoform packing strip. No concern for infection. ?Electronic Signature(s) ?Signed: 05/03/2021 9:07:29 AM By: Duanne Guess MD FACS ?Entered By: Duanne Guess on 05/03/2021 09:07:29 ?-------------------------------------------------------------------------------- ?Physical Exam Details ?Patient Name: Date of Service: ?[name], [name] 05/03/2021 8:15 A M ?Medical Record Number: [id number] ?Patient Account Number: [id number] ?Date of Birth/Sex: Treating RN: ?1945-12-21 (76 y.o. F) [name], Denita ?Primary Care Provider: [name] Other Clinician: ?Referring Provider: ?Treating Provider/Extender: Duanne Guess ?[name] ?Weeks in Treatment:  12 ?Constitutional ?. . . . No acute distress. ?Respiratory ?Normal work of breathing on room air. ?Notes ?05/03/2021: The wound orifice is about the same size, but the tunnel continues to decrease in length. Minimal drainage. ?Electronic Signature(s) ?Signed: 05/03/2021 9:08:21 AM By: Duanne Guess MD FACS ?Entered By: Duanne Guess on 05/03/2021 09:08:20 ?-------------------------------------------------------------------------------- ?Physician Orders Details ?Patient Name: Date of Service: ?[name], [name] 05/03/2021 8:15 A M ?Medical Record Number: [id number] ?Patient Account Number: [id number] ?Date of Birth/Sex: Treating RN: ?09-Jun-1945 (76 y.o. F) [name], Vincentina ?Primary Care Provider: [name] Other Clinician: ?Referring Provider: ?Treating Provider/Extender: Duanne Guess ?[name] ?Weeks in Treatment: 12 ?Verbal / Phone Orders: No ?Diagnosis Coding ?ICD-10 Coding ?Code Description ?S80.11XD Contusion of right lower leg, subsequent encounter ?L97.818 Non-pressure chronic ulcer of other part of right lower leg with other specified severity ?I87.301 Chronic venous hypertension (idiopathic) without complications of right lower extremity ?Follow-up Appointments ?ppointment in 1 week. - Dr Lady Gary - Room 1 ?Return A ?Bathing/ Shower/ Hygiene ?May shower with protection but do not get wound dressing(s) wet. ?Edema Control - Lymphedema / SCD / Other ?Right Lower Extremity ?Elevate legs to the level of the heart or above for 30 minutes daily and/or when sitting, a frequency of: - throughout the day ?void standing for long periods of time. - Elevate legs throughout the day ?A ?Exercise regularly ?Wound Treatment ?Wound #1 - Lower Leg Wound Laterality: Right, Lateral ?Cleanser: Soap and Water 1 x Per Week ?Discharge Instructions: May shower and wash wound with dial antibacterial soap and water prior to dressing change. ?Cleanser: Wound Cleanser 1 x Per Week ?Discharge Instructions: Cleanse the wound  with wound cleanser prior to applying a clean  dressing using gauze sponges, not tissue or cotton balls. ?Peri-Wound Care: Sween Lotion (Moisturizing lotion) 1 x Per Week ?Discharge Instructions: Apply moisturizi

## 2021-05-03 NOTE — Progress Notes (Signed)
Joens, Fable ([id number]) ?Visit Report for 05/03/2021 ?Arrival Information Details ?Patient Name: Date of Service: ?[name], [name] 05/03/2021 8:15 A M ?Medical Record Number: [id number] ?Patient Account Number: [id number] ?Date of Birth/Sex: Treating RN: ?01/09/1946 (76 y.o. F) [name], Alayja ?Primary Care Dariona Postma: Jarold MottoWorley, Samantha Other Clinician: ?Referring Raegyn Renda: ?Treating Lleyton Byers/Extender: Duanne Guessannon, Jennifer ?Jarold MottoWorley, Samantha ?Weeks in Treatment: 12 ?Visit Information History Since Last Visit ?Added or deleted any medications: No ?Patient Arrived: Ambulatory ?Any new allergies or adverse reactions: No ?Arrival Time: 08:17 ?Had a fall or experienced change in No ?Accompanied By: self ?activities of daily living that may affect ?Transfer Assistance: None ?risk of falls: ?Patient Identification Verified: Yes ?Signs or symptoms of abuse/neglect since last visito No ?Secondary Verification Process Completed: Yes ?Hospitalized since last visit: No ?Patient Requires Transmission-Based Precautions: No ?Implantable device outside of the clinic excluding No ?Patient Has Alerts: No ?cellular tissue based products placed in the center ?since last visit: ?Has Dressing in Place as Prescribed: Yes ?Pain Present Now: No ?Electronic Signature(s) ?Signed: 05/03/2021 3:16:57 PM By: Karl Itoawkins, Destiny ?Entered By: Karl Itoawkins, Destiny on 05/03/2021 08:18:14 ?-------------------------------------------------------------------------------- ?Compression Therapy Details ?Patient Name: Date of Service: ?[name], [name] 05/03/2021 8:15 A M ?Medical Record Number: [id number] ?Patient Account Number: [id number] ?Date of Birth/Sex: Treating RN: ?12/31/1945 (76 y.o. F) [name], Era ?Primary Care Lakiyah Arntson: Jarold MottoWorley, Samantha Other Clinician: ?Referring Nixie Laube: ?Treating Dardan Shelton/Extender: Duanne Guessannon, Jennifer ?Jarold MottoWorley, Samantha ?Weeks in Treatment: 12 ?Compression Therapy Performed for Wound Assessment: Wound #1 Right,Lateral Lower Leg ?Performed By:  Clinician Zenaida DeedBoehlein, Xinyi, RN ?Compression Type: Three Layer ?Post Procedure Diagnosis ?Same as Pre-procedure ?Electronic Signature(s) ?Signed: 05/03/2021 4:07:39 PM By: Samuella BruinHerrington, Taylor ?Entered By: Samuella BruinHerrington, Taylor on 05/03/2021 08:51:36 ?-------------------------------------------------------------------------------- ?Encounter Discharge Information Details ?Patient Name: ?Date of Service: ?[name], [name] 05/03/2021 8:15 A M ?Medical Record Number: [id number] ?Patient Account Number: [id number] ?Date of Birth/Sex: ?Treating RN: ?03/23/1945 (76 y.o. F) [name], Nohemi ?Primary Care Lilinoe Acklin: Jarold MottoWorley, Samantha ?Other Clinician: ?Referring Ambika Zettlemoyer: ?Treating Lenea Bywater/Extender: Duanne Guessannon, Jennifer ?Jarold MottoWorley, Samantha ?Weeks in Treatment: 12 ?Encounter Discharge Information Items ?Discharge Condition: Stable ?Ambulatory Status: Ambulatory ?Discharge Destination: Home ?Transportation: Private Auto ?Accompanied By: self ?Schedule Follow-up Appointment: Yes ?Clinical Summary of Care: Patient Declined ?Electronic Signature(s) ?Signed: 05/03/2021 4:07:39 PM By: Samuella BruinHerrington, Taylor ?Entered By: Samuella BruinHerrington, Taylor on 05/03/2021 09:06:47 ?-------------------------------------------------------------------------------- ?Lower Extremity Assessment Details ?Patient Name: ?Date of Service: ?[name], [name] 05/03/2021 8:15 A M ?Medical Record Number: [id number] ?Patient Account Number: [id number] ?Date of Birth/Sex: ?Treating RN: ?04/20/1945 (76 y.o. F) [name], Matsue ?Primary Care Alyjah Lovingood: Jarold MottoWorley, Samantha ?Other Clinician: ?Referring Bracen Schum: ?Treating Cranford Blessinger/Extender: Duanne Guessannon, Jennifer ?Jarold MottoWorley, Samantha ?Weeks in Treatment: 12 ?Edema Assessment ?Assessed: [Left: No] [Right: No] ?Edema: [Left: Ye] [Right: s] ?Calf ?Left: Right: ?Point of Measurement: 33 cm From Medial Instep 37 cm ?Ankle ?Left: Right: ?Point of Measurement: 7 cm From Medial Instep 22.1 cm ?Vascular Assessment ?Pulses: ?Dorsalis Pedis ?Palpable: [Right:Yes] ?Electronic  Signature(s) ?Signed: 05/03/2021 4:07:39 PM By: Samuella BruinHerrington, Taylor ?Signed: 05/03/2021 5:06:30 PM By: Zenaida DeedBoehlein, Serria RN, BSN ?Entered By: Samuella BruinHerrington, Taylor on 05/03/2021 08:44:12 ?-------------------------------------------------------------------------------- ?Multi Wound Chart Details ?Patient Name: ?Date of Service: ?[name], [name] 05/03/2021 8:15 A M ?Medical Record Number: [id number] ?Patient Account Number: [id number] ?Date of Birth/Sex: ?Treating RN: ?02/22/1945 (76 y.o. F) [name], Renell ?Primary Care Kanav Kazmierczak: Jarold MottoWorley, Samantha ?Other Clinician: ?Referring Ilian Wessell: ?Treating Dalonda Simoni/Extender: Duanne Guessannon, Jennifer ?Jarold MottoWorley, Samantha ?Weeks in Treatment: 12 ?Vital Signs ?Height(in): 68 ?Pulse(bpm): 86 ?Weight(lbs): 200 ?Blood Pressure(mmHg): 135/83 ?Body Mass Index(BMI): 30.4 ?Temperature(??F): 97.7 ?Respiratory Rate(breaths/min): 18 ?Photos: [N/A:N/A] ?Right, Lateral Lower Leg N/A N/A ?Wound Location: ?Trauma N/A N/A ?Wounding  Event: ?Venous Leg Ulcer N/A N/A ?Primary Etiology: ?Osteoarthritis N/A N/A ?Comorbid History: ?12/15/2020 N/A N/A ?Date Acquired: ?50 N/A N/A ?Weeks of Treatment: ?Open N/A N/A ?Wound Status: ?No N/A N/A ?Wound Recurrence: ?0.6x0.4x0.3 N/A N/A ?Measurements L x W x D (cm) ?0.188 N/A N/A ?A (cm?) : ?rea ?0.057 N/A N/A ?Volume (cm?) : ?90.40% N/A N/A ?% Reduction in A rea: ?98.50% N/A N/A ?% Reduction in Volume: ?12 ?Position 1 (o'clock): ?0.9 ?Maximum Distance 1 (cm): ?Yes N/A N/A ?Tunneling: ?Full Thickness With Exposed Support N/A N/A ?Classification: ?Structures ?Medium N/A N/A ?Exudate Amount: ?Serosanguineous N/A N/A ?Exudate Type: ?red, brown N/A N/A ?Exudate Color: ?Well defined, not attached N/A N/A ?Wound Margin: ?Large (67-100%) N/A N/A ?Granulation Amount: ?Red N/A N/A ?Granulation Quality: ?None Present (0%) N/A N/A ?Necrotic Amount: ?Fat Layer (Subcutaneous Tissue): Yes N/A N/A ?Exposed Structures: ?Fascia: No ?Tendon: No ?Muscle: No ?Joint: No ?Bone: No ?Medium (34-66%) N/A  N/A ?Epithelialization: ?Compression Therapy N/A N/A ?Procedures Performed: ?Treatment Notes ?Wound #1 (Lower Leg) Wound Laterality: Right, Lateral ?Cleanser ?Soap and Water ?Discharge Instruction: May shower and wash wound with dial antibacterial soap and water prior to dressing change. ?Wound Cleanser ?Discharge Instruction: Cleanse the wound with wound cleanser prior to applying a clean dressing using gauze sponges, not tissue or cotton balls. ?Peri-Wound Care ?Sween Lotion (Moisturizing lotion) ?Discharge Instruction: Apply moisturizing lotion as directed ?Topical ?Primary Dressing ?Iodoform packing strip 1/4 (in) ?Discharge Instruction: Lightly pack as instructed ?Secondary Dressing ?Woven Gauze Sponge, Non-Sterile 4x4 in ?Discharge Instruction: Apply over primary dressing as directed. ?Secured With ?Compression Wrap ?ThreePress (3 layer compression wrap) ?Discharge Instruction: Apply three layer compression as directed. ?Compression Stockings ?Add-Ons ?Electronic Signature(s) ?Signed: 05/03/2021 9:06:46 AM By: Fredirick Maudlin MD FACS ?Signed: 05/03/2021 5:06:30 PM By: Baruch Gouty RN, BSN ?Entered By: Fredirick Maudlin on 05/03/2021 09:06:46 ?-------------------------------------------------------------------------------- ?Multi-Disciplinary Care Plan Details ?Patient Name: ?Date of Service: ?[name], [name] 05/03/2021 8:15 A M ?Medical Record Number: [id number] ?Patient Account Number: [id number] ?Date of Birth/Sex: ?Treating RN: ?1945-11-02 (76 y.o. F) [name], Latashia ?Primary Care Camil Wilhelmsen: Inda Coke ?Other Clinician: ?Referring Kaydee Magel: ?Treating Chael Urenda/Extender: Fredirick Maudlin ?Inda Coke ?Weeks in Treatment: 12 ?Multidisciplinary Care Plan reviewed with physician ?Active Inactive ?Venous Leg Ulcer ?Nursing Diagnoses: ?Knowledge deficit related to disease process and management ?Potential for venous Insuffiency (use before diagnosis confirmed) ?Goals: ?Patient will maintain optimal edema  control ?Date Initiated: 02/22/2021 ?Target Resolution Date: 05/12/2021 ?Goal Status: Active ?Interventions: ?Assess peripheral edema status every visit. ?Compression as ordered ?Provide education on venous insufficiency ?Tr

## 2021-05-10 ENCOUNTER — Encounter (HOSPITAL_BASED_OUTPATIENT_CLINIC_OR_DEPARTMENT_OTHER): Payer: Medicare Other | Admitting: General Surgery

## 2021-05-10 NOTE — Progress Notes (Signed)
Frix, Mychele ([id number]) ?Visit Report for 05/10/2021 ?Chief Complaint Document Details ?Patient Name: Date of Service: ?[name], [name] 05/10/2021 8:15 A M ?Medical Record Number: [id number] ?Patient Account Number: [id number] ?Date of Birth/Sex: Treating RN: ?July 10, 1945 (76 y.o. F) [name], Sabriya ?Primary Care Provider: [name] Other Clinician: ?Referring Provider: ?Treating Provider/Extender: Duanne Guess ?[name] ?Weeks in Treatment: 13 ?Information Obtained from: Patient ?Chief Complaint ?02/08/2021; patient is here for review of a wound on the right lateral lower leg which was initially traumatic ?Electronic Signature(s) ?Signed: 05/10/2021 8:58:12 AM By: Duanne Guess MD FACS ?Entered By: Duanne Guess on 05/10/2021 08:58:11 ?-------------------------------------------------------------------------------- ?HPI Details ?Patient Name: Date of Service: ?[name], [name] 05/10/2021 8:15 A M ?Medical Record Number: [id number] ?Patient Account Number: [id number] ?Date of Birth/Sex: Treating RN: ?20-Jun-1945 (76 y.o. F) [name], Varetta ?Primary Care Provider: [name] Other Clinician: ?Referring Provider: ?Treating Provider/Extender: Duanne Guess ?[name] ?Weeks in Treatment: 13 ?History of Present Illness ?HPI Description: ADMISSION ?02/08/2021 ?This is a 76 year old woman who suffered a fall in early December while going down stairs.. She was seen in the ER. She was given Rocephin and Keflex. She ?was also followed up in the ER several days later. A DVT rule out was negative. ?On 1/9 she started following up with her primary doctor. Notable for the fact that she was having some bleeding. She was continued on Keflex on 1/12 using an ?Ace wrap. She was referred to our clinic for review. She is not on any anticoagulants although she is on aspirin. She has a history of vein ligation in 2005 when ?she was in [place]. She tells me she did wear compression socks the first week that  she had this done and more recently has been using Ace wraps. ?Past medical history includes congestive heart failure, psoriasis, arthritis, right total knee replacement, vein stripping as noted. 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 ?The wound is on her right lateral lower extremity. Her ABIs were 1.1 in our clinic ?2/1; patient was admitted the clinic last week she had a contusion on the right lateral leg. It had fully evacuated for the most part when she came here. No real ?problem with this is not just the physical depth but the undermining depth superiorly. Culture I did of this last week is still pending but obviously did not grow the ?standard worrisome organisms including staph strep and the common gram-negative's. I did not give her antibiotics. We have been using silver alginate. ?2/8; patient's wound tunneling superiorly on the right leg 4.5 cm which is an improvement. Culture I did last week was negative so showing only diphtheroids. ?She does not seem to need any additional systemic antibiotics. ?At the suggestion of our intake nurse I think the best [facility] choice here would be a snap VAC under compression ?2/15; no improvement in the superior tunnel now 1 week with a snap VAC under compression. Our intake nurse points out an additional tunnel at 9:00 which I did ?not identify last time. The surface of the wound looks better. There is some drainage and she is complaining of pain ?2/22; the tunnel superiorly is now down 1 cm but not the tunnel at 9:00 area this is a probing wound going superiorly. There is no superficial aspect of this. We ?are using a snap back under compression. 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 I don't think this had any relevance as it was only "rare" ?03/15/2021: The tunneling continues to improve.  Overall, the wound dimensions are roughly the same. Based upon the coag negative staph culture, Dr. Leanord Hawking ?added  mupirocin. She continues in a snap VAC with 3 layer compression. ?03/22/2021: Continued improvement in the depth of the tunneling. Last night, she says that the snap cartridge was full and she experienced some pain. Overall ?wound dimensions appear about the same otherwise. ?03/29/2021: The depth of the tunneling has come down to 1.4 cm. She has not had any further issues with the snap VAC cartridge filling and causing her pain. ?The outer wound dimensions are roughly the same, but the periwound skin is a little bit macerated. ?04/05/2021: The depth of the tunneling continues to decrease. No issues with VAC function. Periwound skin looks better. ?04/12/2021: There has been no significant change in the wound over the last week. There has been almost no output into the VAC cartridge. No concern for ?infection. ?04/19/2021: The wound VAC was discontinued last week. We have been using silver collagen under 3 layer compression. Today, the wound orifice is quite a bit ?smaller. There is still tunneling to a depth of 1.1 cm. Minimal serous drainage present. ?04/26/2021: The wound orifice continues to contract. The tunneling is down by 1 mm. We have been using silver collagen under 3 layer compression. ?05/03/2021: The tunneling continues to decrease. We have been using 1/4 inch iodoform packing strip. No concern for infection. ?05/10/2021: The tunneling is perhaps a millimeter less. The overall wound surface is healthy with good granulation tissue. There is minimal drainage. We have ?been using a quarter inch iodoform packing strip. ?Electronic Signature(s) ?Signed: 05/10/2021 8:59:42 AM By: Duanne Guess MD FACS ?Entered By: Duanne Guess on 05/10/2021 08:59:42 ?-------------------------------------------------------------------------------- ?Physical Exam Details ?Patient Name: Date of Service: ?[name], [name] 05/10/2021 8:15 A M ?Medical Record Number: [id number] ?Patient Account Number: [id number] ?Date of Birth/Sex: Treating  RN: ?11/15/1945 (76 y.o. F) [name], Willo ?Primary Care Provider: [name] Other Clinician: ?Referring Provider: ?Treating Provider/Extender: Duanne Guess ?[name] ?Weeks in Treatment: 13 ?Constitutional ?. . . . No acute distress. ?Respiratory ?Normal work of breathing on room air. ?Notes ?05/10/2021: The wound orifice remained stable with a good surface of granulation tissue. The tunnel is perhaps a millimeter or so smaller. No concern for ?infection. ?Electronic Signature(s) ?Signed: 05/10/2021 9:02:13 AM By: Duanne Guess MD FACS ?Entered By: Duanne Guess on 05/10/2021 09:02:12 ?-------------------------------------------------------------------------------- ?Physician Orders Details ?Patient Name: Date of Service: ?[name], [name] 05/10/2021 8:15 A M ?Medical Record Number: [id number] ?Patient Account Number: [id number] ?Date of Birth/Sex: Treating RN: ?[birth year] (76 y.o. Fredderick Phenix ?Primary Care Provider: [name] Other Clinician: ?Referring Provider: ?Treating Provider/Extender: Duanne Guess ?[name] ?Weeks in Treatment: 13 ?Verbal / Phone Orders: No ?Diagnosis Coding ?ICD-10 Coding ?Code Description ?S80.11XD Contusion of right lower leg, subsequent encounter ?L97.818 Non-pressure chronic ulcer of other part of right lower leg with other specified severity ?I87.301 Chronic venous hypertension (idiopathic) without complications of right lower extremity ?Follow-up Appointments ?ppointment in 1 week. - Dr Lady Gary - Room 1 5/3 @ 8:15 ?Return A ?Bathing/ Shower/ Hygiene ?May shower with protection but do not get wound dressing(s) wet. ?Edema Control - Lymphedema / SCD / Other ?Right Lower Extremity ?Elevate legs to the level of the heart or above for 30 minutes daily and/or when sitting, a frequency of: - throughout the day ?void standing for long periods of time. - Elevate legs throughout the day ?A ?Exercise regularly ?Wound Treatment ?Wound #1 - Lower Leg  Wound Laterality: Right, Lateral ?Cleanser: Soap and  Water 1 x Per Week ?Discharge Instructions: May shower and wash wound with dial antibacterial soap and water prior to dressing change. ?Cleanser: Wound Cleanse

## 2021-05-15 NOTE — Progress Notes (Signed)
Reagor, Emonni ([id number]) ?Visit Report for 05/10/2021 ?Arrival Information Details ?Patient Name: Date of Service: ?[name], [name] 05/10/2021 8:15 A M ?Medical Record Number: [id number] ?Patient Account Number: [id number] ?Date of Birth/Sex: Treating RN: ?01/17/1945 (76 y.o. F) [name], Jannely ?Primary Care Montrice Gracey: Inda Coke Other Clinician: ?Referring Gwenneth Whiteman: ?Treating Brooklen Runquist/Extender: Fredirick Maudlin ?Inda Coke ?Weeks in Treatment: 13 ?Visit Information History Since Last Visit ?All ordered tests and consults were completed: Yes ?Patient Arrived: Ambulatory ?Added or deleted any medications: No ?Arrival Time: 08:31 ?Any new allergies or adverse reactions: No ?Accompanied By: self ?Had a fall or experienced change in No ?Transfer Assistance: None ?activities of daily living that may affect ?Patient Identification Verified: Yes ?risk of falls: ?Secondary Verification Process Completed: Yes ?Signs or symptoms of abuse/neglect since last visito No ?Patient Requires Transmission-Based Precautions: No ?Hospitalized since last visit: No ?Patient Has Alerts: No ?Implantable device outside of the clinic excluding No ?cellular tissue based products placed in the center ?since last visit: ?Has Dressing in Place as Prescribed: Yes ?Has Compression in Place as Prescribed: Yes ?Pain Present Now: No ?Electronic Signature(s) ?Signed: 05/10/2021 1:00:19 PM By: Sandre Kitty ?Entered By: Sandre Kitty on 05/10/2021 08:34:31 ?-------------------------------------------------------------------------------- ?Compression Therapy Details ?Patient Name: Date of Service: ?[name], [name] 05/10/2021 8:15 A M ?Medical Record Number: [id number] ?Patient Account Number: [id number] ?Date of Birth/Sex: Treating RN: ?1945/02/15 (76 y.o. Harlow Ohms ?Primary Care Bevan Disney: Inda Coke Other Clinician: ?Referring Halil Rentz: ?Treating Ronzell Laban/Extender: Fredirick Maudlin ?Inda Coke ?Weeks in Treatment:  13 ?Compression Therapy Performed for Wound Assessment: Wound #1 Right,Lateral Lower Leg ?Performed By: Clinician Adline Peals, RN ?Compression Type: Three Layer ?Post Procedure Diagnosis ?Same as Pre-procedure ?Electronic Signature(s) ?Signed: 05/15/2021 5:16:31 PM By: Adline Peals ?Entered By: Adline Peals on 05/10/2021 08:55:54 ?-------------------------------------------------------------------------------- ?Encounter Discharge Information Details ?Patient Name: ?Date of Service: ?[name], [name] 05/10/2021 8:15 A M ?Medical Record Number: [id number] ?Patient Account Number: [id number] ?Date of Birth/Sex: ?Treating RN: ?[birth year] (76 y.o. Harlow Ohms ?Primary Care Kinsler Soeder: Inda Coke ?Other Clinician: ?Referring Shreyan Hinz: ?Treating Dajohn Ellender/Extender: Fredirick Maudlin ?Inda Coke ?Weeks in Treatment: 13 ?Encounter Discharge Information Items ?Discharge Condition: Stable ?Ambulatory Status: Ambulatory ?Discharge Destination: Home ?Transportation: Private Auto ?Accompanied By: self ?Schedule Follow-up Appointment: Yes ?Clinical Summary of Care: Patient Declined ?Electronic Signature(s) ?Signed: 05/15/2021 5:16:31 PM By: Adline Peals ?Entered By: Adline Peals on 05/10/2021 09:10:28 ?-------------------------------------------------------------------------------- ?Lower Extremity Assessment Details ?Patient Name: ?Date of Service: ?[name], [name] 05/10/2021 8:15 A M ?Medical Record Number: [id number] ?Patient Account Number: [id number] ?Date of Birth/Sex: ?Treating RN: ?08-Jun-1945 (76 y.o. F) [name], Terrah ?Primary Care Rowdy Guerrini: Inda Coke ?Other Clinician: ?Referring Hydie Langan: ?Treating Rahm Minix/Extender: Fredirick Maudlin ?Inda Coke ?Weeks in Treatment: 13 ?Edema Assessment ?Assessed: [Left: No] [Right: No] ?Edema: [Left: Ye] [Right: s] ?Calf ?Left: Right: ?Point of Measurement: 33 cm From Medial Instep 37.7 cm ?Ankle ?Left: Right: ?Point of Measurement: 7 cm  From Medial Instep 23 cm ?Vascular Assessment ?Pulses: ?Dorsalis Pedis ?Palpable: [Right:Yes] ?Electronic Signature(s) ?Signed: 05/10/2021 4:48:49 PM By: Baruch Gouty RN, BSN ?Signed: 05/15/2021 5:16:31 PM By: Adline Peals ?Entered By: Adline Peals on 05/10/2021 08:44:58 ?-------------------------------------------------------------------------------- ?Multi Wound Chart Details ?Patient Name: ?Date of Service: ?[name], [name] 05/10/2021 8:15 A M ?Medical Record Number: [id number] ?Patient Account Number: [id number] ?Date of Birth/Sex: ?Treating RN: ?[birth year] (76 y.o. F) [name], Berklee ?Primary Care Sariah Henkin: Inda Coke ?Other Clinician: ?Referring Lovetta Condie: ?Treating Jordis Repetto/Extender: Fredirick Maudlin ?Inda Coke ?Weeks in Treatment: 13 ?Vital Signs ?Height(in): 68 ?Pulse(bpm): 71 ?Weight(lbs): 200 ?Blood Pressure(mmHg): 134/79 ?Body Mass Index(BMI): 30.4 ?Temperature(??F): 98.1 ?Respiratory Rate(breaths/min):  18 ?Photos: [N/A:N/A] ?Right, Lateral Lower Leg N/A N/A ?Wound Location: ?Trauma N/A N/A ?Wounding Event: ?Venous Leg Ulcer N/A N/A ?Primary Etiology: ?Osteoarthritis N/A N/A ?Comorbid History: ?12/15/2020 N/A N/A ?Date Acquired: ?65 N/A N/A ?Weeks of Treatment: ?Open N/A N/A ?Wound Status: ?No N/A N/A ?Wound Recurrence: ?0.4x0.3x0.3 N/A N/A ?Measurements L x W x D (cm) ?0.094 N/A N/A ?A (cm?) : ?rea ?0.028 N/A N/A ?Volume (cm?) : ?95.20% N/A N/A ?% Reduction in A rea: ?99.30% N/A N/A ?% Reduction in Volume: ?1 ?Position 1 (o'clock): ?1 ?Maximum Distance 1 (cm): ?Yes N/A N/A ?Tunneling: ?Full Thickness With Exposed Support N/A N/A ?Classification: ?Structures ?Medium N/A N/A ?Exudate Amount: ?Serosanguineous N/A N/A ?Exudate Type: ?red, brown N/A N/A ?Exudate Color: ?Well defined, not attached N/A N/A ?Wound Margin: ?Large (67-100%) N/A N/A ?Granulation Amount: ?Red N/A N/A ?Granulation Quality: ?None Present (0%) N/A N/A ?Necrotic Amount: ?Fat Layer (Subcutaneous Tissue): Yes N/A  N/A ?Exposed Structures: ?Fascia: No ?Tendon: No ?Muscle: No ?Joint: No ?Bone: No ?Medium (34-66%) N/A N/A ?Epithelialization: ?Compression Therapy N/A N/A ?Procedures Performed: ?Treatment Notes ?Electronic Signature(s) ?Signed: 05/10/2021 8:57:41 AM By: Fredirick Maudlin MD FACS ?Signed: 05/10/2021 4:48:49 PM By: Baruch Gouty RN, BSN ?Entered By: Fredirick Maudlin on 05/10/2021 08:57:41 ?-------------------------------------------------------------------------------- ?Multi-Disciplinary Care Plan Details ?Patient Name: ?Date of Service: ?[name], [name] 05/10/2021 8:15 A M ?Medical Record Number: [id number] ?Patient Account Number: [id number] ?Date of Birth/Sex: ?Treating RN: ?[birth year] (76 y.o. Harlow Ohms ?Primary Care Jaleeyah Munce: Inda Coke ?Other Clinician: ?Referring Lowanda Cashaw: ?Treating Shyleigh Daughtry/Extender: Fredirick Maudlin ?Inda Coke ?Weeks in Treatment: 13 ?Multidisciplinary Care Plan reviewed with physician ?Active Inactive ?Venous Leg Ulcer ?Nursing Diagnoses: ?Knowledge deficit related to disease process and management ?Potential for venous Insuffiency (use before diagnosis confirmed) ?Goals: ?Patient will maintain optimal edema control ?Date Initiated: 02/22/2021 ?Target Resolution Date: 05/31/2021 ?Goal Status: Active ?Interventions: ?Assess peripheral edema status every visit. ?Compression as ordered ?Provide education on venous insufficiency ?Treatment Activities: ?Therapeutic compression applied : 02/22/2021 ?Notes: ?Wound/Skin Impairment ?Nursing Diagnoses: ?Impaired tissue integrity ?Goals: ?Patient/caregiver will verbalize understanding of skin care regimen ?Date Initiated: 02/08/2021 ?Target Resolution Date: 05/31/2021 ?Goal Status: Active ?Interventions: ?Assess ulceration(s) every visit ?Notes: ?Electronic Signature(s) ?Signed: 05/15/2021 5:16:31 PM By: Adline Peals ?Entered By: Adline Peals on 05/10/2021  08:48:13 ?-------------------------------------------------------------------------------- ?Pain Assessment Details ?Patient Name: ?Date of Service: ?[name], [name] 05/10/2021 8:15 A M ?Medical Record Number: [id number] ?Patient Account Number: [id number] ?Date of Birth/Sex: ?Treating RN: ?[birth year]

## 2021-05-17 ENCOUNTER — Encounter (HOSPITAL_BASED_OUTPATIENT_CLINIC_OR_DEPARTMENT_OTHER): Payer: Medicare Other | Attending: General Surgery | Admitting: General Surgery

## 2021-05-17 DIAGNOSIS — I87301 Chronic venous hypertension (idiopathic) without complications of right lower extremity: Secondary | ICD-10-CM | POA: Diagnosis not present

## 2021-05-17 DIAGNOSIS — M1990 Unspecified osteoarthritis, unspecified site: Secondary | ICD-10-CM | POA: Diagnosis not present

## 2021-05-17 DIAGNOSIS — S8011XD Contusion of right lower leg, subsequent encounter: Secondary | ICD-10-CM | POA: Insufficient documentation

## 2021-05-17 DIAGNOSIS — L97818 Non-pressure chronic ulcer of other part of right lower leg with other specified severity: Secondary | ICD-10-CM | POA: Diagnosis not present

## 2021-05-17 DIAGNOSIS — X58XXXD Exposure to other specified factors, subsequent encounter: Secondary | ICD-10-CM | POA: Diagnosis not present

## 2021-05-17 NOTE — Progress Notes (Signed)
Bozza, Jossalyn ([id number]) ?Visit Report for 05/17/2021 ?Arrival Information Details ?Patient Name: Date of Service: ?[name], [name] 05/17/2021 8:15 A M ?Medical Record Number: [id number] ?Patient Account Number: [id number] ?Date of Birth/Sex: Treating RN: ?10/14/1945 (76 y.o. [name] ?Primary Care Adell Panek: Inda Coke Other Clinician: ?Referring Darcy Barbara: ?Treating Shaasia Odle/Extender: Fredirick Maudlin ?Inda Coke ?Weeks in Treatment: 14 ?Visit Information History Since Last Visit ?Added or deleted any medications: No ?Patient Arrived: Ambulatory ?Any new allergies or adverse reactions: No ?Arrival Time: 08:17 ?Had a fall or experienced change in No ?Accompanied By: self ?activities of daily living that may affect ?Transfer Assistance: None ?risk of falls: ?Patient Identification Verified: Yes ?Signs or symptoms of abuse/neglect since last visito No ?Secondary Verification Process Completed: Yes ?Hospitalized since last visit: No ?Patient Requires Transmission-Based Precautions: No ?Implantable device outside of the clinic excluding No ?Patient Has Alerts: No ?cellular tissue based products placed in the center ?since last visit: ?Has Dressing in Place as Prescribed: Yes ?Has Compression in Place as Prescribed: Yes ?Pain Present Now: No ?Electronic Signature(s) ?Signed: 05/17/2021 5:20:53 PM By: Adline Peals ?Entered By: Adline Peals on 05/17/2021 08:20:09 ?-------------------------------------------------------------------------------- ?Compression Therapy Details ?Patient Name: Date of Service: ?[name], [name] 05/17/2021 8:15 A M ?Medical Record Number: [id number] ?Patient Account Number: [id number] ?Date of Birth/Sex: Treating RN: ?02/25/1945 (76 y.o. F) [name], [name] ?Primary Care Marchella Hibbard: Inda Coke Other Clinician: ?Referring Tamecka Milham: ?Treating Jaziya Obarr/Extender: Fredirick Maudlin ?Inda Coke ?Weeks in Treatment: 14 ?Compression Therapy Performed for Wound Assessment: Wound  #1 Right,Lateral Lower Leg ?Performed By: Clinician Baruch Gouty, RN ?Compression Type: Three Layer ?Post Procedure Diagnosis ?Same as Pre-procedure ?Electronic Signature(s) ?Signed: 05/17/2021 5:38:18 PM By: Baruch Gouty RN, BSN ?Entered By: Baruch Gouty on 05/17/2021 08:44:11 ?-------------------------------------------------------------------------------- ?Encounter Discharge Information Details ?Patient Name: ?Date of Service: ?[name], [name] 05/17/2021 8:15 A M ?Medical Record Number: [id number] ?Patient Account Number: [id number] ?Date of Birth/Sex: ?Treating RN: ?April 07, 1945 (76 y.o. F) [name], [name] ?Primary Care Tanetta Fuhriman: Inda Coke ?Other Clinician: ?Referring Ireene Ballowe: ?Treating Telia Amundson/Extender: Fredirick Maudlin ?Inda Coke ?Weeks in Treatment: 14 ?Encounter Discharge Information Items ?Discharge Condition: Stable ?Ambulatory Status: Ambulatory ?Discharge Destination: Home ?Transportation: Private Auto ?Accompanied By: self ?Schedule Follow-up Appointment: Yes ?Clinical Summary of Care: Patient Declined ?Electronic Signature(s) ?Signed: 05/17/2021 5:38:18 PM By: Baruch Gouty RN, BSN ?Entered By: Baruch Gouty on 05/17/2021 08:56:53 ?-------------------------------------------------------------------------------- ?Lower Extremity Assessment Details ?Patient Name: ?Date of Service: ?[name], [name] 05/17/2021 8:15 A M ?Medical Record Number: [id number] ?Patient Account Number: [id number] ?Date of Birth/Sex: ?Treating RN: ?10/23/1945 (76 y.o. [name] ?Primary Care Nico Rogness: Inda Coke ?Other Clinician: ?Referring Yuvan Medinger: ?Treating Jood Retana/Extender: Fredirick Maudlin ?Inda Coke ?Weeks in Treatment: 14 ?Edema Assessment ?Assessed: [Left: No] [Right: No] ?Edema: [Left: Ye] [Right: s] ?Calf ?Left: Right: ?Point of Measurement: 33 cm From Medial Instep 37.8 cm ?Ankle ?Left: Right: ?Point of Measurement: 7 cm From Medial Instep 23 cm ?Vascular Assessment ?Pulses: ?Dorsalis  Pedis ?Palpable: [Right:Yes] ?Electronic Signature(s) ?Signed: 05/17/2021 5:20:53 PM By: Adline Peals ?Entered By: Adline Peals on 05/17/2021 08:23:35 ?-------------------------------------------------------------------------------- ?Multi Wound Chart Details ?Patient Name: ?Date of Service: ?[name], [name] 05/17/2021 8:15 A M ?Medical Record Number: [id number] ?Patient Account Number: [id number] ?Date of Birth/Sex: ?Treating RN: ?07-03-1945 (76 y.o. F) [name], [name] ?Primary Care Kashmir Lysaght: Inda Coke ?Other Clinician: ?Referring Fraya Ueda: ?Treating Elaura Calix/Extender: Fredirick Maudlin ?Inda Coke ?Weeks in Treatment: 14 ?Vital Signs ?Height(in): 68 ?Pulse(bpm): 72 ?Weight(lbs): 200 ?Blood Pressure(mmHg): 126/66 ?Body Mass Index(BMI): 30.4 ?Temperature(??F): 97.5 ?Respiratory Rate(breaths/min): 18 ?Photos: [N/A:N/A] ?Right, Lateral Lower Leg N/A N/A ?Wound Location: ?Trauma N/A  N/A ?Wounding Event: ?Venous Leg Ulcer N/A N/A ?Primary Etiology: ?Osteoarthritis N/A N/A ?Comorbid History: ?12/15/2020 N/A N/A ?Date Acquired: ?59 N/A N/A ?Weeks of Treatment: ?Open N/A N/A ?Wound Status: ?No N/A N/A ?Wound Recurrence: ?0.5x0.4x0.3 N/A N/A ?Measurements L x W x D (cm) ?0.157 N/A N/A ?A (cm?) : ?rea ?0.047 N/A N/A ?Volume (cm?) : ?92.00% N/A N/A ?% Reduction in A rea: ?98.80% N/A N/A ?% Reduction in Volume: ?12 ?Position 1 (o'clock): ?0.7 ?Maximum Distance 1 (cm): ?Yes N/A N/A ?Tunneling: ?Full Thickness With Exposed Support N/A N/A ?Classification: ?Structures ?Medium N/A N/A ?Exudate Amount: ?Serosanguineous N/A N/A ?Exudate Type: ?red, brown N/A N/A ?Exudate Color: ?Well defined, not attached N/A N/A ?Wound Margin: ?Large (67-100%) N/A N/A ?Granulation Amount: ?Red N/A N/A ?Granulation Quality: ?None Present (0%) N/A N/A ?Necrotic Amount: ?Fat Layer (Subcutaneous Tissue): Yes N/A N/A ?Exposed Structures: ?Fascia: No ?Tendon: No ?Muscle: No ?Joint: No ?Bone: No ?Medium (34-66%) N/A  N/A ?Epithelialization: ?Compression Therapy N/A N/A ?Procedures Performed: ?Treatment Notes ?Electronic Signature(s) ?Signed: 05/17/2021 8:44:46 AM By: Fredirick Maudlin MD FACS ?Signed: 05/17/2021 5:38:18 PM By: Baruch Gouty RN, BSN ?Entered By: Fredirick Maudlin on 05/17/2021 08:44:46 ?-------------------------------------------------------------------------------- ?Multi-Disciplinary Care Plan Details ?Patient Name: ?Date of Service: ?[name], Sheyli 05/17/2021 8:15 A M ?Medical Record Number: [id number] ?Patient Account Number: [id number] ?Date of Birth/Sex: ?Treating RN: ?[birth year] (76 y.o. [name] ?Primary Care Dhanya Bogle: Inda Coke ?Other Clinician: ?Referring Mccartney Chuba: ?Treating Panfilo Ketchum/Extender: Fredirick Maudlin ?Inda Coke ?Weeks in Treatment: 14 ?Multidisciplinary Care Plan reviewed with physician ?Active Inactive ?Venous Leg Ulcer ?Nursing Diagnoses: ?Knowledge deficit related to disease process and management ?Potential for venous Insuffiency (use before diagnosis confirmed) ?Goals: ?Patient will maintain optimal edema control ?Date Initiated: 02/22/2021 ?Target Resolution Date: 05/31/2021 ?Goal Status: Active ?Interventions: ?Assess peripheral edema status every visit. ?Compression as ordered ?Provide education on venous insufficiency ?Treatment Activities: ?Therapeutic compression applied : 02/22/2021 ?Notes: ?Wound/Skin Impairment ?Nursing Diagnoses: ?Impaired tissue integrity ?Goals: ?Patient/caregiver will verbalize understanding of skin care regimen ?Date Initiated: 02/08/2021 ?Target Resolution Date: 05/31/2021 ?Goal Status: Active ?Interventions: ?Assess ulceration(s) every visit ?Notes: ?Electronic Signature(s) ?Signed: 05/17/2021 5:20:53 PM By: Adline Peals ?Entered By: Adline Peals on 05/17/2021 08:33:56 ?-------------------------------------------------------------------------------- ?Pain Assessment Details ?Patient Name: ?Date of Service: ?[name], Demya 05/17/2021 8:15  A M ?Medical Record Number: [id number] ?Patient Account Number: [id number] ?Date of Birth/Sex: ?Treating RN: ?July 21, 1945 (76 y.o. [name] ?Primary Care Gelsey Amyx: Inda Coke ?Other Clinician: ?Referring Edsel Shives

## 2021-05-17 NOTE — Progress Notes (Addendum)
[name], [name] ([id number]) ?Visit Report for 05/17/2021 ?Chief Complaint Document Details ?Patient Name: Date of Service: ?[name], [name] 05/17/2021 8:15 A M ?Medical Record Number: [id number] ?Patient Account Number: [id number] ?Date of Birth/Sex: Treating RN: ?14-Mar-1945 (76 y.o. F) [name], Rhyder ?Primary Care Provider: [name] Other Clinician: ?Referring Provider: ?Treating Provider/Extender: Duanne Guess ?[name] ?Weeks in Treatment: 14 ?Information Obtained from: Patient ?Chief Complaint ?02/08/2021; patient is here for review of a wound on the right lateral lower leg which was initially traumatic ?Electronic Signature(s) ?Signed: 05/17/2021 8:44:52 AM By: Duanne Guess MD FACS ?Entered By: Duanne Guess on 05/17/2021 08:44:52 ?-------------------------------------------------------------------------------- ?HPI Details ?Patient Name: Date of Service: ?[name], [name] 05/17/2021 8:15 A M ?Medical Record Number: [id number] ?Patient Account Number: [id number] ?Date of Birth/Sex: Treating RN: ?11/28/1945 (76 y.o. F) [name], [name] ?Primary Care Provider: [name] Other Clinician: ?Referring Provider: ?Treating Provider/Extender: Duanne Guess ?[name] ?Weeks in Treatment: 14 ?History of Present Illness ?HPI Description: ADMISSION ?02/08/2021 ?This is a 76 year old woman who suffered a fall in early December while going down stairs.. She was seen in the ER. She was given Rocephin and Keflex. She ?was also followed up in the ER several days later. A DVT rule out was negative. ?On 1/9 she started following up with her primary doctor. Notable for the fact that she was having some bleeding. She was continued on Keflex on 1/12 using an ?Ace wrap. She was referred to our clinic for review. She is not on any anticoagulants although she is on aspirin. She has a history of vein ligation in 2005 when ?she was in [place]. She tells me she did wear compression socks the first week that she  had this done and more recently has been using Ace wraps. ?Past medical history includes congestive heart failure, psoriasis, arthritis, right total knee replacement, vein stripping as noted. 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 ?The wound is on her right lateral lower extremity. Her ABIs were 1.1 in our clinic ?2/1; patient was admitted the clinic last week she had a contusion on the right lateral leg. It had fully evacuated for the most part when she came here. No real ?problem with this is not just the physical depth but the undermining depth superiorly. Culture I did of this last week is still pending but obviously did not grow the ?standard worrisome organisms including staph strep and the common gram-negative's. I did not give her antibiotics. We have been using silver alginate. ?2/8; patient's wound tunneling superiorly on the right leg 4.5 cm which is an improvement. Culture I did last week was negative so showing only diphtheroids. ?She does not seem to need any additional systemic antibiotics. ?At the suggestion of our intake nurse I think the best [facility] choice here would be a snap VAC under compression ?2/15; no improvement in the superior tunnel now 1 week with a snap VAC under compression. Our intake nurse points out an additional tunnel at 9:00 which I did ?not identify last time. The surface of the wound looks better. There is some drainage and she is complaining of pain ?2/22; the tunnel superiorly is now down 1 cm but not the tunnel at 9:00 area this is a probing wound going superiorly. There is no superficial aspect of this. We ?are using a snap back under compression. 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 I don't think this had any relevance as it was only "rare" ?03/15/2021: The tunneling continues to improve.  Overall, the wound dimensions are roughly the same. Based upon the coag negative staph culture, Dr. Leanord Hawking ?added mupirocin.  She continues in a snap VAC with 3 layer compression. ?03/22/2021: Continued improvement in the depth of the tunneling. Last night, she says that the snap cartridge was full and she experienced some pain. Overall ?wound dimensions appear about the same otherwise. ?03/29/2021: The depth of the tunneling has come down to 1.4 cm. She has not had any further issues with the snap VAC cartridge filling and causing her pain. ?The outer wound dimensions are roughly the same, but the periwound skin is a little bit macerated. ?04/05/2021: The depth of the tunneling continues to decrease. No issues with VAC function. Periwound skin looks better. ?04/12/2021: There has been no significant change in the wound over the last week. There has been almost no output into the VAC cartridge. No concern for ?infection. ?04/19/2021: The wound VAC was discontinued last week. We have been using silver collagen under 3 layer compression. Today, the wound orifice is quite a bit ?smaller. There is still tunneling to a depth of 1.1 cm. Minimal serous drainage present. ?04/26/2021: The wound orifice continues to contract. The tunneling is down by 1 mm. We have been using silver collagen under 3 layer compression. ?05/03/2021: The tunneling continues to decrease. We have been using 1/4 inch iodoform packing strip. No concern for infection. ?05/10/2021: The tunneling is perhaps a millimeter less. The overall wound surface is healthy with good granulation tissue. There is minimal drainage. We have ?been using a quarter inch iodoform packing strip. ?05/17/2021: Slowly but surely, the tunnel is decreasing in length. The wound surface is healthy with good granulation tissue. We are still using quarter inch ?iodoform packing strips. ?Electronic Signature(s) ?Signed: 05/17/2021 8:45:25 AM By: Duanne Guess MD FACS ?Entered By: Duanne Guess on 05/17/2021 08:45:24 ?-------------------------------------------------------------------------------- ?Physical Exam  Details ?Patient Name: Date of Service: ?[name], [name] 05/17/2021 8:15 A M ?Medical Record Number: [id number] ?Patient Account Number: [id number] ?Date of Birth/Sex: Treating RN: ?1946/01/10 (76 y.o. F) [name], Carmeline ?Primary Care Provider: [name] Other Clinician: ?Referring Provider: ?Treating Provider/Extender: Duanne Guess ?[name] ?Weeks in Treatment: 14 ?Constitutional ?. . . . No acute distress. ?Respiratory ?Normal work of breathing on room air. ?Notes ?05/17/2021: The tunnel is slowly continuing to decrease in length. The wound surface is healthy with good granulation tissue. No concern for infection. ?Electronic Signature(s) ?Signed: 05/17/2021 8:46:09 AM By: Duanne Guess MD FACS ?Entered By: Duanne Guess on 05/17/2021 08:46:09 ?-------------------------------------------------------------------------------- ?Physician Orders Details ?Patient Name: Date of Service: ?[name], Laquita 05/17/2021 8:15 A M ?Medical Record Number: [id number] ?Patient Account Number: [id number] ?Date of Birth/Sex: Treating RN: ?[birth year] (76 y.o. 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 - Dr Lady Gary - Room 1 ?Return A ?5/10 @ 08:15 ?Bathing/ Shower/ Hygiene ?May shower with protection but do not get wound dressing(s) wet. ?Edema Control - Lymphedema / SCD / Other ?Right Lower Extremity ?Elevate legs to the level of the heart or above for 30 minutes daily and/or when sitting, a frequency of: -  throughout the day ?void standing for long periods  of time. - Elevate legs throughout the day ?A ?Exercise regularly ?Wound Treatment ?Wound #1 - Lower Leg Wound Laterality: Right, Lateral ?Cleanser: Soap and

## 2021-05-24 ENCOUNTER — Encounter (HOSPITAL_BASED_OUTPATIENT_CLINIC_OR_DEPARTMENT_OTHER): Payer: Medicare Other | Admitting: General Surgery

## 2021-05-24 DIAGNOSIS — S8011XD Contusion of right lower leg, subsequent encounter: Secondary | ICD-10-CM | POA: Diagnosis not present

## 2021-05-24 NOTE — Progress Notes (Signed)
[name], [name] ([id number]) ?Visit Report for 05/24/2021 ?Chief Complaint Document Details ?Patient Name: Date of Service: ?[name], [name] 05/24/2021 8:15 A M ?Medical Record Number: [id number] ?Patient Account Number: [id number] ?Date of Birth/Sex: Treating RN: ?1945-07-28 (76 y.o. F) [name], Kassidi ?Primary Care Provider: [name] Other Clinician: ?Referring Provider: ?Treating Provider/Extender: Duanne Guess ?[name] ?Weeks in Treatment: 15 ?Information Obtained from: Patient ?Chief Complaint ?02/08/2021; patient is here for review of a wound on the right lateral lower leg which was initially traumatic ?Electronic Signature(s) ?Signed: 05/24/2021 8:59:51 AM By: Duanne Guess MD FACS ?Entered By: Duanne Guess on 05/24/2021 08:59:50 ?-------------------------------------------------------------------------------- ?HPI Details ?Patient Name: Date of Service: ?[name], [name] 05/24/2021 8:15 A M ?Medical Record Number: [id number] ?Patient Account Number: [id number] ?Date of Birth/Sex: Treating RN: ?Jul 22, 1945 (76 y.o. F) [name], Quantisha ?Primary Care Provider: [name] Other Clinician: ?Referring Provider: ?Treating Provider/Extender: Duanne Guess ?[name] ?Weeks in Treatment: 15 ?History of Present Illness ?HPI Description: ADMISSION ?02/08/2021 ?This is a 76 year old woman who suffered a fall in early December while going down stairs.. She was seen in the ER. She was given Rocephin and Keflex. She ?was also followed up in the ER several days later. A DVT rule out was negative. ?On 1/9 she started following up with her primary doctor. Notable for the fact that she was having some bleeding. She was continued on Keflex on 1/12 using an ?Ace wrap. She was referred to our clinic for review. She is not on any anticoagulants although she is on aspirin. She has a history of vein ligation in 2005 when ?she was in [place]. She tells me she did wear compression socks the first week that  she had this done and more recently has been using Ace wraps. ?Past medical history includes congestive heart failure, psoriasis, arthritis, right total knee replacement, vein stripping as noted. 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 ?The wound is on her right lateral lower extremity. Her ABIs were 1.1 in our clinic ?2/1; patient was admitted the clinic last week she had a contusion on the right lateral leg. It had fully evacuated for the most part when she came here. No real ?problem with this is not just the physical depth but the undermining depth superiorly. Culture I did of this last week is still pending but obviously did not grow the ?standard worrisome organisms including staph strep and the common gram-negative's. I did not give her antibiotics. We have been using silver alginate. ?2/8; patient's wound tunneling superiorly on the right leg 4.5 cm which is an improvement. Culture I did last week was negative so showing only diphtheroids. ?She does not seem to need any additional systemic antibiotics. ?At the suggestion of our intake nurse I think the best [facility] choice here would be a snap VAC under compression ?2/15; no improvement in the superior tunnel now 1 week with a snap VAC under compression. Our intake nurse points out an additional tunnel at 9:00 which I did ?not identify last time. The surface of the wound looks better. There is some drainage and she is complaining of pain ?2/22; the tunnel superiorly is now down 1 cm but not the tunnel at 9:00 area this is a probing wound going superiorly. There is no superficial aspect of this. We ?are using a snap back under compression. 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 I don't think this had any relevance as it was only "rare" ?03/15/2021: The tunneling continues to improve.  Overall, the wound dimensions are roughly the same. Based upon the coag negative staph culture, Dr. Leanord Hawkingobson ?added  mupirocin. She continues in a snap VAC with 3 layer compression. ?03/22/2021: Continued improvement in the depth of the tunneling. Last night, she says that the snap cartridge was full and she experienced some pain. Overall ?wound dimensions appear about the same otherwise. ?03/29/2021: The depth of the tunneling has come down to 1.4 cm. She has not had any further issues with the snap VAC cartridge filling and causing her pain. ?The outer wound dimensions are roughly the same, but the periwound skin is a little bit macerated. ?04/05/2021: The depth of the tunneling continues to decrease. No issues with VAC function. Periwound skin looks better. ?04/12/2021: There has been no significant change in the wound over the last week. There has been almost no output into the VAC cartridge. No concern for ?infection. ?04/19/2021: The wound VAC was discontinued last week. We have been using silver collagen under 3 layer compression. Today, the wound orifice is quite a bit ?smaller. There is still tunneling to a depth of 1.1 cm. Minimal serous drainage present. ?04/26/2021: The wound orifice continues to contract. The tunneling is down by 1 mm. We have been using silver collagen under 3 layer compression. ?05/03/2021: The tunneling continues to decrease. We have been using 1/4 inch iodoform packing strip. No concern for infection. ?05/10/2021: The tunneling is perhaps a millimeter less. The overall wound surface is healthy with good granulation tissue. There is minimal drainage. We have ?been using a quarter inch iodoform packing strip. ?05/17/2021: Slowly but surely, the tunnel is decreasing in length. The wound surface is healthy with good granulation tissue. We are still using quarter inch ?iodoform packing strips. ?05/24/2021: The tunnel has finally closed. The wound is down to just a small opening in the skin with robust granulation tissue. ?Electronic Signature(s) ?Signed: 05/24/2021 9:00:15 AM By: Duanne Guessannon, Atley Neubert MD FACS ?Entered  By: Duanne Guessannon, Tejas Seawood on 05/24/2021 09:00:15 ?-------------------------------------------------------------------------------- ?Physical Exam Details ?Patient Name: Date of Service: ?[name] GellGA RNER, Priyana 05/24/2021 8:15 A M ?Medical Record Number: [id number] ?Patient Account Number: [id number] ?Date of Birth/Sex: Treating RN: ?10/12/1945 (76 y.o. F) [name], Shakoya ?Primary Care Provider: Jarold MottoWorley, Samantha Other Clinician: ?Referring Provider: ?Treating Provider/Extender: Duanne Guessannon, Willena Jeancharles ?Jarold MottoWorley, Samantha ?Weeks in Treatment: 15 ?Constitutional ?. . . . No acute distress. ?Respiratory ?Normal work of breathing on room air. ?Notes ?05/24/2021: The tunnel is finally closed. The wound opening is small with a healthy bed of granulation tissue. ?Electronic Signature(s) ?Signed: 05/24/2021 9:00:56 AM By: Duanne Guessannon, Chyann Ambrocio MD FACS ?Entered By: Duanne Guessannon, Tyress Loden on 05/24/2021 09:00:56 ?-------------------------------------------------------------------------------- ?Physician Orders Details ?Patient Name: Date of Service: ?[name] GellGA RNER, Lorenza 05/24/2021 8:15 A M ?Medical Record Number: [id number] ?Patient Account Number: [id number] ?Date of Birth/Sex: Treating RN: ?05/25/1945 (76 y.o. F) [name], Leia ?Primary Care Provider: Jarold MottoWorley, Samantha Other Clinician: ?Referring Provider: ?Treating Provider/Extender: Duanne Guessannon, Miangel Flom ?Jarold MottoWorley, Samantha ?Weeks in Treatment: 15 ?Verbal / Phone Orders: No ?Diagnosis Coding ?ICD-10 Coding ?Code Description ?S80.11XD Contusion of right lower leg, subsequent encounter ?L97.818 Non-pressure chronic ulcer of other part of right lower leg with other specified severity ?I87.301 Chronic venous hypertension (idiopathic) without complications of right lower extremity ?Follow-up Appointments ?ppointment in 1 week. - Dr Lady Garyannon - Room 1 ?Return A ?5/17 @ 08:15 ?Bathing/ Shower/ Hygiene ?May shower with protection but do not get wound dressing(s) wet. ?Edema Control - Lymphedema / SCD / Other ?Right Lower Extremity ?Elevate  legs to the level of the heart or above for 30  minutes daily and/or when sitting, a frequency of: - throughout the day ?void standing for long periods of time. - Elevate legs throughout the day ?A ?Exercise regu

## 2021-05-31 ENCOUNTER — Encounter (HOSPITAL_BASED_OUTPATIENT_CLINIC_OR_DEPARTMENT_OTHER): Payer: Medicare Other | Admitting: General Surgery

## 2021-05-31 DIAGNOSIS — S8011XD Contusion of right lower leg, subsequent encounter: Secondary | ICD-10-CM | POA: Diagnosis not present

## 2021-05-31 NOTE — Progress Notes (Signed)
Geisel, Saren ([id number]) ?Visit Report for 05/31/2021 ?Chief Complaint Document Details ?Patient Name: Date of Service: ?[name], Kindell 05/31/2021 8:15 A M ?Medical Record Number: [id number] ?Patient Account Number: [id number] ?Date of Birth/Sex: Treating RN: ?1945-05-02 (76 y.o. F) ?Primary Care Provider: [name] Other Clinician: ?Referring Provider: ?Treating Provider/Extender: Duanne Guess ?[name] ?Weeks in Treatment: 16 ?Information Obtained from: Patient ?Chief Complaint ?02/08/2021; patient is here for review of a wound on the right lateral lower leg which was initially traumatic ?Electronic Signature(s) ?Signed: 05/31/2021 9:05:19 AM By: Duanne Guess MD FACS ?Entered By: Duanne Guess on 05/31/2021 09:05:18 ?-------------------------------------------------------------------------------- ?HPI Details ?Patient Name: Date of Service: ?[name], Finlee 05/31/2021 8:15 A M ?Medical Record Number: [id number] ?Patient Account Number: [id number] ?Date of Birth/Sex: Treating RN: ?01/31/1945 (76 y.o. F) ?Primary Care Provider: [name] Other Clinician: ?Referring Provider: ?Treating Provider/Extender: Duanne Guess ?[name] ?Weeks in Treatment: 16 ?History of Present Illness ?HPI Description: ADMISSION ?02/08/2021 ?This is a 76 year old woman who suffered a fall in early December while going down stairs.. She was seen in the ER. She was given Rocephin and Keflex. She ?was also followed up in the ER several days later. A DVT rule out was negative. ?On 1/9 she started following up with her primary doctor. Notable for the fact that she was having some bleeding. She was continued on Keflex on 1/12 using an ?Ace wrap. She was referred to our clinic for review. She is not on any anticoagulants although she is on aspirin. She has a history of vein ligation in 2005 when ?she was in [place]. She tells me she did wear compression socks the first week that she had this done and more  recently has been using Ace wraps. ?Past medical history includes congestive heart failure, psoriasis, arthritis, right total knee replacement, vein stripping as noted. 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 ?The wound is on her right lateral lower extremity. Her ABIs were 1.1 in our clinic ?2/1; patient was admitted the clinic last week she had a contusion on the right lateral leg. It had fully evacuated for the most part when she came here. No real ?problem with this is not just the physical depth but the undermining depth superiorly. Culture I did of this last week is still pending but obviously did not grow the ?standard worrisome organisms including staph strep and the common gram-negative's. I did not give her antibiotics. We have been using silver alginate. ?2/8; patient's wound tunneling superiorly on the right leg 4.5 cm which is an improvement. Culture I did last week was negative so showing only diphtheroids. ?She does not seem to need any additional systemic antibiotics. ?At the suggestion of our intake nurse I think the best [facility] choice here would be a snap VAC under compression ?2/15; no improvement in the superior tunnel now 1 week with a snap VAC under compression. Our intake nurse points out an additional tunnel at 9:00 which I did ?not identify last time. The surface of the wound looks better. There is some drainage and she is complaining of pain ?2/22; the tunnel superiorly is now down 1 cm but not the tunnel at 9:00 area this is a probing wound going superiorly. There is no superficial aspect of this. We ?are using a snap back under compression. 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 I don't think this had any relevance as it was only "rare" ?03/15/2021: The tunneling continues to improve. Overall, the wound dimensions  are roughly the same. Based upon the coag negative staph culture, Dr. Leanord Hawkingobson ?added mupirocin. She continues in a snap  VAC with 3 layer compression. ?03/22/2021: Continued improvement in the depth of the tunneling. Last night, she says that the snap cartridge was full and she experienced some pain. Overall ?wound dimensions appear about the same otherwise. ?03/29/2021: The depth of the tunneling has come down to 1.4 cm. She has not had any further issues with the snap VAC cartridge filling and causing her pain. ?The outer wound dimensions are roughly the same, but the periwound skin is a little bit macerated. ?04/05/2021: The depth of the tunneling continues to decrease. No issues with VAC function. Periwound skin looks better. ?04/12/2021: There has been no significant change in the wound over the last week. There has been almost no output into the VAC cartridge. No concern for ?infection. ?04/19/2021: The wound VAC was discontinued last week. We have been using silver collagen under 3 layer compression. Today, the wound orifice is quite a bit ?smaller. There is still tunneling to a depth of 1.1 cm. Minimal serous drainage present. ?04/26/2021: The wound orifice continues to contract. The tunneling is down by 1 mm. We have been using silver collagen under 3 layer compression. ?05/03/2021: The tunneling continues to decrease. We have been using 1/4 inch iodoform packing strip. No concern for infection. ?05/10/2021: The tunneling is perhaps a millimeter less. The overall wound surface is healthy with good granulation tissue. There is minimal drainage. We have ?been using a quarter inch iodoform packing strip. ?05/17/2021: Slowly but surely, the tunnel is decreasing in length. The wound surface is healthy with good granulation tissue. We are still using quarter inch ?iodoform packing strips. ?05/24/2021: The tunnel has finally closed. The wound is down to just a small opening in the skin with robust granulation tissue. ?05/31/2021: Her wound has healed. ?Electronic Signature(s) ?Signed: 05/31/2021 9:05:32 AM By: Duanne Guessannon, Ilee Randleman MD FACS ?Entered By:  Duanne Guessannon, Aella Ronda on 05/31/2021 09:05:32 ?-------------------------------------------------------------------------------- ?Physical Exam Details ?Patient Name: Date of Service: ?[name] GellGA RNER, Jilene 05/31/2021 8:15 A M ?Medical Record Number: [id number] ?Patient Account Number: [id number] ?Date of Birth/Sex: Treating RN: ?11/16/1945 ([age] y.o. F) ?Primary Care Provider: Jarold MottoWorley, Samantha Other Clinician: ?Referring Provider: ?Treating Provider/Extender: Duanne Guessannon, Laelynn Blizzard ?Jarold MottoWorley, Samantha ?Weeks in Treatment: 16 ?Constitutional ?Slightly hypertensive. . . . No acute distress. ?Respiratory ?Normal work of breathing on room air. ?Notes ?05/31/2021: Her wound has healed. ?Electronic Signature(s) ?Signed: 05/31/2021 9:06:03 AM By: Duanne Guessannon, Siddiq Kaluzny MD FACS ?Entered By: Duanne Guessannon, Tesia Lybrand on 05/31/2021 09:06:03 ?-------------------------------------------------------------------------------- ?Physician Orders Details ?Patient Name: Date of Service: ?[name] GellGA RNER, Leonna 05/31/2021 8:15 A M ?Medical Record Number: [id number] ?Patient Account Number: [id number] ?Date of Birth/Sex: Treating RN: ?07/15/1945 (76 y.o. 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 - Elevate legs throughout the  day ?A ?Patient to wear own compression stockings every day. ?Exercise  regularly ?Electronic Signature(s) ?Signed: 05/31/2021 9:06:16 AM By: Duanne Guess MD FACS ?Entered By: Duanne Guess on 05/31/2021 09:

## 2021-06-01 NOTE — Progress Notes (Signed)
[name] ([id number]) Visit Report for 05/31/2021 Arrival Information Details Patient Name: Date of Service: [name], [name] 05/31/2021 8:15 A M Medical Record Number: [id number] Patient Account Number: [id number] Date of Birth/Sex: Treating RN: July 21, 1945 (76 y.o. F) Primary Care Erby Sanderson: [name] Other Clinician: Referring Matika Bartell: Treating Kamika Goodloe/Extender: Ermalene Searing in Treatment: 16 Visit Information History Since Last Visit Added or deleted any medications: No Patient Arrived: Ambulatory Any new allergies or adverse reactions: No Arrival Time: 08:28 Had a fall or experienced change in No Accompanied By: self activities of daily living that may affect Transfer Assistance: None risk of falls: Patient Identification Verified: Yes Signs or symptoms of abuse/neglect since last visito No Secondary Verification Process Completed: Yes Hospitalized since last visit: No Patient Requires Transmission-Based Precautions: No Implantable device outside of the clinic excluding No Patient Has Alerts: No cellular tissue based products placed in the center since last visit: Has Dressing in Place as Prescribed: Yes Pain Present Now: No Electronic Signature(s) Signed: 06/01/2021 8:19:25 AM By: Karl Ito Entered By: Karl Ito on 05/31/2021 08:28:32 -------------------------------------------------------------------------------- Clinic Level of Care Assessment Details Patient Name: Date of Service: [name], [name] 05/31/2021 8:15 A M Medical Record Number: [id number] Patient Account Number: [id number] Date of Birth/Sex: Treating RN: 12/04/1945 (76 y.o. Tommye Standard Primary Care Jeanna Giuffre: [name] Other Clinician: Referring Jameriah Trotti: Treating Steaven Wholey/Extender: Gwyndolyn Kaufman Weeks in Treatment: 16 Clinic Level of Care Assessment Items TOOL 4 Quantity Score []  - 0 Use when only an EandM is performed on  FOLLOW-UP visit ASSESSMENTS - Nursing Assessment / Reassessment X- 1 10 Reassessment of Co-morbidities (includes updates in patient status) X- 1 5 Reassessment of Adherence to Treatment Plan ASSESSMENTS - Wound and Skin A ssessment / Reassessment X - Simple Wound Assessment / Reassessment - one wound 1 5 []  - 0 Complex Wound Assessment / Reassessment - multiple wounds []  - 0 Dermatologic / Skin Assessment (not related to wound area) ASSESSMENTS - Focused Assessment X- 1 5 Circumferential Edema Measurements - multi extremities []  - 0 Nutritional Assessment / Counseling / Intervention X- 1 5 Lower Extremity Assessment (monofilament, tuning fork, pulses) []  - 0 Peripheral Arterial Disease Assessment (using hand held doppler) ASSESSMENTS - Ostomy and/or Continence Assessment and Care []  - 0 Incontinence Assessment and Management []  - 0 Ostomy Care Assessment and Management (repouching, etc.) PROCESS - Coordination of Care X - Simple Patient / Family Education for ongoing care 1 15 []  - 0 Complex (extensive) Patient / Family Education for ongoing care X- 1 10 Staff obtains , Records, T Results / Process Orders est []  - 0 Staff telephones HHA, Nursing Homes / Clarify orders / etc []  - 0 Routine Transfer to another Facility (non-emergent condition) []  - 0 Routine Hospital Admission (non-emergent condition) []  - 0 New Admissions / / Ordering NPWT Apligraf, etc. , []  - 0 Emergency Hospital Admission (emergent condition) X- 1 10 Simple Discharge Coordination []  - 0 Complex (extensive) Discharge Coordination PROCESS - Special Needs []  - 0 Pediatric / Minor Patient Management []  - 0 Isolation Patient Management []  - 0 Hearing / Language / Visual special needs []  - 0 Assessment of Community assistance (transportation, D/C planning, etc.) []  - 0 Additional assistance / Altered mentation []  - 0 Support Surface(s) Assessment (bed, cushion,  seat, etc.) INTERVENTIONS - Wound Cleansing / Measurement X - Simple Wound Cleansing - one wound 1 5 []  - 0 Complex Wound Cleansing - multiple wounds []  - 0 Wound Imaging (  photographs - any number of wounds) []  - 0 Wound Tracing (instead of photographs) []  - 0 Simple Wound Measurement - one wound []  - 0 Complex Wound Measurement - multiple wounds INTERVENTIONS - Wound Dressings X - Small Wound Dressing one or multiple wounds 1 10 []  - 0 Medium Wound Dressing one or multiple wounds []  - 0 Large Wound Dressing one or multiple wounds []  - 0 Application of Medications - topical []  - 0 Application of Medications - injection INTERVENTIONS - Miscellaneous []  - 0 External ear exam []  - 0 Specimen Collection (cultures, biopsies, blood, body fluids, etc.) []  - 0 Specimen(s) / Culture(s) sent or taken to Lab for analysis []  - 0 Patient Transfer (multiple staff / / Similar devices) []  - 0 Simple Staple / Suture removal (25 or less) []  - 0 Complex Staple / Suture removal (26 or more) []  - 0 Hypo / Hyperglycemic Management (close monitor of Blood Glucose) []  - 0 Ankle / Brachial Index (ABI) - do not check if billed separately X- 1 5 Vital Signs Has the patient been seen at the hospital within the last three years: Yes Total Score: 85 Level Of Care: New/Established - Level 3 Electronic Signature(s) Signed: 05/31/2021 6:28:42 PM By: RN, BSN Entered By: on 05/31/2021 09:02:57 -------------------------------------------------------------------------------- Encounter Discharge Information Details Patient Name: Date of Service: [name], [name] 05/31/2021 8:15 A M Medical Record Number: Patient Account Number: Date of Birth/Sex: Treating RN: 01/11/1946 (76 y.o. Primary Care Royale Lennartz: Nurse, adult Other Clinician: Referring Kanita Delage: Treating Dung Prien/Extender: Weeks in Treatment: 16 Encounter Discharge Information Items Discharge Condition: Stable Ambulatory Status: Ambulatory Discharge Destination: Home Transportation: Private Auto Accompanied By: self Schedule Follow-up Appointment: Yes Clinical Summary of Care: Patient Declined Electronic Signature(s) Signed: 05/31/2021 6:28:42 PM By: RN, BSN Entered By: on 05/31/2021 09:07:38 -------------------------------------------------------------------------------- Lower Extremity Assessment Details Patient Name: Date of Service: [name], Maycie 05/31/2021 8:15 A M Medical Record Number: Zenaida Deed Patient Account Number: 06/02/2021 Date of Birth/Sex: Treating RN: [birth year] (76 y.o. [id number] Primary Care Leshea Jaggers: 08/17/1945 Other Clinician: Referring Gabbriella Presswood: Treating Harshika Mago/Extender: 61 Weeks in Treatment: 16 Edema Assessment Assessed: [Left: No] [Right: No] Edema: [Left: Ye] [Right: s] Calf Left: Right: Point of Measurement: 33 cm From Medial Instep 36 cm Ankle Left: Right: Point of Measurement: 7 cm From Medial Instep 22.3 cm Vascular Assessment Pulses: Dorsalis Pedis Palpable: [Right:Yes] Electronic Signature(s) Signed: 05/31/2021 6:28:42 PM By: [name] RN, BSN Entered By: Gwyndolyn Kaufman on 05/31/2021 08:54:55 -------------------------------------------------------------------------------- Multi Wound Chart Details Patient Name: Date of Service: Zenaida Deed, Carlin 05/31/2021 8:15 A M Medical Record Number: 06/02/2021 Patient Account Number: 06/02/2021 Date of Birth/Sex: Treating RN: 18-May-1945 (76 y.o. F) Primary Care Shelda Truby: 08/17/1945 Other Clinician: Referring Brie Eppard: Treating Azana Kiesler/Extender: 61 Weeks in Treatment: 16 Vital Signs Height(in): 68 Pulse(bpm): 78 Weight(lbs): 200 Blood Pressure(mmHg): 144/79 Body Mass Index(BMI):  30.4 Temperature(F): 98.1 Respiratory Rate(breaths/min): 18 Photos: [N/A:N/A] Right, Lateral Lower Leg N/A N/A Wound Location: Trauma N/A N/A Wounding Event: Venous Leg Ulcer N/A N/A Primary Etiology: Osteoarthritis N/A N/A Comorbid History: 12/15/2020 N/A N/A Date Acquired: 16 N/A N/A Weeks of Treatment: Open N/A N/A Wound Status: No N/A N/A Wound Recurrence: 0x0x0 N/A N/A Measurements L x W x D (cm) 0 N/A N/A A (cm) : rea 0 N/A N/A Volume (cm) : 100.00% N/A N/A % Reduction in A rea: 100.00%  N/A N/A % Reduction in Volume: Full Thickness With Exposed Support N/A N/A Classification: Structures None Present N/A N/A Exudate Amount: None Present (0%) N/A N/A Granulation Amount: None Present (0%) N/A N/A Necrotic Amount: Fascia: No N/A N/A Exposed Structures: Fat Layer (Subcutaneous Tissue): No Tendon: No Muscle: No Joint: No Bone: No Large (67-100%) N/A N/A Epithelialization: Treatment Notes Electronic Signature(s) Signed: 05/31/2021 9:05:13 AM By: Duanne Guess MD FACS Entered By: Duanne Guess on 05/31/2021 09:05:13 -------------------------------------------------------------------------------- Multi-Disciplinary Care Plan Details Patient Name: Date of Service: [name], [name] 05/31/2021 8:15 A M Medical Record Number: [id number] Patient Account Number: [id number] Date of Birth/Sex: Treating RN: February 20, 1945 (76 y.o. Tommye Standard Primary Care Shawanda Sievert: [name] Other Clinician: Referring Natha Guin: Treating Verlie Liotta/Extender: Gwyndolyn Kaufman Weeks in Treatment: 16 Multidisciplinary Care Plan reviewed with physician Active Inactive Electronic Signature(s) Signed: 05/31/2021 6:28:42 PM By: Zenaida Deed RN, BSN Entered By: Zenaida Deed on 05/31/2021 08:59:58 -------------------------------------------------------------------------------- Pain Assessment Details Patient Name: Date of Service: [name], Guenevere  05/31/2021 8:15 A M Medical Record Number: [id number] Patient Account Number: [id number] Date of Birth/Sex: Treating RN: [birth year] (76 y.o. F) Primary Care Jobe Mutch: [name] Other Clinician: Referring Laporsha Grealish: Treating Masashi Snowdon/Extender: Gwyndolyn Kaufman Weeks in Treatment: 16 Active Problems Location of Pain Severity and Description of Pain Patient Has Paino No Site Locations Pain Management and Medication Current Pain Management: Electronic Signature(s) Signed: 06/01/2021 8:19:25 AM By: Karl Ito Entered By: Karl Ito on 05/31/2021 08:31:00 -------------------------------------------------------------------------------- Patient/Caregiver Education Details Patient Name: Date of Service: [name], [place] 5/17/2023andnbsp8:15 A M Medical Record Number: [id number] Patient Account Number: [id number] Date of Birth/Gender: Treating RN: 1945-06-05 (76 y.o. Tommye Standard Primary Care Physician: [name] Other Clinician: Referring Physician: Treating Physician/Extender: Ermalene Searing in Treatment: 16 Education Assessment Education Provided To: Patient Education Topics Provided Venous: Methods: Explain/Verbal Responses: Reinforcements needed, State content correctly Wound/Skin Impairment: Methods: Explain/Verbal Responses: Reinforcements needed, State content correctly Electronic Signature(s) Signed: 05/31/2021 6:28:42 PM By: Zenaida Deed RN, BSN Entered By: Zenaida Deed on 05/31/2021 09:00:26 -------------------------------------------------------------------------------- Wound Assessment Details Patient Name: Date of Service: [name], Emalee 05/31/2021 8:15 A M Medical Record Number: [id number] Patient Account Number: [id number] Date of Birth/Sex: Treating RN: October 15, 1945 (76 y.o. F) Primary Care Nimco Bivens: [name] Other Clinician: Referring Lavi Sheehan: Treating Zaveon Gillen/Extender: Gwyndolyn Kaufman Weeks in Treatment: 16 Wound Status Wound Number: 1 Primary Etiology: Venous Leg Ulcer Wound Location: Right, Lateral Lower Leg Wound Status: Open Wounding Event: Trauma Comorbid History: Osteoarthritis Date Acquired: 12/15/2020 Weeks Of Treatment: 16 Clustered Wound: No Photos Wound Measurements Length: (cm) Width: (cm) Depth: (cm) Area: (cm) Volume: (cm) 0 % Reduction in Area: 100% 0 % Reduction in Volume: 100% 0 Epithelialization: Large (67-100%) 0 Tunneling: No 0 Undermining: No Wound Description Classification: Full Thickness With Exposed Support Structures Exudate Amount: None Present Foul Odor After Cleansing: No Slough/Fibrino No Wound Bed Granulation Amount: None Present (0%) Exposed Structure Necrotic Amount: None Present (0%) Fascia Exposed: No Fat Layer (Subcutaneous Tissue) Exposed: No Tendon Exposed: No Muscle Exposed: No Joint Exposed: No Bone Exposed: No Electronic Signature(s) Signed: 05/31/2021 6:28:42 PM By: Zenaida Deed RN, BSN Entered By: Zenaida Deed on 05/31/2021 08:55:23 -------------------------------------------------------------------------------- Vitals Details Patient Name: Date of Service: [name], Frankye 05/31/2021 8:15 A M Medical Record Number: [id number] Patient Account Number: [id number] Date of Birth/Sex: Treating RN: 12/09/1945 (76 y.o. F) Primary Care Lametria Klunk: [name] Other Clinician: Referring Emre Stock: Treating Zabdiel Dripps/Extender: Gwyndolyn Kaufman Weeks in Treatment: 16 Vital Signs  Time Taken: 08:28 Temperature (F): 98.1 Height (in): 68 Pulse (bpm): 78 Weight (lbs): 200 Respiratory Rate (breaths/min): 18 Body Mass Index (BMI): 30.4 Blood Pressure (mmHg): 144/79 Reference Range: 80 - 120 mg / dl Electronic Signature(s) Signed: 06/01/2021 8:19:25 AM By: Karl Itoawkins, Destiny Entered By: Karl Itoawkins, Destiny on 05/31/2021 08:30:54

## 2021-06-07 ENCOUNTER — Encounter (HOSPITAL_BASED_OUTPATIENT_CLINIC_OR_DEPARTMENT_OTHER): Payer: Medicare Other | Admitting: General Surgery

## 2021-07-03 ENCOUNTER — Other Ambulatory Visit (HOSPITAL_COMMUNITY): Payer: Self-pay

## 2021-07-05 ENCOUNTER — Ambulatory Visit (HOSPITAL_COMMUNITY)
Admission: RE | Admit: 2021-07-05 | Discharge: 2021-07-05 | Disposition: A | Discharge status: Home / Self Care | Payer: Medicare Other | Source: Ambulatory Visit | Attending: Sports Medicine | Admitting: Sports Medicine

## 2021-07-05 DIAGNOSIS — M810 Age-related osteoporosis without current pathological fracture: Secondary | ICD-10-CM | POA: Diagnosis present

## 2021-07-05 MED ORDER — ZOLEDRONIC ACID 5 MG/100ML IV SOLN
INTRAVENOUS | Status: AC
  Filled 2021-07-05: qty 100

## 2021-07-05 MED ORDER — ZOLEDRONIC ACID 5 MG/100ML IV SOLN
5.0000 mg | Freq: Once | INTRAVENOUS | Status: AC
  Administered 2021-07-05: 5 mg via INTRAVENOUS

## 2021-07-27 ENCOUNTER — Ambulatory Visit (HOSPITAL_BASED_OUTPATIENT_CLINIC_OR_DEPARTMENT_OTHER): Payer: Medicare Other | Attending: Sports Medicine | Admitting: Physical Therapy

## 2021-07-27 ENCOUNTER — Encounter (HOSPITAL_BASED_OUTPATIENT_CLINIC_OR_DEPARTMENT_OTHER): Payer: Self-pay | Admitting: Physical Therapy

## 2021-07-27 DIAGNOSIS — M5416 Radiculopathy, lumbar region: Secondary | ICD-10-CM | POA: Diagnosis not present

## 2021-07-27 DIAGNOSIS — M5459 Other low back pain: Secondary | ICD-10-CM | POA: Insufficient documentation

## 2021-07-27 DIAGNOSIS — R2689 Other abnormalities of gait and mobility: Secondary | ICD-10-CM | POA: Diagnosis not present

## 2021-07-27 NOTE — Therapy (Signed)
OUTPATIENT PHYSICAL THERAPY THORACOLUMBAR EVALUATION   Patient Name: [name] MRN: [id number] DOB:[birth year], 76 y.o., female Today's Date: 07/28/2021   PT End of Session - 07/27/21 1535     Visit Number 1    Number of Visits 16    Date for PT Re-Evaluation 09/21/21    PT Start Time 0845    PT Stop Time 0928    PT Time Calculation (min) 43 min    Activity Tolerance Patient tolerated treatment well    Behavior During Therapy [facility] for tasks assessed/performed             Past Medical History:  Diagnosis Date   Arthritis    Psoriasis    Intermittent; all over body and waxes and wanes   Tinnitus of right ear 2013   Intermittent; Melatonin has helped   Vaginal delivery 1973, 1981   Past Surgical History:  Procedure Laterality Date   goiter  1968   was on her Thyroid   IR KYPHO THORACIC WITH BONE BIOPSY  01/20/2019   JOINT REPLACEMENT     Knee replacement Right 2019   TOTAL HIP ARTHROPLASTY     Lt 2002, Rt 2005   VEIN LIGATION AND STRIPPING Right 2005   Patient Active Problem List   Diagnosis Date Noted   Hallux valgus, acquired 07/01/2019   Abnormal EKG 01/20/2019   Tinnitus of right ear    Psoriasis    Arthritis    Osteopenia 01/05/2019     PCP: [name], PA   REFERRING PROVIDER: Melina Fiddler, MD   REFERRING DIAG: pain, radiculopathy   Rationale for Evaluation and Treatment Rehabilitation   THERAPY DIAG:  No diagnosis found.   ONSET DATE: December 2022   SUBJECTIVE:                                                                                                                                                                                            SUBJECTIVE STATEMENT: Patient presents today with onset of low back pain secondary to a fall that occurred in December 2022. Patient states she got her feet twisted and fell. She had a deep wound on her lateral R leg as a result that has just recently closed fully. Since fall, she has noted a  decline in activity and exercise that she feels has contributed to her back pain. She has some numbness over the R knee (pt attributes to TKA) and has occasional radicular pain in her L posterior hip and thigh.      PERTINENT HISTORY:  Bil THA, R TKA, osteopenia   PAIN:  Are you having pain?  Yes: NPRS scale: 8/10 Pain location: Back, occasional radiating L LE Pain description: General aching Aggravating factors: Activity, bending Relieving factors: Rest, tylenol     PRECAUTIONS: None   WEIGHT BEARING RESTRICTIONS No   FALLS:  Has patient fallen in last 6 months? No   LIVING ENVIRONMENT: Lives with: lives with their spouse Lives in: House/apartment Stairs: Yes: External: 3 steps   OCCUPATION: Retired   PLOF: Independent   PATIENT GOALS   Improve balance and walking, be able to garden and do yard work     OBJECTIVE:    DIAGNOSTIC FINDINGS:   Left knee x-ray: 2022  EXAM: LEFT KNEE - COMPLETE 4+ VIEW   COMPARISON:  None.   FINDINGS: No evidence of fracture, dislocation, or joint effusion. There is tricompartmental moderate joint space narrowing and osteophytes. No focal bone lesion. Regional soft tissues are unremarkable.   IMPRESSION: 1. No acute findings in the left knee. 2. Moderate tricompartmental osteoarthritis   PATIENT SURVEYS:  FOTO 47% baseline 46 percent expected din 1 visit    SCREENING FOR RED FLAGS: Bowel or bladder incontinence: No Spinal tumors: No Cauda equina syndrome: No Compression fracture: No Abdominal aneurysm: No   COGNITION:           Overall cognitive status: Within functional limits for tasks assessed                          SENSATION: WFL   POSTURE: rounded shoulders; stands with forward trunk flexion    PALPATION:    LUMBAR ROM:    Active  AROM  eval  Flexion No significant limitation    Extension 50% limited but no pain    Right lateral rotation Pain 25% limited    Left lateral rotation  No pain    Right  rotation    Left rotation     (Blank rows = not tested)   LOWER EXTREMITY ROM:      MMT Right eval Left eval  Hip flexion 20.6 23.6  Hip extension      Hip abduction 41.9 39.5  Hip adduction      Hip internal rotation      Hip external rotation      Knee flexion      Knee extension 37.1 35.0  Ankle dorsiflexion 33.3 35.4  Ankle plantarflexion      Ankle inversion      Ankle eversion       (Blank rows = not tested)   LOWER EXTREMITY MMT:     Active motion  Right eval Left eval  Hip flexion 90 pain full 90 painful   Hip extension      Hip abduction      Hip adduction      Hip internal rotation      Hip external rotation      Knee flexion      Knee extension      Ankle dorsiflexion      Ankle plantarflexion      Ankle inversion      Ankle eversion       (Blank rows = not tested)   LUMBAR SPECIAL TESTS:  Straight leg raise test: Negative   FUNCTIONAL TESTS:  Berg Balance Scale: deferred to next visit   GAIT: Distance walked: flexed trunk  lateral movement noted       TODAY'S TREATMENT  Eval  Exercises - Supine Quad Set  - 1 x daily - 7 x  weekly - 3 sets - 10 reps - Active Straight Leg Raise with Quad Set  - 1 x daily - 7 x weekly - 3 sets - 10 reps - Supine Heel Slides  - 1 x daily - 7 x weekly - 3 sets - 10 reps       PATIENT EDUCATION:  Education details: reviewed HEP and symptom mangement  Person educated: Patient Education method: Explanation handout  Education comprehension: verbalized understanding     HOME EXERCISE PROGRAM: Access Code: [email or web address] URL: [email or web address] Date: 07/27/2021 Prepared by: Lorayne Bender  Exercises - Supine Quad Set  - 1 x daily - 7 x weekly - 3 sets - 10 reps - Active Straight Leg Raise with Quad Set  - 1 x daily - 7 x weekly - 3 sets - 10 reps - Supine Heel Slides  - 1 x daily - 7 x weekly - 3 sets - 10 reps   ASSESSMENT:   CLINICAL IMPRESSION: Patient is a 76 y.o. F who was seen today  for physical therapy evaluation and treatment for low back pain. She has limited lumbar extension and limitations in rotation. She has pain with hip flexion passed 90 degrees. Her standing time before pain at this time is > . She would benefit from skilled therapy in the land and water to improve strength and functional ability.      OBJECTIVE IMPAIRMENTS decreased activity tolerance, difficulty walking, decreased ROM, decreased strength, and increased muscle spasms.    ACTIVITY LIMITATIONS lifting, bending, standing, and stairs   PARTICIPATION LIMITATIONS: community activity and yard work   PERSONAL FACTORS Age, Fitness, Past/current experiences, and Time since onset of injury/illness/exacerbation are also affecting patient's functional outcome.    REHAB POTENTIAL: Good   CLINICAL DECISION MAKING: Evolving/moderate complexity declining ability to stand   EVALUATION COMPLEXITY: Moderate     GOALS: Goals reviewed with patient? No   SHORT TERM GOALS: Target date: 08/24/2021   Patient will increase bilateral hip flexion to 105 degrees without pain  Baseline: Goal status: INITIAL   2.  Patient will increase gross bilateral leg strength by 5 lbs  Baseline:  Goal status: INITIAL   3.  Patient will be independent with basic HEP on land and water  Baseline:  Goal status: INITIAL   LONG TERM GOALS: Target date: 09/21/2021   Patient will go up and down stairs to enter her house without pain  Baseline:  Goal status: INITIAL   2.  Patient will return to walking >1 mile without pain for exercise  Baseline:  Goal status: INITIAL   3.  Patient will have a comprehensive stretching/ land and water strengthening  program  Baseline:  Goal status: INITIAL   PLAN: PT FREQUENCY: 2x/week   PT DURATION: 8 weeks   PLANNED INTERVENTIONS: Therapeutic exercises, Therapeutic activity, Neuromuscular re-education, Balance training, Gait training, Patient/Family education, Self Care, Joint  mobilization, Aquatic Therapy, Dry Needling, Electrical stimulation, Cryotherapy, Moist heat, Ionotophoresis 4mg /ml Dexamethasone, and Manual therapy.   PLAN FOR NEXT SESSION: In the pool work on gait and LE strengthening; the patients ability to stand for periods of time is limited (>5 min ) Assess her standing time in the pool. Work on core and hip strengthening; On land manual therapy to lumbar spine. Pain is higher int he high lumbar ow thoracic area.      , Student-PT 07/27/2021, 8:46 AM   07/29/2021, PT 07/28/2021, 8:34 AM   During this treatment session, the therapist was present,  participating in and directing the treatment.

## 2021-07-27 NOTE — Therapy (Incomplete)
OUTPATIENT PHYSICAL THERAPY THORACOLUMBAR EVALUATION   Patient Name: [name] MRN: [id number] DOB:03-Nov-1945, 76 y.o., female Today's Date: 07/27/2021    Past Medical History:  Diagnosis Date   Arthritis    Psoriasis    Intermittent; all over body and waxes and wanes   Tinnitus of right ear 2013   Intermittent; Melatonin has helped   Vaginal delivery 1973, 1981   Past Surgical History:  Procedure Laterality Date   goiter  1968   was on her Thyroid   IR KYPHO THORACIC WITH BONE BIOPSY  01/20/2019   JOINT REPLACEMENT     Knee replacement Right 2019   TOTAL HIP ARTHROPLASTY     Lt 2002, Rt 2005   VEIN LIGATION AND STRIPPING Right 2005   Patient Active Problem List   Diagnosis Date Noted   Hallux valgus, acquired 07/01/2019   Abnormal EKG 01/20/2019   Tinnitus of right ear    Psoriasis    Arthritis    Osteopenia 01/05/2019    PCP: [name], PA  REFERRING PROVIDER: Melina Fiddler, MD  REFERRING DIAG: pain, radiculopathy  Rationale for Evaluation and Treatment Rehabilitation  THERAPY DIAG:  No diagnosis found.  ONSET DATE: December 2022  SUBJECTIVE:                                                                                                                                                                                           SUBJECTIVE STATEMENT: Patient presents today with onset of low back pain secondary to a fall that occurred in December 2022. Patient states she got her feet twisted and fell. She had a deep wound on her lateral R leg as a result that has just recently closed fully. Since fall, she has noted a decline in activity and exercise that she feels has contributed to her back pain. She has some numbness over the R knee (pt attributes to TKA) and has occasional radicular pain in her L posterior hip and thigh.    PERTINENT HISTORY:  Bil THA, R TKA, osteopenia  PAIN:  Are you having pain? Yes: NPRS scale: 8/10 Pain location: Back,  occasional radiating L LE Pain description: General aching Aggravating factors: Activity, bending Relieving factors: Rest, tylenol   PRECAUTIONS: None  WEIGHT BEARING RESTRICTIONS No  FALLS:  Has patient fallen in last 6 months? No  LIVING ENVIRONMENT: Lives with: lives with their spouse Lives in: House/apartment Stairs: Yes: External: 3 steps  OCCUPATION: Retired  PLOF: Independent  PATIENT GOALS   Improve balance and walking, be able to garden and do yard work   OBJECTIVE:   DIAGNOSTIC FINDINGS:  ***  PATIENT SURVEYS:  FOTO ***  SCREENING FOR RED FLAGS: Bowel or bladder incontinence: No Spinal tumors: No Cauda equina syndrome: No Compression fracture: No Abdominal aneurysm: No  COGNITION:  Overall cognitive status: Within functional limits for tasks assessed     SENSATION: WFL  POSTURE: {posture:25561}  PALPATION: ***  LUMBAR ROM:   Active  AROM  eval  Flexion   Extension   Right lateral flexion   Left lateral flexion   Right rotation   Left rotation    (Blank rows = not tested)  LOWER EXTREMITY ROM:     MMT Right eval Left eval  Hip flexion 20.6 23.6  Hip extension    Hip abduction 41.9 39.5  Hip adduction    Hip internal rotation    Hip external rotation    Knee flexion    Knee extension 37.1 35.0  Ankle dorsiflexion 33.3 35.4  Ankle plantarflexion    Ankle inversion    Ankle eversion     (Blank rows = not tested)  LOWER EXTREMITY MMT:    Active motion  Right eval Left eval  Hip flexion 90 pain full 90 painful   Hip extension    Hip abduction    Hip adduction    Hip internal rotation    Hip external rotation    Knee flexion    Knee extension    Ankle dorsiflexion    Ankle plantarflexion    Ankle inversion    Ankle eversion     (Blank rows = not tested)  LUMBAR SPECIAL TESTS:  Straight leg raise test: Negative  FUNCTIONAL TESTS:  Berg Balance Scale: deferred to next visit  GAIT: Distance walked:  *** Assistive device utilized: {Assistive devices:23999} Level of assistance: {Levels of assistance:24026} Comments: ***    TODAY'S TREATMENT  Eval     PATIENT EDUCATION:  Education details: *** Person educated: Patient Education method: Explanation Education comprehension: verbalized understanding   HOME EXERCISE PROGRAM: ***  ASSESSMENT:  CLINICAL IMPRESSION: Patient is a 76 y.o. F who was seen today for physical therapy evaluation and treatment for low back pain.    OBJECTIVE IMPAIRMENTS decreased activity tolerance, difficulty walking, decreased ROM, decreased strength, and increased muscle spasms.   ACTIVITY LIMITATIONS lifting, bending, standing, and stairs  PARTICIPATION LIMITATIONS: community activity and yard work  PERSONAL FACTORS Age, Fitness, Past/current experiences, and Time since onset of injury/illness/exacerbation are also affecting patient's functional outcome.   REHAB POTENTIAL: Good  CLINICAL DECISION MAKING: Evolving/moderate complexity  EVALUATION COMPLEXITY: Moderate   GOALS: Goals reviewed with patient? No  SHORT TERM GOALS: Target date: {:PHR,WPLUS3}  *** Baseline: Goal status: {GOALSTATUS:25110}  2.  *** Baseline:  Goal status: {GOALSTATUS:25110}  3.  *** Baseline:  Goal status: {GOALSTATUS:25110}  4.  *** Baseline:  Goal status: {GOALSTATUS:25110}  5.  *** Baseline:  Goal status: {GOALSTATUS:25110}  6.  *** Baseline:  Goal status: {GOALSTATUS:25110}  LONG TERM GOALS: Target date: {:PHR,WPLUS6}  *** Baseline:  Goal status: {GOALSTATUS:25110}  2.  *** Baseline:  Goal status: {GOALSTATUS:25110}  3.  *** Baseline:  Goal status: {GOALSTATUS:25110}  4.  *** Baseline:  Goal status: {GOALSTATUS:25110}  5.  *** Baseline:  Goal status: {GOALSTATUS:25110}  6.  *** Baseline:  Goal status: {GOALSTATUS:25110}   PLAN: PT FREQUENCY: {rehab frequency:25116}  PT DURATION: 6 weeks  PLANNED INTERVENTIONS:  Therapeutic exercises, Therapeutic activity, Neuromuscular re-education, Balance training, Gait training, Patient/Family education, Self Care, Joint mobilization, Aquatic Therapy, Dry Needling, Electrical stimulation, Cryotherapy, Moist heat, Ionotophoresis 4mg /ml Dexamethasone, and Manual  therapy.  PLAN FOR NEXT SESSION: Lenell Antu, Student-PT 07/27/2021, 8:46 AM

## 2021-07-31 ENCOUNTER — Encounter (HOSPITAL_BASED_OUTPATIENT_CLINIC_OR_DEPARTMENT_OTHER): Payer: Self-pay | Admitting: Physical Therapy

## 2021-07-31 ENCOUNTER — Ambulatory Visit (HOSPITAL_BASED_OUTPATIENT_CLINIC_OR_DEPARTMENT_OTHER): Payer: Medicare Other | Admitting: Physical Therapy

## 2021-07-31 DIAGNOSIS — M5416 Radiculopathy, lumbar region: Secondary | ICD-10-CM

## 2021-07-31 DIAGNOSIS — R2689 Other abnormalities of gait and mobility: Secondary | ICD-10-CM

## 2021-07-31 DIAGNOSIS — M5459 Other low back pain: Secondary | ICD-10-CM | POA: Diagnosis not present

## 2021-07-31 NOTE — Therapy (Signed)
OUTPATIENT PHYSICAL THERAPY THORACOLUMBAR    Patient Name: [name] MRN: [id number] DOB:1945/06/25, 76 y.o., female Today's Date: 07/31/2021   PT End of Session - 07/31/21 1109     Visit Number 2    Number of Visits 16    Date for PT Re-Evaluation 09/21/21    Progress Note Due on Visit 10    PT Start Time 1116    PT Stop Time 1200    PT Time Calculation (min) 44 min    Activity Tolerance Patient tolerated treatment well    Behavior During Therapy WFL for tasks assessed/performed             Past Medical History:  Diagnosis Date   Arthritis    Psoriasis    Intermittent; all over body and waxes and wanes   Tinnitus of right ear 2013   Intermittent; Melatonin has helped   Vaginal delivery 1973, 1981   Past Surgical History:  Procedure Laterality Date   goiter  1968   was on her Thyroid   IR KYPHO THORACIC WITH BONE BIOPSY  01/20/2019   JOINT REPLACEMENT     Knee replacement Right 2019   TOTAL HIP ARTHROPLASTY     Lt 2002, Rt 2005   VEIN LIGATION AND STRIPPING Right 2005   Patient Active Problem List   Diagnosis Date Noted   Hallux valgus, acquired 07/01/2019   Abnormal EKG 01/20/2019   Tinnitus of right ear    Psoriasis    Arthritis    Osteopenia 01/05/2019     PCP: [name], PA   REFERRING PROVIDER: Melina Fiddler, MD   REFERRING DIAG: pain, radiculopathy   Rationale for Evaluation and Treatment Rehabilitation   THERAPY DIAG:  No diagnosis found.   ONSET DATE: December 2022   SUBJECTIVE:                                                                                                                                                                                            SUBJECTIVE STATEMENT: Patient presents today with onset of low back pain secondary to a fall that occurred in December 2022. Patient states she got her feet twisted and fell. She had a deep wound on her lateral R leg as a result that has just recently closed fully.  Since fall, she has noted a decline in activity and exercise that she feels has contributed to her back pain. She has some numbness over the R knee (pt attributes to TKA) and has occasional radicular pain in her L posterior hip and thigh.      PERTINENT HISTORY:  Bil THA, R TKA,  osteopenia   PAIN:  Are you having pain? Yes: NPRS scale: 1/10 Pain location: Back, occasional radiating L LE Pain description: General aching Aggravating factors: Activity, bending Relieving factors: Rest, tylenol     PRECAUTIONS: None   WEIGHT BEARING RESTRICTIONS No   FALLS:  Has patient fallen in last 6 months? No   LIVING ENVIRONMENT: Lives with: lives with their spouse Lives in: House/apartment Stairs: Yes: External: 3 steps   OCCUPATION: Retired   PLOF: Independent   PATIENT GOALS   Improve balance and walking, be able to garden and do yard work     OBJECTIVE:    DIAGNOSTIC FINDINGS:   Left knee x-ray: 2022  EXAM: LEFT KNEE - COMPLETE 4+ VIEW   COMPARISON:  None.   FINDINGS: No evidence of fracture, dislocation, or joint effusion. There is tricompartmental moderate joint space narrowing and osteophytes. No focal bone lesion. Regional soft tissues are unremarkable.   IMPRESSION: 1. No acute findings in the left knee. 2. Moderate tricompartmental osteoarthritis   PATIENT SURVEYS:  FOTO 47% baseline 46 percent expected din 1 visit    SCREENING FOR RED FLAGS: Bowel or bladder incontinence: No Spinal tumors: No Cauda equina syndrome: No Compression fracture: No Abdominal aneurysm: No   COGNITION:           Overall cognitive status: Within functional limits for tasks assessed                          SENSATION: WFL   POSTURE: rounded shoulders; stands with forward trunk flexion    PALPATION:    LUMBAR ROM:    Active  AROM  eval  Flexion No significant limitation    Extension 50% limited but no pain    Right lateral rotation Pain 25% limited    Left lateral  rotation  No pain    Right rotation    Left rotation     (Blank rows = not tested)   LOWER EXTREMITY ROM:      MMT Right eval Left eval  Hip flexion 20.6 23.6  Hip extension      Hip abduction 41.9 39.5  Hip adduction      Hip internal rotation      Hip external rotation      Knee flexion      Knee extension 37.1 35.0  Ankle dorsiflexion 33.3 35.4  Ankle plantarflexion      Ankle inversion      Ankle eversion       (Blank rows = not tested)   LOWER EXTREMITY MMT:     Active motion  Right eval Left eval  Hip flexion 90 pain full 90 painful   Hip extension      Hip abduction      Hip adduction      Hip internal rotation      Hip external rotation      Knee flexion      Knee extension      Ankle dorsiflexion      Ankle plantarflexion      Ankle inversion      Ankle eversion       (Blank rows = not tested)   LUMBAR SPECIAL TESTS:  Straight leg raise test: Negative   FUNCTIONAL TESTS:  Berg Balance Scale: deferred to next visit   GAIT: Distance walked: flexed trunk  lateral movement noted       TODAY'S TREATMENT  Pt seen for aquatic therapy today.  Treatment took place in water 3.25-4.5 ft in depth at the [facility] pool. Temp of water was 92.  Pt entered/exited the pool via stairs/step to pattern with 2 hand rails rail. Intro to setting  Walking  Standing holding wall 4 ft: df; pf; add/abd; hip extension Stretching holding to wall 3 ft LB Stretching Lumbar rotation R/L using kick board Kick board push pull. 2x20. Cues for abdominal bracing STS from 3rd step 2x5. Moderate cues for execution, proper body mechanics Steps ups bottom step leading R/L x10 Straddling noodle: cycling; scissor; skiing    Pt requires the buoyancy and hydrostatic pressure of water for support, and to offload joints by unweighting joint load by at least 50 % in naval deep water and by at least 75-80% in chest to neck deep water.  Viscosity of the water is needed for  resistance of strengthening. Water current perturbations provides challenge to standing balance requiring increased core activation.       PATIENT EDUCATION:  Education details: reviewed HEP and symptom mangement  Person educated: Patient Education method: Explanation handout  Education comprehension: verbalized understanding     HOME EXERCISE PROGRAM: Access Code: [email or web address] URL: [email or web address] Date: 07/27/2021 Prepared by: Lorayne Bender  Exercises - Supine Quad Set  - 1 x daily - 7 x weekly - 3 sets - 10 reps - Active Straight Leg Raise with Quad Set  - 1 x daily - 7 x weekly - 3 sets - 10 reps - Supine Heel Slides  - 1 x daily - 7 x weekly - 3 sets - 10 reps   ASSESSMENT:   CLINICAL IMPRESSION: Pt is safe and indep submerged. Pt with somber demeanor throughout session.  She reports decreased/limited pain in LB throughout, minor discomfort in left knee. She is given 2 seated rest periods throughout with ~ 20 mins of continuous standing time.  She is coordinated with exercises in deep water as she has taken deep water aerobic classes in past. Pt edu on posture, proper body mechanics, importance of core strength.  She is a good candidate for aquatic rehab using the benefits of water to facilitate and hastened progression towards goals.  Patient is a 76 y.o. F who was seen today for physical therapy evaluation and treatment for low back pain. She has limited lumbar extension and limitations in rotation. She has pain with hip flexion passed 90 degrees. Her standing time before pain at this time is > . She would benefit from skilled therapy in the land and water to improve strength and functional ability.      OBJECTIVE IMPAIRMENTS decreased activity tolerance, difficulty walking, decreased ROM, decreased strength, and increased muscle spasms.    ACTIVITY LIMITATIONS lifting, bending, standing, and stairs   PARTICIPATION LIMITATIONS: community activity and yard  work   PERSONAL FACTORS Age, Fitness, Past/current experiences, and Time since onset of injury/illness/exacerbation are also affecting patient's functional outcome.    REHAB POTENTIAL: Good   CLINICAL DECISION MAKING: Evolving/moderate complexity declining ability to stand   EVALUATION COMPLEXITY: Moderate     GOALS: Goals reviewed with patient? No   SHORT TERM GOALS: Target date: 08/24/2021   Patient will increase bilateral hip flexion to 105 degrees without pain  Baseline: Goal status: INITIAL   2.  Patient will increase gross bilateral leg strength by 5 lbs  Baseline:  Goal status: INITIAL   3.  Patient will be independent with basic HEP on land and water  Baseline:  Goal status: INITIAL   LONG  TERM GOALS: Target date: 09/21/2021   Patient will go up and down stairs to enter her house without pain  Baseline:  Goal status: INITIAL   2.  Patient will return to walking >1 mile without pain for exercise  Baseline:  Goal status: INITIAL   3.  Patient will have a comprehensive stretching/ land and water strengthening  program  Baseline:  Goal status: INITIAL   PLAN: PT FREQUENCY: 2x/week   PT DURATION: 8 weeks   PLANNED INTERVENTIONS: Therapeutic exercises, Therapeutic activity, Neuromuscular re-education, Balance training, Gait training, Patient/Family education, Self Care, Joint mobilization, Aquatic Therapy, Dry Needling, Electrical stimulation, Cryotherapy, Moist heat, Ionotophoresis 4mg /ml Dexamethasone, and Manual therapy.   PLAN FOR NEXT SESSION: In the pool work on gait and LE strengthening; the patients ability to stand for periods of time is limited (>5 min ) Assess her standing time in the pool. Work on core and hip strengthening; On land manual therapy to lumbar spine. Pain is higher int he high lumbar ow thoracic area.        , PT MPT 07/31/2021, 11:11 AM

## 2021-08-02 ENCOUNTER — Ambulatory Visit (HOSPITAL_BASED_OUTPATIENT_CLINIC_OR_DEPARTMENT_OTHER): Payer: Medicare Other | Admitting: Physical Therapy

## 2021-08-02 DIAGNOSIS — M5459 Other low back pain: Secondary | ICD-10-CM

## 2021-08-02 DIAGNOSIS — R2689 Other abnormalities of gait and mobility: Secondary | ICD-10-CM

## 2021-08-02 DIAGNOSIS — M5416 Radiculopathy, lumbar region: Secondary | ICD-10-CM

## 2021-08-02 NOTE — Therapy (Incomplete)
OUTPATIENT PHYSICAL THERAPY THORACOLUMBAR    Patient Name: [name] MRN: [id number] DOB:1945-08-06, 76 y.o., female Today's Date: 08/02/2021     Past Medical History:  Diagnosis Date   Arthritis    Psoriasis    Intermittent; all over body and waxes and wanes   Tinnitus of right ear 2013   Intermittent; Melatonin has helped   Vaginal delivery 1973, 1981   Past Surgical History:  Procedure Laterality Date   goiter  1968   was on her Thyroid   IR KYPHO THORACIC WITH BONE BIOPSY  01/20/2019   JOINT REPLACEMENT     Knee replacement Right 2019   TOTAL HIP ARTHROPLASTY     Lt 2002, Rt 2005   VEIN LIGATION AND STRIPPING Right 2005   Patient Active Problem List   Diagnosis Date Noted   Hallux valgus, acquired 07/01/2019   Abnormal EKG 01/20/2019   Tinnitus of right ear    Psoriasis    Arthritis    Osteopenia 01/05/2019     PCP: [name], PA   REFERRING PROVIDER: Melina Fiddler, MD   REFERRING DIAG: pain, radiculopathy   Rationale for Evaluation and Treatment Rehabilitation   THERAPY DIAG:  No diagnosis found.   ONSET DATE: December 2022   SUBJECTIVE:                                                                                                                                                                                            SUBJECTIVE STATEMENT: Patient reports her back is feeling about the same today. Her knees have been more sore since session prior. She has been compliant with home exercises. She has family coming into town soon and will have a break from scheduled PT.    PERTINENT HISTORY:  Bil THA, R TKA, osteopenia   PAIN:  Are you having pain? Yes: NPRS scale: 1/10 Pain location: Back, occasional radiating L LE Pain description: General aching Aggravating factors: Activity, bending Relieving factors: Rest, tylenol     PRECAUTIONS: None   WEIGHT BEARING RESTRICTIONS No   FALLS:  Has patient fallen in last 6 months? No    LIVING ENVIRONMENT: Lives with: lives with their spouse Lives in: House/apartment Stairs: Yes: External: 3 steps   OCCUPATION: Retired   PLOF: Independent   PATIENT GOALS   Improve balance and walking, be able to garden and do yard work     OBJECTIVE:    DIAGNOSTIC FINDINGS:   Left knee x-ray: 2022  EXAM: LEFT KNEE - COMPLETE 4+ VIEW   COMPARISON:  None.   FINDINGS: No evidence of fracture, dislocation, or joint effusion. There  is tricompartmental moderate joint space narrowing and osteophytes. No focal bone lesion. Regional soft tissues are unremarkable.   IMPRESSION: 1. No acute findings in the left knee. 2. Moderate tricompartmental osteoarthritis   PATIENT SURVEYS:  FOTO 47% baseline 46 percent expected din 1 visit    SCREENING FOR RED FLAGS: Bowel or bladder incontinence: No Spinal tumors: No Cauda equina syndrome: No Compression fracture: No Abdominal aneurysm: No   COGNITION:           Overall cognitive status: Within functional limits for tasks assessed                          SENSATION: WFL   POSTURE: rounded shoulders; stands with forward trunk flexion    PALPATION:    LUMBAR ROM:    Active  AROM  eval  Flexion No significant limitation    Extension 50% limited but no pain    Right lateral rotation Pain 25% limited    Left lateral rotation  No pain    Right rotation    Left rotation     (Blank rows = not tested)   LOWER EXTREMITY ROM:      MMT Right eval Left eval  Hip flexion 20.6 23.6  Hip extension      Hip abduction 41.9 39.5  Hip adduction      Hip internal rotation      Hip external rotation      Knee flexion      Knee extension 37.1 35.0  Ankle dorsiflexion 33.3 35.4  Ankle plantarflexion      Ankle inversion      Ankle eversion       (Blank rows = not tested)   LOWER EXTREMITY MMT:     Active motion  Right eval Left eval  Hip flexion 90 pain full 90 painful   Hip extension      Hip abduction      Hip  adduction      Hip internal rotation      Hip external rotation      Knee flexion      Knee extension      Ankle dorsiflexion      Ankle plantarflexion      Ankle inversion      Ankle eversion       (Blank rows = not tested)   LUMBAR SPECIAL TESTS:  Straight leg raise test: Negative   FUNCTIONAL TESTS:  Berg Balance Scale: deferred to next visit   GAIT: Distance walked: flexed trunk  lateral movement noted       TODAY'S TREATMENT   7/19 Nu-Step: 5 min, L3 for warmup Manual: STM to bil lumbar paraspinals and glute max/med. Grade III CPA/UPA T12-L4 SLR 2x10 Bridging 2x10 LAQ 2x10 3lb Side-stepping x3 laps   7/17 Pt seen for aquatic therapy today.  Treatment took place in water 3.25-4.5 ft in depth at the [facility] pool. Temp of water was 92.  Pt entered/exited the pool via stairs/step to pattern with 2 hand rails rail. Intro to setting  Walking  Standing holding wall 4 ft: df; pf; add/abd; hip extension Stretching holding to wall 3 ft LB Stretching Lumbar rotation R/L using kick board Kick board push pull. 2x20. Cues for abdominal bracing STS from 3rd step 2x5. Moderate cues for execution, proper body mechanics Steps ups bottom step leading R/L x10 Straddling noodle: cycling; scissor; skiing    Pt requires the buoyancy and hydrostatic pressure  of water for support, and to offload joints by unweighting joint load by at least 50 % in naval deep water and by at least 75-80% in chest to neck deep water.  Viscosity of the water is needed for resistance of strengthening. Water current perturbations provides challenge to standing balance requiring increased core activation.       PATIENT EDUCATION:  Education details: reviewed HEP and symptom mangement  Person educated: Patient Education method: Explanation handout  Education comprehension: verbalized understanding     HOME EXERCISE PROGRAM: Access Code: [email or web address] URL:  [email or web address] Date: 07/27/2021 Prepared by: Lorayne Bender  Exercises - Supine Quad Set  - 1 x daily - 7 x weekly - 3 sets - 10 reps - Active Straight Leg Raise with Quad Set  - 1 x daily - 7 x weekly - 3 sets - 10 reps - Supine Heel Slides  - 1 x daily - 7 x weekly - 3 sets - 10 reps   ASSESSMENT:   CLINICAL IMPRESSION: Patient is making fair progress overall. She reports her back pain is the same low-level pain. She tolerates exercises with fair ability; reports no increase in pain. She is able to demonstrate good hip stability with verbal cueing during exercise.     OBJECTIVE IMPAIRMENTS decreased activity tolerance, difficulty walking, decreased ROM, decreased strength, and increased muscle spasms.    ACTIVITY LIMITATIONS lifting, bending, standing, and stairs   PARTICIPATION LIMITATIONS: community activity and yard work   PERSONAL FACTORS Age, Fitness, Past/current experiences, and Time since onset of injury/illness/exacerbation are also affecting patient's functional outcome.    REHAB POTENTIAL: Good   CLINICAL DECISION MAKING: Evolving/moderate complexity declining ability to stand   EVALUATION COMPLEXITY: Moderate     GOALS: Goals reviewed with patient? No   SHORT TERM GOALS: Target date: 08/24/2021   Patient will increase bilateral hip flexion to 105 degrees without pain  Baseline: Goal status: INITIAL   2.  Patient will increase gross bilateral leg strength by 5 lbs  Baseline:  Goal status: INITIAL   3.  Patient will be independent with basic HEP on land and water  Baseline:  Goal status: INITIAL   LONG TERM GOALS: Target date: 09/21/2021   Patient will go up and down stairs to enter her house without pain  Baseline:  Goal status: INITIAL   2.  Patient will return to walking >1 mile without pain for exercise  Baseline:  Goal status: INITIAL   3.  Patient will have a comprehensive stretching/ land and water strengthening  program   Baseline:  Goal status: INITIAL   PLAN: PT FREQUENCY: 2x/week   PT DURATION: 8 weeks   PLANNED INTERVENTIONS: Therapeutic exercises, Therapeutic activity, Neuromuscular re-education, Balance training, Gait training, Patient/Family education, Self Care, Joint mobilization, Aquatic Therapy, Dry Needling, Electrical stimulation, Cryotherapy, Moist heat, Ionotophoresis 4mg /ml Dexamethasone, and Manual therapy.   PLAN FOR NEXT SESSION: In the pool work on gait and LE strengthening; the patients ability to stand for periods of time is limited (>5 min ) Assess her standing time in the pool. Work on core and hip strengthening; On land manual therapy to lumbar spine. Pain is higher int he high lumbar ow thoracic area.        , Student-PT 08/02/2021, 1:07 PM    During this treatment session, the therapist was present, participating in and directing the treatment.

## 2021-08-03 NOTE — Therapy (Signed)
OUTPATIENT PHYSICAL THERAPY THORACOLUMBAR    Patient Name: [name] MRN: [id number] DOB:20-Jan-1945, 76 y.o., female Today's Date: 08/03/2021   PT End of Session - 08/03/21 1039     Visit Number 3    Number of Visits 16    Date for PT Re-Evaluation 09/21/21    Progress Note Due on Visit 10    PT Start Time 1303    PT Stop Time 1345    PT Time Calculation (min) 42 min    Activity Tolerance Patient tolerated treatment well    Behavior During Therapy WFL for tasks assessed/performed             Past Medical History:  Diagnosis Date   Arthritis    Psoriasis    Intermittent; all over body and waxes and wanes   Tinnitus of right ear 2013   Intermittent; Melatonin has helped   Vaginal delivery 1973, 1981   Past Surgical History:  Procedure Laterality Date   goiter  1968   was on her Thyroid   IR KYPHO THORACIC WITH BONE BIOPSY  01/20/2019   JOINT REPLACEMENT     Knee replacement Right 2019   TOTAL HIP ARTHROPLASTY     Lt 2002, Rt 2005   VEIN LIGATION AND STRIPPING Right 2005   Patient Active Problem List   Diagnosis Date Noted   Hallux valgus, acquired 07/01/2019   Abnormal EKG 01/20/2019   Tinnitus of right ear    Psoriasis    Arthritis    Osteopenia 01/05/2019      PCP: [name], PA   REFERRING PROVIDER: Melina Fiddler, MD   REFERRING DIAG: pain, radiculopathy   Rationale for Evaluation and Treatment Rehabilitation   THERAPY DIAG:  No diagnosis found.   ONSET DATE: December 2022   SUBJECTIVE:                                                                                                                                                                                            SUBJECTIVE STATEMENT: Patient reports her back is feeling about the same today. Her knees have been more sore since session prior. She has been compliant with home exercises. She has family coming into town soon and will have a break from scheduled PT.      PERTINENT HISTORY:  Bil THA, R TKA, osteopenia   PAIN:  Are you having pain? Yes: NPRS scale: 1/10 Pain location: Back, occasional radiating L LE Pain description: General aching Aggravating factors: Activity, bending Relieving factors: Rest, tylenol     PRECAUTIONS: None   WEIGHT BEARING RESTRICTIONS No   FALLS:  Has  patient fallen in last 6 months? No   LIVING ENVIRONMENT: Lives with: lives with their spouse Lives in: House/apartment Stairs: Yes: External: 3 steps   OCCUPATION: Retired   PLOF: Independent   PATIENT GOALS   Improve balance and walking, be able to garden and do yard work     OBJECTIVE:    DIAGNOSTIC FINDINGS:   Left knee x-ray: 2022  EXAM: LEFT KNEE - COMPLETE 4+ VIEW   COMPARISON:  None.   FINDINGS: No evidence of fracture, dislocation, or joint effusion. There is tricompartmental moderate joint space narrowing and osteophytes. No focal bone lesion. Regional soft tissues are unremarkable.   IMPRESSION: 1. No acute findings in the left knee. 2. Moderate tricompartmental osteoarthritis   PATIENT SURVEYS:  FOTO 47% baseline 46 percent expected din 1 visit    SCREENING FOR RED FLAGS: Bowel or bladder incontinence: No Spinal tumors: No Cauda equina syndrome: No Compression fracture: No Abdominal aneurysm: No   COGNITION:           Overall cognitive status: Within functional limits for tasks assessed                          SENSATION: WFL   POSTURE: rounded shoulders; stands with forward trunk flexion    PALPATION:     LUMBAR ROM:    Active  AROM  eval  Flexion No significant limitation    Extension 50% limited but no pain    Right lateral rotation Pain 25% limited    Left lateral rotation  No pain    Right rotation    Left rotation     (Blank rows = not tested)   LOWER EXTREMITY ROM:      MMT Right eval Left eval  Hip flexion 20.6 23.6  Hip extension      Hip abduction 41.9 39.5  Hip adduction      Hip internal  rotation      Hip external rotation      Knee flexion      Knee extension 37.1 35.0  Ankle dorsiflexion 33.3 35.4  Ankle plantarflexion      Ankle inversion      Ankle eversion       (Blank rows = not tested)   LOWER EXTREMITY MMT:     Active motion  Right eval Left eval  Hip flexion 90 pain full 90 painful   Hip extension      Hip abduction      Hip adduction      Hip internal rotation      Hip external rotation      Knee flexion      Knee extension      Ankle dorsiflexion      Ankle plantarflexion      Ankle inversion      Ankle eversion       (Blank rows = not tested)   LUMBAR SPECIAL TESTS:  Straight leg raise test: Negative   FUNCTIONAL TESTS:  Berg Balance Scale: deferred to next visit   GAIT: Distance walked: flexed trunk  lateral movement noted       TODAY'S TREATMENT    7/19 Nu-Step: 5 min, L3 for warmup Manual: STM to bil lumbar paraspinals and glute max/med. Grade III CPA/UPA T12-L4 SLR 2x10 Bridging 2x10 LAQ 2x10 3lb Side-stepping x3 laps     7/17 Pt seen for aquatic therapy today.  Treatment took place in water 3.25-4.5 ft in depth at  the [facility] pool. Temp of water was 92.  Pt entered/exited the pool via stairs/step to pattern with 2 hand rails rail. Intro to setting  Walking  Standing holding wall 4 ft: df; pf; add/abd; hip extension Stretching holding to wall 3 ft LB Stretching Lumbar rotation R/L using kick board Kick board push pull. 2x20. Cues for abdominal bracing STS from 3rd step 2x5. Moderate cues for execution, proper body mechanics Steps ups bottom step leading R/L x10 Straddling noodle: cycling; scissor; skiing       Pt requires the buoyancy and hydrostatic pressure of water for support, and to offload joints by unweighting joint load by at least 50 % in naval deep water and by at least 75-80% in chest to neck deep water.  Viscosity of the water is needed for resistance of strengthening. Water current  perturbations provides challenge to standing balance requiring increased core activation.         PATIENT EDUCATION:  Education details: reviewed HEP and symptom mangement  Person educated: Patient Education method: Explanation handout  Education comprehension: verbalized understanding     HOME EXERCISE PROGRAM: Access Code: [email or web address] URL: [email or web address] Date: 07/27/2021 Prepared by: Lorayne Bender   Exercises - Supine Quad Set  - 1 x daily - 7 x weekly - 3 sets - 10 reps - Active Straight Leg Raise with Quad Set  - 1 x daily - 7 x weekly - 3 sets - 10 reps - Supine Heel Slides  - 1 x daily - 7 x weekly - 3 sets - 10 reps   ASSESSMENT:   CLINICAL IMPRESSION: Patient is making fair progress overall. She reports her back pain is the same low-level pain. She tolerates exercises with fair ability; reports no increase in pain. She is able to demonstrate good hip stability with verbal cueing during exercise.     OBJECTIVE IMPAIRMENTS decreased activity tolerance, difficulty walking, decreased ROM, decreased strength, and increased muscle spasms.    ACTIVITY LIMITATIONS lifting, bending, standing, and stairs   PARTICIPATION LIMITATIONS: community activity and yard work   PERSONAL FACTORS Age, Fitness, Past/current experiences, and Time since onset of injury/illness/exacerbation are also affecting patient's functional outcome.    REHAB POTENTIAL: Good   CLINICAL DECISION MAKING: Evolving/moderate complexity declining ability to stand   EVALUATION COMPLEXITY: Moderate     GOALS: Goals reviewed with patient? No   SHORT TERM GOALS: Target date: 08/24/2021   Patient will increase bilateral hip flexion to 105 degrees without pain  Baseline: Goal status: INITIAL   2.  Patient will increase gross bilateral leg strength by 5 lbs  Baseline:  Goal status: INITIAL   3.  Patient will be independent with basic HEP on land and water  Baseline:  Goal status:  INITIAL   LONG TERM GOALS: Target date: 09/21/2021   Patient will go up and down stairs to enter her house without pain  Baseline:  Goal status: INITIAL   2.  Patient will return to walking >1 mile without pain for exercise  Baseline:  Goal status: INITIAL   3.  Patient will have a comprehensive stretching/ land and water strengthening  program  Baseline:  Goal status: INITIAL   PLAN: PT FREQUENCY: 2x/week   PT DURATION: 8 weeks   PLANNED INTERVENTIONS: Therapeutic exercises, Therapeutic activity, Neuromuscular re-education, Balance training, Gait training, Patient/Family education, Self Care, Joint mobilization, Aquatic Therapy, Dry Needling, Electrical stimulation, Cryotherapy, Moist heat, Ionotophoresis 4mg /ml Dexamethasone, and Manual therapy.   PLAN FOR NEXT SESSION: In the  pool work on gait and LE strengthening; the patients ability to stand for periods of time is limited (>5 min ) Assess her standing time in the pool. Work on core and hip strengthening; On land manual therapy to lumbar spine. Pain is higher int he high lumbar ow thoracic area.          Dessie Coma, PT MPT 08/03/2021, 12:52 PM

## 2021-08-28 ENCOUNTER — Encounter (HOSPITAL_BASED_OUTPATIENT_CLINIC_OR_DEPARTMENT_OTHER): Payer: Self-pay | Admitting: Physical Therapy

## 2021-08-28 ENCOUNTER — Ambulatory Visit (HOSPITAL_BASED_OUTPATIENT_CLINIC_OR_DEPARTMENT_OTHER): Payer: Medicare Other | Attending: Sports Medicine | Admitting: Physical Therapy

## 2021-08-28 DIAGNOSIS — M5416 Radiculopathy, lumbar region: Secondary | ICD-10-CM | POA: Insufficient documentation

## 2021-08-28 DIAGNOSIS — M5459 Other low back pain: Secondary | ICD-10-CM | POA: Insufficient documentation

## 2021-08-28 DIAGNOSIS — R2689 Other abnormalities of gait and mobility: Secondary | ICD-10-CM | POA: Diagnosis present

## 2021-08-28 NOTE — Therapy (Signed)
OUTPATIENT PHYSICAL THERAPY THORACOLUMBAR    Patient Name: [name] MRN: [id number] DOB:12-20-1945, 76 y.o., female Today's Date: 08/28/2021   PT End of Session - 08/28/21 1140     Visit Number 4    Number of Visits 16    Date for PT Re-Evaluation 09/21/21    Progress Note Due on Visit 10    PT Start Time 0903    PT Stop Time 0945    PT Time Calculation (min) 42 min    Activity Tolerance Patient tolerated treatment well    Behavior During Therapy [facility] for tasks assessed/performed              Past Medical History:  Diagnosis Date   Arthritis    Psoriasis    Intermittent; all over body and waxes and wanes   Tinnitus of right ear 2013   Intermittent; Melatonin has helped   Vaginal delivery 1973, 1981   Past Surgical History:  Procedure Laterality Date   goiter  1968   was on her Thyroid   IR KYPHO THORACIC WITH BONE BIOPSY  01/20/2019   JOINT REPLACEMENT     Knee replacement Right 2019   TOTAL HIP ARTHROPLASTY     Lt 2002, Rt 2005   VEIN LIGATION AND STRIPPING Right 2005   Patient Active Problem List   Diagnosis Date Noted   Hallux valgus, acquired 07/01/2019   Abnormal EKG 01/20/2019   Tinnitus of right ear    Psoriasis    Arthritis    Osteopenia 01/05/2019      PCP: [name], PA   REFERRING PROVIDER: Melina Fiddler, MD   REFERRING DIAG: pain, radiculopathy   Rationale for Evaluation and Treatment Rehabilitation   THERAPY DIAG:  No diagnosis found.   ONSET DATE: December 2022   SUBJECTIVE:                                                                                                                                                                                            SUBJECTIVE STATEMENT: Pt reports she had family in town; did a lot of cooking - "It was stressful" .  She has being doing her HEP a couple times a week. "Not as shaky as she once was".   She states she has done some gardening     PERTINENT HISTORY:  Bil THA, R  TKA, osteopenia   PAIN:  Are you having pain? Yes: NPRS scale: 1/10 Pain location: Back, occasional radiating L LE Pain description: General aching Aggravating factors: Activity, bending Relieving factors: Rest, tylenol     PRECAUTIONS: None   WEIGHT BEARING RESTRICTIONS No  FALLS:  Has patient fallen in last 6 months? No   LIVING ENVIRONMENT: Lives with: lives with their spouse Lives in: House/apartment Stairs: Yes: External: 3 steps   OCCUPATION: Retired   PLOF: Independent   PATIENT GOALS   Improve balance and walking, be able to garden and do yard work     OBJECTIVE:    DIAGNOSTIC FINDINGS:   Left knee x-ray: 2022  EXAM: LEFT KNEE - COMPLETE 4+ VIEW   COMPARISON:  None.   FINDINGS: No evidence of fracture, dislocation, or joint effusion. There is tricompartmental moderate joint space narrowing and osteophytes. No focal bone lesion. Regional soft tissues are unremarkable.   IMPRESSION: 1. No acute findings in the left knee. 2. Moderate tricompartmental osteoarthritis   PATIENT SURVEYS:  FOTO 47% baseline 46 percent expected din 1 visit    SCREENING FOR RED FLAGS: Bowel or bladder incontinence: No Spinal tumors: No Cauda equina syndrome: No Compression fracture: No Abdominal aneurysm: No   COGNITION:           Overall cognitive status: Within functional limits for tasks assessed                          SENSATION: WFL   POSTURE: rounded shoulders; stands with forward trunk flexion    PALPATION:     LUMBAR ROM:    Active  AROM  eval  Flexion No significant limitation    Extension 50% limited but no pain    Right lateral rotation Pain 25% limited    Left lateral rotation  No pain    Right rotation    Left rotation     (Blank rows = not tested)   LOWER EXTREMITY ROM:      MMT Right eval Left eval  Hip flexion 20.6 23.6  Hip extension      Hip abduction 41.9 39.5  Hip adduction      Hip internal rotation      Hip external  rotation      Knee flexion      Knee extension 37.1 35.0  Ankle dorsiflexion 33.3 35.4  Ankle plantarflexion      Ankle inversion      Ankle eversion       (Blank rows = not tested)   LOWER EXTREMITY MMT:     Active motion  Right eval Left eval  Hip flexion 90 pain full 90 painful   Hip extension      Hip abduction      Hip adduction      Hip internal rotation      Hip external rotation      Knee flexion      Knee extension      Ankle dorsiflexion      Ankle plantarflexion      Ankle inversion      Ankle eversion       (Blank rows = not tested)   LUMBAR SPECIAL TESTS:  Straight leg raise test: Negative   FUNCTIONAL TESTS:  Berg Balance Scale: deferred to next visit   GAIT: Distance walked: flexed trunk  lateral movement noted       TODAY'S TREATMENT   8/14: Pt seen for aquatic therapy today.  Treatment took place in water 3.25-4 ft 2" in depth at the [facility] pool. Temp of water was 92.  Pt entered/exited the pool independently via stairs/step to pattern with 2 hand rails rail    Without support:  forward/  backward walking;  side stepping  SLS x 15 sec without support Holding yellow noodle: tandem gait backward / forward LB flexion stretch holding wall Standing holding wall 4 ft: heel/toe raises; add/abd; hip extension x 10 each High knee marching forward/ backward (painful forward) Quad stretch with foot 2nd step x 2 reps of 15s; L/R hamstring stretch with foot on 2nd step x 15s each Return to walking forward/ backward Holding wall:  squats into relaxed position  x 5 After dried off:  I strip of sensitive skin Rock tape applied to bilat lateral distal quad, curving towards tibial tuberosity; perpendicular piece applied prox lateral knee. Pt instructed in safe tape removal; verbalized understanding.      Pt requires the buoyancy and hydrostatic pressure of water for support, and to offload joints by unweighting joint load by at least 50 % in navel  deep water and by at least 75-80% in chest to neck deep water.  Viscosity of the water is needed for resistance of strengthening. Water current perturbations provides challenge to standing balance requiring increased core activation.   7/19 Nu-Step: 5 min, L3 for warmup Manual: STM to bil lumbar paraspinals and glute max/med. Grade III CPA/UPA T12-L4 SLR 2x10 Bridging 2x10 LAQ 2x10 3lb Side-stepping x3 laps    PATIENT EDUCATION:  Education details: reviewed HEP and symptom mangement  Person educated: Patient Education method: Explanation handout  Education comprehension: verbalized understanding     HOME EXERCISE PROGRAM: Access Code: [email or web address] URL: [email or web address] Date: 07/27/2021 Prepared by: Lorayne Bender   Exercises - Supine Quad Set  - 1 x daily - 7 x weekly - 3 sets - 10 reps - Active Straight Leg Raise with Quad Set  - 1 x daily - 7 x weekly - 3 sets - 10 reps - Supine Heel Slides  - 1 x daily - 7 x weekly - 3 sets - 10 reps   ASSESSMENT:   CLINICAL IMPRESSION: Pt has been away from therapy for ~ 1 month due to family visiting.  She reported tightness and discomfort in knees (L>R) with high knee marching and quad stretch. LE stretches did not provide any relief with knee pain. "Tight and burning" - with squats at wall. Tandem gait tightened LB; reduced with lumbar flexion stretch.  Minor cues to slow speed of gait to focus on more upright trunk.   Trial of Rock tape applied to bilat knees for pain relief and increased proprioception.  Goals are ongoing.     OBJECTIVE IMPAIRMENTS decreased activity tolerance, difficulty walking, decreased ROM, decreased strength, and increased muscle spasms.    ACTIVITY LIMITATIONS lifting, bending, standing, and stairs   PARTICIPATION LIMITATIONS: community activity and yard work   PERSONAL FACTORS Age, Fitness, Past/current experiences, and Time since onset of injury/illness/exacerbation are also affecting patient's  functional outcome.    REHAB POTENTIAL: Good   CLINICAL DECISION MAKING: Evolving/moderate complexity declining ability to stand   EVALUATION COMPLEXITY: Moderate     GOALS: Goals reviewed with patient? No   SHORT TERM GOALS: Target date: 08/24/2021   Patient will increase bilateral hip flexion to 105 degrees without pain  Baseline: Goal status: INITIAL   2.  Patient will increase gross bilateral leg strength by 5 lbs  Baseline:  Goal status: INITIAL   3.  Patient will be independent with basic HEP on land and water  Baseline:  Goal status: INITIAL   LONG TERM GOALS: Target date: 09/21/2021   Patient will go up and down stairs to enter  her house without pain  Baseline:  Goal status: INITIAL   2.  Patient will return to walking >1 mile without pain for exercise  Baseline:  Goal status: INITIAL   3.  Patient will have a comprehensive stretching/ land and water strengthening  program  Baseline:  Goal status: INITIAL   PLAN: PT FREQUENCY: 2x/week   PT DURATION: 8 weeks   PLANNED INTERVENTIONS: Therapeutic exercises, Therapeutic activity, Neuromuscular re-education, Balance training, Gait training, Patient/Family education, Self Care, Joint mobilization, Aquatic Therapy, Dry Needling, Electrical stimulation, Cryotherapy, Moist heat, Ionotophoresis 4mg /ml Dexamethasone, and Manual therapy.   PLAN FOR NEXT SESSION: Work on core and hip strengthening, balance, knee/hip flexibility ; On land manual therapy to lumbar spine.  assess response to tape application.    , PTA 08/28/21 11:46 AM

## 2021-08-30 ENCOUNTER — Encounter (HOSPITAL_BASED_OUTPATIENT_CLINIC_OR_DEPARTMENT_OTHER): Payer: Self-pay | Admitting: Physical Therapy

## 2021-08-30 ENCOUNTER — Ambulatory Visit (HOSPITAL_BASED_OUTPATIENT_CLINIC_OR_DEPARTMENT_OTHER): Payer: Medicare Other | Admitting: Physical Therapy

## 2021-08-30 DIAGNOSIS — M5416 Radiculopathy, lumbar region: Secondary | ICD-10-CM

## 2021-08-30 DIAGNOSIS — R2689 Other abnormalities of gait and mobility: Secondary | ICD-10-CM

## 2021-08-30 DIAGNOSIS — M5459 Other low back pain: Secondary | ICD-10-CM | POA: Diagnosis not present

## 2021-08-30 NOTE — Therapy (Signed)
OUTPATIENT PHYSICAL THERAPY THORACOLUMBAR    Patient Name: [name] MRN: [id number] DOB:1945-07-30, 76 y.o., female Today's Date: 08/30/2021   PT End of Session - 08/30/21 1028     Visit Number 5    Number of Visits 16    Date for PT Re-Evaluation 09/21/21    PT Start Time [id number]    PT Stop Time 1058    PT Time Calculation (min) 43 min    Activity Tolerance Patient tolerated treatment well    Behavior During Therapy WFL for tasks assessed/performed              Past Medical History:  Diagnosis Date   Arthritis    Psoriasis    Intermittent; all over body and waxes and wanes   Tinnitus of right ear 2013   Intermittent; Melatonin has helped   Vaginal delivery 1973, 1981   Past Surgical History:  Procedure Laterality Date   goiter  1968   was on her Thyroid   IR KYPHO THORACIC WITH BONE BIOPSY  01/20/2019   JOINT REPLACEMENT     Knee replacement Right 2019   TOTAL HIP ARTHROPLASTY     Lt 2002, Rt 2005   VEIN LIGATION AND STRIPPING Right 2005   Patient Active Problem List   Diagnosis Date Noted   Hallux valgus, acquired 07/01/2019   Abnormal EKG 01/20/2019   Tinnitus of right ear    Psoriasis    Arthritis    Osteopenia 01/05/2019      PCP: Inda Coke, PA   REFERRING PROVIDER: Verner Chol, MD   REFERRING DIAG: pain, radiculopathy   Rationale for Evaluation and Treatment Rehabilitation   THERAPY DIAG:  No diagnosis found.   ONSET DATE: December 2022   SUBJECTIVE:                                                                                                                                                                                            SUBJECTIVE STATEMENT: The patient reports some increased pain into her gluteal area following her last set of exercise .     PERTINENT HISTORY:  Bil THA, R TKA, osteopenia   PAIN:  Are you having pain? Yes: NPRS scale: 1/10 Pain location: Back, occasional radiating L LE Pain description:  General aching Aggravating factors: Activity, bending Relieving factors: Rest, tylenol     PRECAUTIONS: None   WEIGHT BEARING RESTRICTIONS No   FALLS:  Has patient fallen in last 6 months? No   LIVING ENVIRONMENT: Lives with: lives with their spouse Lives in: House/apartment Stairs: Yes: External: 3 steps   OCCUPATION: Retired   PLOF:  Independent   PATIENT GOALS   Improve balance and walking, be able to garden and do yard work     OBJECTIVE:    DIAGNOSTIC FINDINGS:   Left knee x-ray: 2022  EXAM: LEFT KNEE - COMPLETE 4+ VIEW   COMPARISON:  None.   FINDINGS: No evidence of fracture, dislocation, or joint effusion. There is tricompartmental moderate joint space narrowing and osteophytes. No focal bone lesion. Regional soft tissues are unremarkable.   IMPRESSION: 1. No acute findings in the left knee. 2. Moderate tricompartmental osteoarthritis   PATIENT SURVEYS:  FOTO 47% baseline 46 percent expected din 1 visit    SCREENING FOR RED FLAGS: Bowel or bladder incontinence: No Spinal tumors: No Cauda equina syndrome: No Compression fracture: No Abdominal aneurysm: No   COGNITION:           Overall cognitive status: Within functional limits for tasks assessed                          SENSATION: WFL   POSTURE: rounded shoulders; stands with forward trunk flexion    PALPATION:     LUMBAR ROM:    Active  AROM  eval  Flexion No significant limitation    Extension 50% limited but no pain    Right lateral rotation Pain 25% limited    Left lateral rotation  No pain    Right rotation    Left rotation     (Blank rows = not tested)   LOWER EXTREMITY ROM:      MMT Right eval Left eval  Hip flexion 20.6 23.6  Hip extension      Hip abduction 41.9 39.5  Hip adduction      Hip internal rotation      Hip external rotation      Knee flexion      Knee extension 37.1 35.0  Ankle dorsiflexion 33.3 35.4  Ankle plantarflexion      Ankle inversion       Ankle eversion       (Blank rows = not tested)   LOWER EXTREMITY MMT:     Active motion  Right eval Left eval  Hip flexion 90 pain full 90 painful   Hip extension      Hip abduction      Hip adduction      Hip internal rotation      Hip external rotation      Knee flexion      Knee extension      Ankle dorsiflexion      Ankle plantarflexion      Ankle inversion      Ankle eversion       (Blank rows = not tested)   LUMBAR SPECIAL TESTS:  Straight leg raise test: Negative   FUNCTIONAL TESTS:  Berg Balance Scale: deferred to next visit   GAIT: Distance walked: flexed trunk  lateral movement noted       TODAY'S TREATMENT   8/16 Nu-Step: 5 min, L3 for warmup Manual: STM to bil lumbar paraspinals and glute max/med. Grade III CPA T12-L4 Gluteal stretch 3x20 sec hold    SLR 2x10 Bridging 2x10 Hip abductions red 2x15   Shoulder row red with cuing for breathing 2x15 red  Shoulder extension 2x15 red      8/14: Pt seen for aquatic therapy today.  Treatment took place in water 3.25-4 ft 2" in depth at the [facility]. Temp of water was 92.  Pt entered/exited the pool independently via stairs/step to pattern with 2 hand rails rail    Without support:  forward/ backward walking;  side stepping  SLS x 15 sec without support Holding yellow noodle: tandem gait backward / forward LB flexion stretch holding wall Standing holding wall 4 ft: heel/toe raises; add/abd; hip extension x 10 each High knee marching forward/ backward (painful forward) Quad stretch with foot 2nd step x 2 reps of 15s; L/R hamstring stretch with foot on 2nd step x 15s each Return to walking forward/ backward Holding wall:  squats into relaxed position  x 5 After dried off:  I strip of sensitive skin Rock tape applied to bilat lateral distal quad, curving towards tibial tuberosity; perpendicular piece applied prox lateral knee. Pt instructed in safe tape removal; verbalized  understanding.      Pt requires the buoyancy and hydrostatic pressure of water for support, and to offload joints by unweighting joint load by at least 50 % in navel deep water and by at least 75-80% in chest to neck deep water.  Viscosity of the water is needed for resistance of strengthening. Water current perturbations provides challenge to standing balance requiring increased core activation.   7/19 Nu-Step: 5 min, L3 for warmup Manual: STM to bil lumbar paraspinals and glute max/med. Grade III CPA/UPA T12-L4 SLR 2x10 Bridging 2x10 LAQ 2x10 3lb Side-stepping x3 laps    PATIENT EDUCATION:  Education details: reviewed HEP and symptom mangement  Person educated: Patient Education method: Explanation handout  Education comprehension: verbalized understanding     HOME EXERCISE PROGRAM: Access Code: [email or web address] URL: [email or web address] Date: 07/27/2021 Prepared by: Lorayne Bender   Exercises - Supine Quad Set  - 1 x daily - 7 x weekly - 3 sets - 10 reps - Active Straight Leg Raise with Quad Set  - 1 x daily - 7 x weekly - 3 sets - 10 reps - Supine Heel Slides  - 1 x daily - 7 x weekly - 3 sets - 10 reps   ASSESSMENT:   CLINICAL IMPRESSION: Patient tolerated treatment well. She had trigger points in her gluteal area, which therapy worked on. She had no significant pain with her exercises. She did report mild lateral knee pain with bridging. She was given upper extremity core strengthening to work on at home. Therapy reviewed her HEP. She had some things that were difficult to do at home. We gave her a program that was ore realistic. Therapy will continue to advance as tolerated.    OBJECTIVE IMPAIRMENTS decreased activity tolerance, difficulty walking, decreased ROM, decreased strength, and increased muscle spasms.    ACTIVITY LIMITATIONS lifting, bending, standing, and stairs   PARTICIPATION LIMITATIONS: community activity and yard work   PERSONAL FACTORS Age,  Fitness, Past/current experiences, and Time since onset of injury/illness/exacerbation are also affecting patient's functional outcome.    REHAB POTENTIAL: Good   CLINICAL DECISION MAKING: Evolving/moderate complexity declining ability to stand   EVALUATION COMPLEXITY: Moderate     GOALS: Goals reviewed with patient? No   SHORT TERM GOALS: Target date: 08/24/2021   Patient will increase bilateral hip flexion to 105 degrees without pain  Baseline: Goal status: INITIAL   2.  Patient will increase gross bilateral leg strength by 5 lbs  Baseline:  Goal status: INITIAL   3.  Patient will be independent with basic HEP on land and water  Baseline:  Goal status: INITIAL   LONG TERM GOALS: Target date: 09/21/2021   Patient will go up  and down stairs to enter her house without pain  Baseline:  Goal status: INITIAL   2.  Patient will return to walking >1 mile without pain for exercise  Baseline:  Goal status: INITIAL   3.  Patient will have a comprehensive stretching/ land and water strengthening  program  Baseline:  Goal status: INITIAL   PLAN: PT FREQUENCY: 2x/week   PT DURATION: 8 weeks   PLANNED INTERVENTIONS: Therapeutic exercises, Therapeutic activity, Neuromuscular re-education, Balance training, Gait training, Patient/Family education, Self Care, Joint mobilization, Aquatic Therapy, Dry Needling, Electrical stimulation, Cryotherapy, Moist heat, Ionotophoresis 4mg /ml Dexamethasone, and Manual therapy.   PLAN FOR NEXT SESSION: Work on core and hip strengthening, balance, knee/hip flexibility ; On land manual therapy to lumbar spine.  assess response to tape application.    PT DPT  08/30/21 4:49 PM

## 2021-09-04 ENCOUNTER — Ambulatory Visit (HOSPITAL_BASED_OUTPATIENT_CLINIC_OR_DEPARTMENT_OTHER): Payer: Medicare Other | Admitting: Physical Therapy

## 2021-09-06 ENCOUNTER — Ambulatory Visit (HOSPITAL_BASED_OUTPATIENT_CLINIC_OR_DEPARTMENT_OTHER): Payer: Medicare Other | Admitting: Physical Therapy

## 2021-09-12 ENCOUNTER — Encounter (HOSPITAL_BASED_OUTPATIENT_CLINIC_OR_DEPARTMENT_OTHER): Payer: Self-pay | Admitting: Physical Therapy

## 2021-09-12 ENCOUNTER — Ambulatory Visit (HOSPITAL_BASED_OUTPATIENT_CLINIC_OR_DEPARTMENT_OTHER): Payer: Medicare Other | Admitting: Physical Therapy

## 2021-09-12 DIAGNOSIS — M5459 Other low back pain: Secondary | ICD-10-CM

## 2021-09-12 DIAGNOSIS — R2689 Other abnormalities of gait and mobility: Secondary | ICD-10-CM

## 2021-09-12 DIAGNOSIS — M5416 Radiculopathy, lumbar region: Secondary | ICD-10-CM

## 2021-09-12 NOTE — Therapy (Addendum)
OUTPATIENT PHYSICAL THERAPY THORACOLUMBAR    Patient Name: [name] MRN: [id number] DOB:Jun 11, 1945, 76 y.o., female Today's Date: 09/12/2021   PT End of Session - 09/12/21 1052     Visit Number 6    Number of Visits 16    Date for PT Re-Evaluation 09/21/21    Progress Note Due on Visit 10    PT Start Time 1033    PT Stop Time 1115    PT Time Calculation (min) 42 min    Activity Tolerance Patient tolerated treatment well    Behavior During Therapy WFL for tasks assessed/performed               Past Medical History:  Diagnosis Date   Arthritis    Psoriasis    Intermittent; all over body and waxes and wanes   Tinnitus of right ear 2013   Intermittent; Melatonin has helped   Vaginal delivery 1973, 1981   Past Surgical History:  Procedure Laterality Date   goiter  1968   was on her Thyroid   IR KYPHO THORACIC WITH BONE BIOPSY  01/20/2019   JOINT REPLACEMENT     Knee replacement Right 2019   TOTAL HIP ARTHROPLASTY     Lt 2002, Rt 2005   VEIN LIGATION AND STRIPPING Right 2005   Patient Active Problem List   Diagnosis Date Noted   Hallux valgus, acquired 07/01/2019   Abnormal EKG 01/20/2019   Tinnitus of right ear    Psoriasis    Arthritis    Osteopenia 01/05/2019      PCP: [name], PA   REFERRING PROVIDER: Melina Fiddler, MD   REFERRING DIAG: pain, radiculopathy   Rationale for Evaluation and Treatment Rehabilitation   THERAPY DIAG:  No diagnosis found.   ONSET DATE: December 2022   SUBJECTIVE:                                                                                                                                                                                            SUBJECTIVE STATEMENT: Pt reports she was sick last week.  Her gluteal pain has "cleared up since laying around".   She is concerned about an area on her R lower leg that was previously a wound; "it's sore".  She reports that she left the tape on knees for 4 day,  and it helped.     PERTINENT HISTORY:  Bil THA, R TKA, osteopenia   PAIN:  Are you having pain? no: NPRS scale: 0/10 Pain location:  Pain description:  Aggravating factors:  Relieving factors:      PRECAUTIONS: None   WEIGHT BEARING  RESTRICTIONS No   FALLS:  Has patient fallen in last 6 months? No   LIVING ENVIRONMENT: Lives with: lives with their spouse Lives in: House/apartment Stairs: Yes: External: 3 steps   OCCUPATION: Retired   PLOF: Independent   PATIENT GOALS   Improve balance and walking, be able to garden and do yard work     OBJECTIVE:    DIAGNOSTIC FINDINGS:   Left knee x-ray: 2022  EXAM: LEFT KNEE - COMPLETE 4+ VIEW   COMPARISON:  None.   FINDINGS: No evidence of fracture, dislocation, or joint effusion. There is tricompartmental moderate joint space narrowing and osteophytes. No focal bone lesion. Regional soft tissues are unremarkable.   IMPRESSION: 1. No acute findings in the left knee. 2. Moderate tricompartmental osteoarthritis   PATIENT SURVEYS:  FOTO 47% baseline 46 percent expected din 1 visit    SCREENING FOR RED FLAGS: Bowel or bladder incontinence: No Spinal tumors: No Cauda equina syndrome: No Compression fracture: No Abdominal aneurysm: No   COGNITION:           Overall cognitive status: Within functional limits for tasks assessed                          SENSATION: WFL   POSTURE: rounded shoulders; stands with forward trunk flexion    PALPATION:     LUMBAR ROM:    Active  AROM  eval  Flexion No significant limitation    Extension 50% limited but no pain    Right lateral rotation Pain 25% limited    Left lateral rotation  No pain    Right rotation    Left rotation     (Blank rows = not tested)   LOWER EXTREMITY ROM:      MMT Right eval Left eval  Hip flexion 20.6 23.6  Hip extension      Hip abduction 41.9 39.5  Hip adduction      Hip internal rotation      Hip external rotation      Knee flexion       Knee extension 37.1 35.0  Ankle dorsiflexion 33.3 35.4  Ankle plantarflexion      Ankle inversion      Ankle eversion       (Blank rows = not tested)   LOWER EXTREMITY MMT:     Active motion  Right eval Left eval  Hip flexion 90 pain full 90 painful   Hip extension      Hip abduction      Hip adduction      Hip internal rotation      Hip external rotation      Knee flexion      Knee extension      Ankle dorsiflexion      Ankle plantarflexion      Ankle inversion      Ankle eversion       (Blank rows = not tested)   LUMBAR SPECIAL TESTS:  Straight leg raise test: Negative   FUNCTIONAL TESTS:  Berg Balance Scale: deferred to next visit   GAIT: Distance walked: flexed trunk  lateral movement noted       TODAY'S TREATMENT  8/29 Pt seen for aquatic therapy today.  Treatment took place in water 3.25-4.63ft depth at the [facility] pool. Temp of water was 92.  Pt entered/exited the pool independently via stairs/step to pattern with 2 hand rails rail    Without support:  forward/  backward walking;  side stepping  High knee marching forward/ backward  Holding yellow hand buoys: tandem gait backward / forward x 3 laps Standing holding wall 4 ft: heel/toe raises; add/abd; hip extension x 10 each Squats (no support) Bilat shoulder horiz abdct/add x 10; abdct/add x 10 holding rainbow hand buoys Staggered stance with kick board push/pull (kick board 1/2 submerged, core engaged) Quad stretch with foot 2nd step  15s; L/R hamstring stretch with foot on 2nd step 15s each   Pt requires the buoyancy and hydrostatic pressure of water for support, and to offload joints by unweighting joint load by at least 50 % in navel deep water and by at least 75-80% in chest to neck deep water.  Viscosity of the water is needed for resistance of strengthening. Water current perturbations provides challenge to standing balance requiring increased core activation.   8/16 Nu-Step: 5 min,  L3 for warmup Manual: STM to bil lumbar paraspinals and glute max/med. Grade III CPA T12-L4 Gluteal stretch 3x20 sec hold  SLR 2x10 Bridging 2x10 Hip abductions red 2x15  Shoulder row red with cuing for breathing 2x15 red  Shoulder extension 2x15 red        PATIENT EDUCATION:  Education details: aquatic progressions  Person educated: Patient Education method: Explanation handout  Education comprehension: verbalized understanding     HOME EXERCISE PROGRAM: Access Code: [email or web address] URL: [email or web address] Date: 07/27/2021 Prepared by: Lorayne Bender   Exercises - Supine Quad Set  - 1 x daily - 7 x weekly - 3 sets - 10 reps - Active Straight Leg Raise with Quad Set  - 1 x daily - 7 x weekly - 3 sets - 10 reps - Supine Heel Slides  - 1 x daily - 7 x weekly - 3 sets - 10 reps   ASSESSMENT:   CLINICAL IMPRESSION: Some knee discomfort reported with increased knee flexion of high knee marching, but it was tolerated better than last aquatic visit. No other complaint of pain during session.  Pt will be away on vacation next week; upon return will need recert at next land visit.  Therapy will continue to advance as tolerated. (Discussed having lower leg checked out by wound center that treated her before; will follow up next visit)   OBJECTIVE IMPAIRMENTS decreased activity tolerance, difficulty walking, decreased ROM, decreased strength, and increased muscle spasms.    ACTIVITY LIMITATIONS lifting, bending, standing, and stairs   PARTICIPATION LIMITATIONS: community activity and yard work   PERSONAL FACTORS Age, Fitness, Past/current experiences, and Time since onset of injury/illness/exacerbation are also affecting patient's functional outcome.    REHAB POTENTIAL: Good   CLINICAL DECISION MAKING: Evolving/moderate complexity declining ability to stand   EVALUATION COMPLEXITY: Moderate     GOALS: Goals reviewed with patient? No   SHORT TERM GOALS: Target date:  08/24/2021   Patient will increase bilateral hip flexion to 105 degrees without pain  Baseline: Goal status: INITIAL   2.  Patient will increase gross bilateral leg strength by 5 lbs  Baseline:  Goal status: INITIAL   3.  Patient will be independent with basic HEP on land and water  Baseline:  Goal status: INITIAL   LONG TERM GOALS: Target date: 09/21/2021   Patient will go up and down stairs to enter her house without pain  Baseline:  Goal status: INITIAL   2.  Patient will return to walking >1 mile without pain for exercise  Baseline:  Goal status: INITIAL   3.  Patient will have  a comprehensive stretching/ land and water strengthening  program  Baseline:  Goal status: INITIAL   PLAN: PT FREQUENCY: 2x/week   PT DURATION: 8 weeks   PLANNED INTERVENTIONS: Therapeutic exercises, Therapeutic activity, Neuromuscular re-education, Balance training, Gait training, Patient/Family education, Self Care, Joint mobilization, Aquatic Therapy, Dry Needling, Electrical stimulation, Cryotherapy, Moist heat, Ionotophoresis 4mg /ml Dexamethasone, and Manual therapy.   PLAN FOR NEXT SESSION: assess goals for recert.    , PTA 09/12/21 11:18 AM

## 2021-09-27 ENCOUNTER — Ambulatory Visit (HOSPITAL_BASED_OUTPATIENT_CLINIC_OR_DEPARTMENT_OTHER): Payer: Medicare Other | Attending: Sports Medicine | Admitting: Physical Therapy

## 2021-09-27 ENCOUNTER — Encounter (HOSPITAL_BASED_OUTPATIENT_CLINIC_OR_DEPARTMENT_OTHER): Payer: Self-pay | Admitting: Physical Therapy

## 2021-09-27 DIAGNOSIS — M5416 Radiculopathy, lumbar region: Secondary | ICD-10-CM | POA: Diagnosis present

## 2021-09-27 DIAGNOSIS — R2689 Other abnormalities of gait and mobility: Secondary | ICD-10-CM | POA: Diagnosis present

## 2021-09-27 DIAGNOSIS — M5459 Other low back pain: Secondary | ICD-10-CM | POA: Diagnosis present

## 2021-09-27 NOTE — Therapy (Signed)
OUTPATIENT PHYSICAL THERAPY THORACOLUMBAR/ progress note    Progress Note Reporting Period 07/27/2021  to 09/27/2021  See note below for Objective Data and Assessment of Progress/Goals.      Patient Name: [name] MRN: [id number] DOB:15-Apr-1945, 76 y.o., female Today's Date: 09/27/2021   PT End of Session - 09/27/21 1021     Visit Number 7    Number of Visits 16    Date for PT Re-Evaluation 09/21/21    PT Start Time 9977    PT Stop Time 1056    PT Time Calculation (min) 41 min    Activity Tolerance Patient tolerated treatment well    Behavior During Therapy WFL for tasks assessed/performed               Past Medical History:  Diagnosis Date   Arthritis    Psoriasis    Intermittent; all over body and waxes and wanes   Tinnitus of right ear 2013   Intermittent; Melatonin has helped   Vaginal delivery 1973, 1981   Past Surgical History:  Procedure Laterality Date   goiter  1968   was on her Thyroid   IR KYPHO THORACIC WITH BONE BIOPSY  01/20/2019   JOINT REPLACEMENT     Knee replacement Right 2019   TOTAL HIP ARTHROPLASTY     Lt 2002, Rt 2005   VEIN LIGATION AND STRIPPING Right 2005   Patient Active Problem List   Diagnosis Date Noted   Hallux valgus, acquired 07/01/2019   Abnormal EKG 01/20/2019   Tinnitus of right ear    Psoriasis    Arthritis    Osteopenia 01/05/2019      PCP: Inda Coke, PA   REFERRING PROVIDER: Verner Chol, MD   REFERRING DIAG: pain, radiculopathy   Rationale for Evaluation and Treatment Rehabilitation   THERAPY DIAG:  No diagnosis found.   ONSET DATE: December 2022   SUBJECTIVE:                                                                                                                                                                                            SUBJECTIVE STATEMENT: The patient reports her lower back has been doing pretty good.  She is worried that when she goes back to doing more  activity that it may get irritated again. She traveled to [place] and packed the car and did not have much pain.   PERTINENT HISTORY:  Bil THA, R TKA, osteopenia   PAIN:  Are you having pain? no: NPRS scale: 0/10 Pain location:  Pain description:  Aggravating factors:  Relieving factors:  PRECAUTIONS: None   WEIGHT BEARING RESTRICTIONS No   FALLS:  Has patient fallen in last 6 months? No   LIVING ENVIRONMENT: Lives with: lives with their spouse Lives in: House/apartment Stairs: Yes: External: 3 steps   OCCUPATION: Retired   PLOF: Independent   PATIENT GOALS   Improve balance and walking, be able to garden and do yard work     OBJECTIVE:    DIAGNOSTIC FINDINGS:   Left knee x-ray: 2022  EXAM: LEFT KNEE - COMPLETE 4+ VIEW   COMPARISON:  None.   FINDINGS: No evidence of fracture, dislocation, or joint effusion. There is tricompartmental moderate joint space narrowing and osteophytes. No focal bone lesion. Regional soft tissues are unremarkable.   IMPRESSION: 1. No acute findings in the left knee. 2. Moderate tricompartmental osteoarthritis   PATIENT SURVEYS:  FOTO 47% baseline 46 percent expected din 1 visit    SCREENING FOR RED FLAGS: Bowel or bladder incontinence: No Spinal tumors: No Cauda equina syndrome: No Compression fracture: No Abdominal aneurysm: No   COGNITION:           Overall cognitive status: Within functional limits for tasks assessed                          SENSATION: WFL   POSTURE: rounded shoulders; stands with forward trunk flexion    PALPATION:     LUMBAR ROM:    Active  AROM  eval AROM  9/1/3  Flexion No significant limitation     Extension 50% limited but no pain   25%   Right lateral rotation Pain 25% limited   No significant pain   Left lateral rotation  No pain     Right rotation     Left rotation      (Blank rows = not tested)   LOWER EXTREMITY ROM:      MMT Right eval Left eval Right  9/13  Left 9/13  Hip flexion 20.6 23.6 22.7 14  Hip extension        Hip abduction 41.9 39.5 37.5 29  Hip adduction        Hip internal rotation        Hip external rotation        Knee flexion        Knee extension 37.1 35.0 41.4 28.4  Ankle dorsiflexion 33.3 35.4    Ankle plantarflexion        Ankle inversion        Ankle eversion         (Blank rows = not tested)   LOWER EXTREMITY MMT:     Active motion  Right eval Left eval Right  Left   Hip flexion 90 pain full 90 painful  110 with tailbone pain  110 with knee pain   Hip extension        Hip abduction        Hip adduction        Hip internal rotation        Hip external rotation        Knee flexion        Knee extension        Ankle dorsiflexion        Ankle plantarflexion        Ankle inversion        Ankle eversion         (Blank rows = not tested)   LUMBAR SPECIAL TESTS:  Straight leg raise test: Negative   FUNCTIONAL TESTS:  Berg Balance Scale: deferred to next visit   GAIT: Distance walked: flexed trunk  lateral movement noted       TODAY'S TREATMENT  9/13 Gluteal stretch 3x20 sec hold  SLR 2x10 Bridging 2x10 Hip abductions red 2x15  Shoulder row red with cuing for breathing 2x15 red  Shoulder extension 2x15 red  Performed re-assessment on the patient. Reviewed test and measures and goals including strength testing, FOTO, and ROM   8/29 Pt seen for aquatic therapy today.  Treatment took place in [facility] 3.25-4.69f [facility] Temp of water was 92.  Pt entered/exited the pool independently via stairs/step to pattern with 2 hand rails rail    Without support:  forward/ backward walking;  side stepping  High knee marching forward/ backward  Holding yellow hand buoys: tandem gait backward / forward x 3 laps Standing holding wall 4 ft: heel/toe raises; add/abd; hip extension x 10 each Squats (no support) Bilat shoulder horiz abdct/add x 10; abdct/add x 10 holding rainbow hand  buoys Staggered stance with kick board push/pull (kick board 1/2 submerged, core engaged) Quad stretch with foot 2nd step  15s; L/R hamstring stretch with foot on 2nd step 15s each   Pt requires the buoyancy and hydrostatic pressure of water for support, and to offload joints by unweighting joint load by at least 50 % in navel deep water and by at least 75-80% in chest to neck deep water.  Viscosity of the water is needed for resistance of strengthening. Water current perturbations provides challenge to standing balance requiring increased core activation.   8/16 Nu-Step: 5 min, L3 for warmup Manual: STM to bil lumbar paraspinals and glute max/med. Grade III CPA T12-L4 Gluteal stretch 3x20 sec hold  SLR 2x10 Bridging 2x10 Hip abductions red 2x15  Shoulder row red with cuing for breathing 2x15 red  Shoulder extension 2x15 red        PATIENT EDUCATION:  Education details: aquatic progressions  Person educated: Patient Education method: Explanation handout  Education comprehension: verbalized understanding     HOME EXERCISE PROGRAM: Access Code: [email or web address]: [email or web address] Date: 07/27/2021 Prepared by: DCarolyne Littles  Exercises - Supine Quad Set  - 1 x daily - 7 x weekly - 3 sets - 10 reps - Active Straight Leg Raise with Quad Set  - 1 x daily - 7 x weekly - 3 sets - 10 reps - Supine Heel Slides  - 1 x daily - 7 x weekly - 3 sets - 10 reps   ASSESSMENT:   CLINICAL IMPRESSION: The patient overall is making progress. She has met her FOTO goal at this time. Her strength on her right has improved, but the strength on the left has decreased a bit. Overall she feels like her back is moving in the right direction. She feels the pool has helped. Her hip flexion without pain has improved. She continues to have some knee pain. We will continue 2W6 to work on functional strengthening and core strengthening exercises. Today we reviewed some knee specific exercises to  work on.   OBJECTIVE IMPAIRMENTS decreased activity tolerance, difficulty walking, decreased ROM, decreased strength, and increased muscle spasms.    ACTIVITY LIMITATIONS lifting, bending, standing, and stairs   PARTICIPATION LIMITATIONS: community activity and yard work   PERSONAL FACTORS Age, Fitness, Past/current experiences, and Time since onset of injury/illness/exacerbation are also affecting patient's functional outcome.    REHAB POTENTIAL: Good  CLINICAL DECISION MAKING: Evolving/moderate complexity declining ability to stand   EVALUATION COMPLEXITY: Moderate     GOALS: Goals reviewed with patient? No   SHORT TERM GOALS: Target date: 10/25/2021   Patient will increase bilateral hip flexion to 105 degrees without pain  Baseline: Goal status: Achieved 110 bilateral    2.  Patient will increase gross bilateral leg strength by 5 lbs  Baseline:  Goal status: right stronger left weaker. Loraine Leriche continue to monitor    3.  Patient will be independent with basic HEP on land and water  Baseline:  Goal status: has basic HEP    LONG TERM GOALS: Target date: 09/21/2021   Patient will go up and down stairs to enter her house without pain  Baseline:  Goal status: will continue to work on this in the pool Ongoing   2.  Patient will return to walking >1 mile without pain  Baseline:  Goal status:  has mild pain    3.  Patient will have a comprehensive stretching/ land and water strengthening  program  Baseline:  Goal status: Developing/ Ongoing    PLAN: PT FREQUENCY: 2x/week   PT DURATION: 8 weeks   PLANNED INTERVENTIONS: Therapeutic exercises, Therapeutic activity, Neuromuscular re-education, Balance training, Gait training, Patient/Family education, Self Care, Joint mobilization, Aquatic Therapy, Dry Needling, Electrical stimulation, Cryotherapy, Moist heat, Ionotophoresis 23m/ml Dexamethasone, and Manual therapy.   PLAN FOR NEXT SESSION: assess goals for recert.   DCarolyne LittlesPT DPT  09/27/21 10:26 AM

## 2021-09-29 ENCOUNTER — Ambulatory Visit (HOSPITAL_BASED_OUTPATIENT_CLINIC_OR_DEPARTMENT_OTHER): Payer: Medicare Other | Admitting: Physical Therapy

## 2021-09-29 ENCOUNTER — Encounter (HOSPITAL_BASED_OUTPATIENT_CLINIC_OR_DEPARTMENT_OTHER): Payer: Self-pay | Admitting: Physical Therapy

## 2021-09-29 DIAGNOSIS — M5416 Radiculopathy, lumbar region: Secondary | ICD-10-CM

## 2021-09-29 DIAGNOSIS — M5459 Other low back pain: Secondary | ICD-10-CM

## 2021-09-29 DIAGNOSIS — R2689 Other abnormalities of gait and mobility: Secondary | ICD-10-CM

## 2021-09-29 NOTE — Therapy (Signed)
OUTPATIENT PHYSICAL THERAPY THORACOLUMBAR/ progress note         Patient Name: [name] MRN: [id number] DOB:January 23, 1945, 76 y.o., female Today's Date: 09/29/2021   PT End of Session - 09/29/21 1043     Visit Number 8    Number of Visits 16    Date for PT Re-Evaluation 11/08/21    Authorization Type progress note done on visit 7    PT Start Time 1035    PT Stop Time 1115    PT Time Calculation (min) 40 min    Activity Tolerance Patient tolerated treatment well    Behavior During Therapy WFL for tasks assessed/performed                Past Medical History:  Diagnosis Date   Arthritis    Psoriasis    Intermittent; all over body and waxes and wanes   Tinnitus of right ear 2013   Intermittent; Melatonin has helped   Vaginal delivery 1973, 1981   Past Surgical History:  Procedure Laterality Date   goiter  1968   was on her Thyroid   IR KYPHO THORACIC WITH BONE BIOPSY  01/20/2019   JOINT REPLACEMENT     Knee replacement Right 2019   TOTAL HIP ARTHROPLASTY     Lt 2002, Rt 2005   VEIN LIGATION AND STRIPPING Right 2005   Patient Active Problem List   Diagnosis Date Noted   Hallux valgus, acquired 07/01/2019   Abnormal EKG 01/20/2019   Tinnitus of right ear    Psoriasis    Arthritis    Osteopenia 01/05/2019      PCP: Inda Coke, PA   REFERRING PROVIDER: Verner Chol, MD   REFERRING DIAG: pain, radiculopathy   Rationale for Evaluation and Treatment Rehabilitation   THERAPY DIAG:  No diagnosis found.   ONSET DATE: December 2022   SUBJECTIVE:                                                                                                                                                                                            SUBJECTIVE STATEMENT: The patient reports increase in LBP since last session. Not sure if that's what did it or her activity over past week.  PERTINENT HISTORY:  Bil THA, R TKA, osteopenia   PAIN:  Are you having  pain? no: NPRS scale: 5/10 Pain location:  Pain description:  Aggravating factors:  Relieving factors:      PRECAUTIONS: None   WEIGHT BEARING RESTRICTIONS No   FALLS:  Has patient fallen in last 6 months? No   LIVING ENVIRONMENT: Lives with: lives with their spouse  Lives in: House/apartment Stairs: Yes: External: 3 steps   OCCUPATION: Retired   PLOF: Independent   PATIENT GOALS   Improve balance and walking, be able to garden and do yard work     OBJECTIVE:    DIAGNOSTIC FINDINGS:   Left knee x-ray: 2022  EXAM: LEFT KNEE - COMPLETE 4+ VIEW   COMPARISON:  None.   FINDINGS: No evidence of fracture, dislocation, or joint effusion. There is tricompartmental moderate joint space narrowing and osteophytes. No focal bone lesion. Regional soft tissues are unremarkable.   IMPRESSION: 1. No acute findings in the left knee. 2. Moderate tricompartmental osteoarthritis   PATIENT SURVEYS:  FOTO 47% baseline 46 percent expected din 1 visit    SCREENING FOR RED FLAGS: Bowel or bladder incontinence: No Spinal tumors: No Cauda equina syndrome: No Compression fracture: No Abdominal aneurysm: No   COGNITION:           Overall cognitive status: Within functional limits for tasks assessed                          SENSATION: WFL   POSTURE: rounded shoulders; stands with forward trunk flexion    PALPATION:     LUMBAR ROM:    Active  AROM  eval AROM  9/1/3  Flexion No significant limitation     Extension 50% limited but no pain   25%   Right lateral rotation Pain 25% limited   No significant pain   Left lateral rotation  No pain     Right rotation     Left rotation      (Blank rows = not tested)   LOWER EXTREMITY ROM:      MMT Right eval Left eval Right  9/13 Left 9/13  Hip flexion 20.6 23.6 22.7 14  Hip extension        Hip abduction 41.9 39.5 37.5 29  Hip adduction        Hip internal rotation        Hip external rotation        Knee flexion         Knee extension 37.1 35.0 41.4 28.4  Ankle dorsiflexion 33.3 35.4    Ankle plantarflexion        Ankle inversion        Ankle eversion         (Blank rows = not tested)   LOWER EXTREMITY MMT:     Active motion  Right eval Left eval Right  Left   Hip flexion 90 pain full 90 painful  110 with tailbone pain  110 with knee pain   Hip extension        Hip abduction        Hip adduction        Hip internal rotation        Hip external rotation        Knee flexion        Knee extension        Ankle dorsiflexion        Ankle plantarflexion        Ankle inversion        Ankle eversion         (Blank rows = not tested)   LUMBAR SPECIAL TESTS:  Straight leg raise test: Negative   FUNCTIONAL TESTS:  Berg Balance Scale: deferred to next visit   GAIT: Distance walked: flexed trunk  lateral movement noted  TODAY'S TREATMENT  10/15  Pt seen for aquatic therapy today.  Treatment took place in water 3.25-4.76ft depth at the [facility] pool. Temp of water was 92.  Pt entered/exited the pool independently via stairs/step to pattern with 2 hand rails rail    Without support:  forward/ backward walking;  side stepping  High knee marching forward/ backward  Holding yellow hand buoys: tandem gait backward / forward x 3 laps ea Staggered stance with kick board push/pull (kick board 1/2 submerged, core engaged) Hip hinge 2x 5 with pause for LB stretch Lumbar rotation 2 x10  Standing holding wall 4 ft: heel/toe raises; add/abd; hip extension x 10 each   Squats (no support) Bilat shoulder horiz abdct/add x 10; abdct/add x 10 using rainbow hand buoys   L/R hamstring stretch with foot on 2nd step 15s each End session indep cycling on yellow noodle   Pt requires the buoyancy and hydrostatic pressure of water for support, and to offload joints by unweighting joint load by at least 50 % in navel deep water and by at least 75-80% in chest to neck deep water.  Viscosity of the  water is needed for resistance of strengthening. Water current perturbations provides challenge to standing balance requiring increased core activation.  9/13 Gluteal stretch 3x20 sec hold  SLR 2x10 Bridging 2x10 Hip abductions red 2x15  Shoulder row red with cuing for breathing 2x15 red  Shoulder extension 2x15 red  Performed re-assessment on the patient. Reviewed test and measures and goals including strength testing, FOTO, and ROM   8/29 Pt seen for aquatic therapy today.  Treatment took place in water 3.25-4.23ft depth at the [facility] pool. Temp of water was 92.  Pt entered/exited the pool independently via stairs/step to pattern with 2 hand rails rail    Without support:  forward/ backward walking;  side stepping  High knee marching forward/ backward  Holding yellow hand buoys: tandem gait backward / forward x 3 laps Standing holding wall 4 ft: heel/toe raises; add/abd; hip extension x 10 each Squats (no support) Bilat shoulder horiz abdct/add x 10; abdct/add x 10 holding rainbow hand buoys Staggered stance with kick board push/pull (kick board 1/2 submerged, core engaged) Quad stretch with foot 2nd step  15s; L/R hamstring stretch with foot on 2nd step 15s each   Pt requires the buoyancy and hydrostatic pressure of water for support, and to offload joints by unweighting joint load by at least 50 % in navel deep water and by at least 75-80% in chest to neck deep water.  Viscosity of the water is needed for resistance of strengthening. Water current perturbations provides challenge to standing balance requiring increased core activation.   8/16 Nu-Step: 5 min, L3 for warmup Manual: STM to bil lumbar paraspinals and glute max/med. Grade III CPA T12-L4 Gluteal stretch 3x20 sec hold  SLR 2x10 Bridging 2x10 Hip abductions red 2x15  Shoulder row red with cuing for breathing 2x15 red  Shoulder extension 2x15 red        PATIENT EDUCATION:  Education details: aquatic  progressions  Person educated: Patient Education method: Explanation handout  Education comprehension: verbalized understanding     HOME EXERCISE PROGRAM: Access Code: [email or web address] URL: [email or web address] Date: 07/27/2021 Prepared by: Lorayne Bender   Exercises - Supine Quad Set  - 1 x daily - 7 x weekly - 3 sets - 10 reps - Active Straight Leg Raise with Quad Set  - 1 x daily - 7 x weekly - 3  sets - 10 reps - Supine Heel Slides  - 1 x daily - 7 x weekly - 3 sets - 10 reps   ASSESSMENT:   CLINICAL IMPRESSION: Increase in back pain after last PT session.  Has come down slightly from 9/10 to 5/10 last 2 days.  She does report standing for extended time cooking which may have exacerbated.  She also states overall she is better. Focus today on stretching and gentle movement/strengthening of core. Her gait is slighty guarded, cues for arm swing improve. Goals ongoing    OBJECTIVE IMPAIRMENTS decreased activity tolerance, difficulty walking, decreased ROM, decreased strength, and increased muscle spasms.    ACTIVITY LIMITATIONS lifting, bending, standing, and stairs   PARTICIPATION LIMITATIONS: community activity and yard work   PERSONAL FACTORS Age, Fitness, Past/current experiences, and Time since onset of injury/illness/exacerbation are also affecting patient's functional outcome.    REHAB POTENTIAL: Good   CLINICAL DECISION MAKING: Evolving/moderate complexity declining ability to stand   EVALUATION COMPLEXITY: Moderate     GOALS: Goals reviewed with patient? No   SHORT TERM GOALS: Target date: 10/25/2021   Patient will increase bilateral hip flexion to 105 degrees without pain  Baseline: Goal status: Achieved 110 bilateral    2.  Patient will increase gross bilateral leg strength by 5 lbs  Baseline:  Goal status: right stronger left weaker. Woodward Ku continue to monitor    3.  Patient will be independent with basic HEP on land and water  Baseline:  Goal  status: has basic HEP    LONG TERM GOALS: Target date: 09/21/2021   Patient will go up and down stairs to enter her house without pain  Baseline:  Goal status: will continue to work on this in the pool Ongoing   2.  Patient will return to walking >1 mile without pain  Baseline:  Goal status:  has mild pain    3.  Patient will have a comprehensive stretching/ land and water strengthening  program  Baseline:  Goal status: Developing/ Ongoing    PLAN: PT FREQUENCY: 2x/week   PT DURATION: 8 weeks   PLANNED INTERVENTIONS: Therapeutic exercises, Therapeutic activity, Neuromuscular re-education, Balance training, Gait training, Patient/Family education, Self Care, Joint mobilization, Aquatic Therapy, Dry Needling, Electrical stimulation, Cryotherapy, Moist heat, Ionotophoresis 4mg /ml Dexamethasone, and Manual therapy.   PLAN FOR NEXT SESSION: assess goals for recert.   Corrie Dandy) Ailish Prospero MPT 09/29/21 11:24 AM

## 2021-10-03 ENCOUNTER — Ambulatory Visit (HOSPITAL_BASED_OUTPATIENT_CLINIC_OR_DEPARTMENT_OTHER): Payer: Medicare Other | Admitting: Physical Therapy

## 2021-10-03 ENCOUNTER — Encounter (HOSPITAL_BASED_OUTPATIENT_CLINIC_OR_DEPARTMENT_OTHER): Payer: Self-pay | Admitting: Physical Therapy

## 2021-10-03 DIAGNOSIS — M5459 Other low back pain: Secondary | ICD-10-CM

## 2021-10-03 DIAGNOSIS — M5416 Radiculopathy, lumbar region: Secondary | ICD-10-CM

## 2021-10-03 DIAGNOSIS — R2689 Other abnormalities of gait and mobility: Secondary | ICD-10-CM

## 2021-10-03 NOTE — Therapy (Signed)
OUTPATIENT PHYSICAL THERAPY THORACOLUMBAR TREATMENT         Patient Name: [name] MRN: [id number] DOB:1945-08-16, 76 y.o., female Today's Date: 10/03/2021   PT End of Session - 10/03/21 1039     Visit Number 9    Number of Visits 16    Date for PT Re-Evaluation 11/08/21    Progress Note Due on Visit 17    PT Start Time 1035    PT Stop Time 1115    PT Time Calculation (min) 40 min                Past Medical History:  Diagnosis Date   Arthritis    Psoriasis    Intermittent; all over body and waxes and wanes   Tinnitus of right ear 2013   Intermittent; Melatonin has helped   Vaginal delivery 1973, 1981   Past Surgical History:  Procedure Laterality Date   goiter  1968   was on her Thyroid   IR KYPHO THORACIC WITH BONE BIOPSY  01/20/2019   JOINT REPLACEMENT     Knee replacement Right 2019   TOTAL HIP ARTHROPLASTY     Lt 2002, Rt 2005   VEIN LIGATION AND STRIPPING Right 2005   Patient Active Problem List   Diagnosis Date Noted   Hallux valgus, acquired 07/01/2019   Abnormal EKG 01/20/2019   Tinnitus of right ear    Psoriasis    Arthritis    Osteopenia 01/05/2019      PCP: [name], PA   REFERRING PROVIDER: Melina Fiddler, MD   REFERRING DIAG: pain, radiculopathy   Rationale for Evaluation and Treatment Rehabilitation   THERAPY DIAG:  No diagnosis found.   ONSET DATE: December 2022   SUBJECTIVE:                                                                                                                                                                                            SUBJECTIVE STATEMENT: Pt reports she usually feels pretty good after aquatic sessions.  She reports minor improvement since starting therapy. She reports she had some relief when suspended on noodle last session.    PERTINENT HISTORY:  Bil THA, R TKA, osteopenia   PAIN:  Are you having pain? no: NPRS scale: 3-4/10 Pain location: lower back  Pain  description: ache  Aggravating factors: ? Relieving factors: suspended on noodle     PRECAUTIONS: None   WEIGHT BEARING RESTRICTIONS No   FALLS:  Has patient fallen in last 6 months? No   LIVING ENVIRONMENT: Lives with: lives with their spouse Lives in: House/apartment Stairs: Yes: External: 3  steps   OCCUPATION: Retired   PLOF: Independent   PATIENT GOALS   Improve balance and walking, be able to garden and do yard work     OBJECTIVE:    DIAGNOSTIC FINDINGS:   Left knee x-ray: 2022  EXAM: LEFT KNEE - COMPLETE 4+ VIEW   COMPARISON:  None.   FINDINGS: No evidence of fracture, dislocation, or joint effusion. There is tricompartmental moderate joint space narrowing and osteophytes. No focal bone lesion. Regional soft tissues are unremarkable.   IMPRESSION: 1. No acute findings in the left knee. 2. Moderate tricompartmental osteoarthritis   PATIENT SURVEYS:  FOTO 47% baseline 46 percent expected din 1 visit    SCREENING FOR RED FLAGS: Bowel or bladder incontinence: No Spinal tumors: No Cauda equina syndrome: No Compression fracture: No Abdominal aneurysm: No   COGNITION:           Overall cognitive status: Within functional limits for tasks assessed                          SENSATION: WFL   POSTURE: rounded shoulders; stands with forward trunk flexion    PALPATION:     LUMBAR ROM:    Active  AROM  eval AROM  9/1/3  Flexion No significant limitation     Extension 50% limited but no pain   25%   Right lateral rotation Pain 25% limited   No significant pain   Left lateral rotation  No pain     Right rotation     Left rotation      (Blank rows = not tested)   LOWER EXTREMITY ROM:      MMT Right eval Left eval Right  9/13 Left 9/13  Hip flexion 20.6 23.6 22.7 14  Hip extension        Hip abduction 41.9 39.5 37.5 29  Hip adduction        Hip internal rotation        Hip external rotation        Knee flexion        Knee extension 37.1  35.0 41.4 28.4  Ankle dorsiflexion 33.3 35.4    Ankle plantarflexion        Ankle inversion        Ankle eversion         (Blank rows = not tested)   LOWER EXTREMITY MMT:     Active motion  Right eval Left eval Right  Left   Hip flexion 90 pain full 90 painful  110 with tailbone pain  110 with knee pain   Hip extension        Hip abduction        Hip adduction        Hip internal rotation        Hip external rotation        Knee flexion        Knee extension        Ankle dorsiflexion        Ankle plantarflexion        Ankle inversion        Ankle eversion         (Blank rows = not tested)   LUMBAR SPECIAL TESTS:  Straight leg raise test: Negative   FUNCTIONAL TESTS:  Berg Balance Scale: deferred to next visit   GAIT: Distance walked: flexed trunk  lateral movement noted       TODAY'S TREATMENT  Pt seen for aquatic therapy today.  Treatment took place in water 3.25-4.75ft depth at the [facility]. Temp of water was 92.  Pt entered/exited the pool independently via stairs/step to pattern with 2 hand rails rail   Holding white barbell for support:   forward/ backward walking;  side stepping;  High knee marching forward/ backward; squats  Straddling yellow noodle:  cycling with breast stroke arms  Holding wall at 574ft:  add/abd; hip extension x 15 each Low back stretch holding wall x 3 reps Staggered stance with kick board push/pull (kick board 1/2 submerged, core engaged) Lumbar rotation 2 x10  Bilat shoulder abdct/add x 10 using rainbow hand buoys With rainbow hand buoys at sides under water,  backward/ forward gait Return to indep cycling on yellow noodle   Pt requires the buoyancy and hydrostatic pressure of water for support, and to offload joints by unweighting joint load by at least 50 % in navel deep water and by at least 75-80% in chest to neck deep water.  Viscosity of the water is needed for resistance of strengthening. Water current  perturbations provides challenge to standing balance requiring increased core activation.    PATIENT EDUCATION:  Education details: aquatic progressions  Person educated: Patient Education method: Explanation handout  Education comprehension: verbalized understanding     HOME EXERCISE PROGRAM: Access Code: [email or web address] URL: [email or web address] Date: 07/27/2021 Prepared by: Lorayne Benderavid Carroll   Exercises - Supine Quad Set  - 1 x daily - 7 x weekly - 3 sets - 10 reps - Active Straight Leg Raise with Quad Set  - 1 x daily - 7 x weekly - 3 sets - 10 reps - Supine Heel Slides  - 1 x daily - 7 x weekly - 3 sets - 10 reps   ASSESSMENT:   CLINICAL IMPRESSION: Pt report tightening across low back with hip ext and gait with rainbow hand buoys under water; reduced with lower back stretch holding wall.  Pt reports no change in back pain when in water today.  Pt making gradual progress towards PT goals; will benefit from continued PT intervention to maximize functional mobility with less pain.      OBJECTIVE IMPAIRMENTS decreased activity tolerance, difficulty walking, decreased ROM, decreased strength, and increased muscle spasms.    ACTIVITY LIMITATIONS lifting, bending, standing, and stairs   PARTICIPATION LIMITATIONS: community activity and yard work   PERSONAL FACTORS Age, Fitness, Past/current experiences, and Time since onset of injury/illness/exacerbation are also affecting patient's functional outcome.    REHAB POTENTIAL: Good   CLINICAL DECISION MAKING: Evolving/moderate complexity declining ability to stand   EVALUATION COMPLEXITY: Moderate     GOALS: Goals reviewed with patient? No   SHORT TERM GOALS: Target date: 10/25/2021   Patient will increase bilateral hip flexion to 105 degrees without pain  Baseline: Goal status: Achieved 110 bilateral    2.  Patient will increase gross bilateral leg strength by 5 lbs  Baseline: right stronger left weaker. Will  continue to monitor  Goal status: Ongoing   3.  Patient will be independent with basic HEP on land and water  Baseline: has basic HEP Goal status:  Achieved    LONG TERM GOALS: Target date: 09/21/2021   Patient will go up and down stairs to enter her house without pain  Baseline:  Goal status: will continue to work on this in the pool Ongoing   2.  Patient will return to walking >1 mile without pain  Baseline:  Goal status:  has mild pain    3.  Patient will have a comprehensive stretching/ land and water strengthening  program  Baseline:  Goal status: Developing/ Ongoing    PLAN: PT FREQUENCY: 2x/week   PT DURATION: 8 weeks   PLANNED INTERVENTIONS: Therapeutic exercises, Therapeutic activity, Neuromuscular re-education, Balance training, Gait training, Patient/Family education, Self Care, Joint mobilization, Aquatic Therapy, Dry Needling, Electrical stimulation, Cryotherapy, Moist heat, Ionotophoresis 4mg /ml Dexamethasone, and Manual therapy.   PLAN FOR NEXT SESSION: did she check out aquatic center yet?  Continue progressive aquatic strengthening exercises.  , PTA 10/03/21 11:16 AM [facility] [address] [place], [place], [place] Phone: [phone]   Fax:  [phone]

## 2021-10-09 ENCOUNTER — Encounter: Payer: Self-pay | Admitting: *Deleted

## 2021-10-13 ENCOUNTER — Ambulatory Visit (HOSPITAL_BASED_OUTPATIENT_CLINIC_OR_DEPARTMENT_OTHER): Payer: Medicare Other | Admitting: Physical Therapy

## 2021-10-13 DIAGNOSIS — R2689 Other abnormalities of gait and mobility: Secondary | ICD-10-CM

## 2021-10-13 DIAGNOSIS — M5459 Other low back pain: Secondary | ICD-10-CM | POA: Diagnosis not present

## 2021-10-13 DIAGNOSIS — M5416 Radiculopathy, lumbar region: Secondary | ICD-10-CM

## 2021-10-13 NOTE — Therapy (Signed)
10/13/21 OUTPATIENT PHYSICAL THERAPY THORACOLUMBAR TREATMENT     Progress Note Reporting Period 07/27/21 to 10/13/21  See note below for Objective Data and Assessment of Progress/Goals.     Patient Name: [name] MRN: [id number] DOB:12-Feb-1945, 76 y.o., female Today's Date: 10/13/2021   PT End of Session - 10/13/21 0921     Visit Number 10    Number of Visits 16    Date for PT Re-Evaluation 11/08/21    Authorization Type progress note done on visit 7    Progress Note Due on Visit 17    PT Start Time 0910    PT Stop Time 0951    PT Time Calculation (min) 41 min    Activity Tolerance Patient tolerated treatment well    Behavior During Therapy [facility] for tasks assessed/performed                 Past Medical History:  Diagnosis Date   Arthritis    Psoriasis    Intermittent; all over body and waxes and wanes   Tinnitus of right ear 2013   Intermittent; Melatonin has helped   Vaginal delivery 1973, 1981   Past Surgical History:  Procedure Laterality Date   goiter  1968   was on her Thyroid   IR KYPHO THORACIC WITH BONE BIOPSY  01/20/2019   JOINT REPLACEMENT     Knee replacement Right 2019   TOTAL HIP ARTHROPLASTY     Lt 2002, Rt 2005   VEIN LIGATION AND STRIPPING Right 2005   Patient Active Problem List   Diagnosis Date Noted   Hallux valgus, acquired 07/01/2019   Abnormal EKG 01/20/2019   Tinnitus of right ear    Psoriasis    Arthritis    Osteopenia 01/05/2019      PCP: [name], PA   REFERRING PROVIDER: Melina Fiddler, MD   REFERRING DIAG: pain, radiculopathy   Rationale for Evaluation and Treatment Rehabilitation   THERAPY DIAG:  No diagnosis found.   ONSET DATE: December 2022   SUBJECTIVE:                                                                                                                                                                                            SUBJECTIVE STATEMENT: Pt reports increased in LBP after  having friends over for dinner last night and standing longer time cooking and serving.  Have not yet checked out  [facility]   PERTINENT HISTORY:  Bil THA, R TKA, osteopenia   PAIN:  Are you having pain? no: NPRS scale: 3-4/10 Pain location: lower back  Pain description: ache  Aggravating factors:  standing Relieving factors: suspended on noodle     PRECAUTIONS: None   WEIGHT BEARING RESTRICTIONS No   FALLS:  Has patient fallen in last 6 months? No   LIVING ENVIRONMENT: Lives with: lives with their spouse Lives in: House/apartment Stairs: Yes: External: 3 steps   OCCUPATION: Retired   PLOF: Independent   PATIENT GOALS   Improve balance and walking, be able to garden and do yard work     OBJECTIVE:    DIAGNOSTIC FINDINGS:   Left knee x-ray: 2022  EXAM: LEFT KNEE - COMPLETE 4+ VIEW   COMPARISON:  None.   FINDINGS: No evidence of fracture, dislocation, or joint effusion. There is tricompartmental moderate joint space narrowing and osteophytes. No focal bone lesion. Regional soft tissues are unremarkable.   IMPRESSION: 1. No acute findings in the left knee. 2. Moderate tricompartmental osteoarthritis   PATIENT SURVEYS:  FOTO 47% baseline 46 percent expected din 1 visit    SCREENING FOR RED FLAGS: Bowel or bladder incontinence: No Spinal tumors: No Cauda equina syndrome: No Compression fracture: No Abdominal aneurysm: No   COGNITION:           Overall cognitive status: Within functional limits for tasks assessed                          SENSATION: WFL   POSTURE: rounded shoulders; stands with forward trunk flexion    PALPATION:     LUMBAR ROM:    Active  AROM  eval AROM  9/1/3 AROM 9/29  Flexion No significant limitation    wfl  Extension 50% limited but no pain   25%  WFL  Right lateral rotation Pain 25% limited   No significant pain  25% without P!  Left lateral rotation  No pain      Right rotation      Left rotation       (Blank rows = not  tested)   LOWER EXTREMITY ROM:      MMT Right eval Left eval Right  9/13 Left 9/13 Right/Left  Hip flexion 20.6 23.6 22.7 14 37.8 / 33.5  Hip extension         Hip abduction 41.9 39.5 37.5 29 30  / 29  Hip adduction         Hip internal rotation         Hip external rotation         Knee flexion         Knee extension 37.1 35.0 41.4 28.4 38.2 / 34.1   Ankle dorsiflexion 33.3 35.4     Ankle plantarflexion         Ankle inversion         Ankle eversion          (Blank rows = not tested)   LOWER EXTREMITY MMT:     Active motion  Right eval Left eval Right  Left  Right /Left 10/13/21  Hip flexion 90 pain full 90 painful  110 with tailbone pain  110 with knee pain  110/ 110 tailbone P!  Hip extension         Hip abduction         Hip adduction         Hip internal rotation         Hip external rotation         Knee flexion         Knee extension  Ankle dorsiflexion         Ankle plantarflexion         Ankle inversion         Ankle eversion          (Blank rows = not tested)   LUMBAR SPECIAL TESTS:  Straight leg raise test: Negative   FUNCTIONAL TESTS:  Berg Balance Scale: deferred to next visit      10/13/21 [name]: 38/56  GAIT: Distance walked: flexed trunk  lateral movement noted       TODAY'S TREATMENT  Pt seen for aquatic therapy today.  Treatment took place in water 3.25-4.75ft depth at the [facility] pool. Temp of water was 92.  Pt entered/exited the pool independently via stairs/step to pattern with 2 hand rails rail   Holding yellow noodle for support:   forward/ backward walking;  side stepping;  High knee marching forward/ backward; With rainbow hand buoys at sides under water,  backward/ forward gait x 4-6 widths  Straddling yellow noodle:  cycling with breast stroke arms  Holding 1 foam hand buoys 72ft:  add/abd; hip extension; hip flex then extension; squats x 15 each Low back stretch holding wall x 3 reps; lumbar rotation  Staggered  stance with kick board push/pull (kick board 1/2 submerged, core engaged) Lumbar rotation 2 x10  Bilat shoulder abdct/add x 10 using rainbow hand buoys  Return to indep cycling on yellow noodle   Pt requires the buoyancy and hydrostatic pressure of water for support, and to offload joints by unweighting joint load by at least 50 % in navel deep water and by at least 75-80% in chest to neck deep water.  Viscosity of the water is needed for resistance of strengthening. Water current perturbations provides challenge to standing balance requiring increased core activation.    PATIENT EDUCATION:  Education details: aquatic progressions  Person educated: Patient Education method: Explanation handout  Education comprehension: verbalized understanding     HOME EXERCISE PROGRAM: Access Code: [email or web address] URL: [email or web address] Date: 07/27/2021 Prepared by: Lorayne Bender   Exercises - Supine Quad Set  - 1 x daily - 7 x weekly - 3 sets - 10 reps - Active Straight Leg Raise with Quad Set  - 1 x daily - 7 x weekly - 3 sets - 10 reps - Supine Heel Slides  - 1 x daily - 7 x weekly - 3 sets - 10 reps   ASSESSMENT:   CLINICAL IMPRESSION: PN: Pt reports improvements in pain in and function but continues to have difficulty as day progresses with increasing back pain.  She feels she is still unsteady when walking across floors.  She has demonstrated improvements with lumbar ROM with decreased pain and strength through LE. (There are some differences in strength testing due to inter-tester reliability). She reports continued pain with stair climbing but it is easier to complete. Berg Balance test completed and scored: 38/56 indicating a moderate fall risk.  She will continue to benefit from aquatic PT to further improve strength, manage pain and improved balance for decreased fall risk.       OBJECTIVE IMPAIRMENTS decreased activity tolerance, difficulty walking, decreased ROM, decreased  strength, and increased muscle spasms.    ACTIVITY LIMITATIONS lifting, bending, standing, and stairs   PARTICIPATION LIMITATIONS: community activity and yard work   PERSONAL FACTORS Age, Fitness, Past/current experiences, and Time since onset of injury/illness/exacerbation are also affecting patient's functional outcome.    REHAB POTENTIAL: Good   CLINICAL DECISION MAKING: Evolving/moderate  complexity declining ability to stand   EVALUATION COMPLEXITY: Moderate     GOALS: Goals reviewed with patient? No   SHORT TERM GOALS: Target date: 10/25/2021   Patient will increase bilateral hip flexion to 105 degrees without pain  Baseline: Goal status: Achieved 110 bilateral    2.  Patient will increase gross bilateral leg strength by 5 lbs  Baseline: right stronger left weaker. Will continue to monitor  Goal status: Ongoing   3.  Patient will be independent with basic HEP on land and water  Baseline: has basic HEP Goal status:  Achieved    LONG TERM GOALS: Target date: 09/21/2021   Patient will go up and down stairs to enter her house without pain  Baseline:  Goal status:Ongoing   2.  Patient will return to walking >1 mile without pain  Baseline:  Goal status:  has mild pain    3.  Patient will have a comprehensive stretching/ land and water strengthening  program  Baseline:  Goal status: Developing/ Ongoing         PLAN: PT FREQUENCY: 2x/week   PT DURATION: 8 weeks   PLANNED INTERVENTIONS: Therapeutic exercises, Therapeutic activity, Neuromuscular re-education, Balance training, Gait training, Patient/Family education, Self Care, Joint mobilization, Aquatic Therapy, Dry Needling, Electrical stimulation, Cryotherapy, Moist heat, Ionotophoresis 4mg /ml Dexamethasone, and Manual therapy.   PLAN FOR NEXT SESSION: did she check out aquatic center yet?  Continue progressive aquatic strengthening exercises.  Stanton Kidney [place]) Inari Shin MPT 10/13/21 12:05 PM [facility] [address] [place], [place], [address] Phone: [phone]   Fax:  [phone]

## 2021-10-17 ENCOUNTER — Ambulatory Visit (HOSPITAL_BASED_OUTPATIENT_CLINIC_OR_DEPARTMENT_OTHER): Payer: Medicare Other | Attending: Sports Medicine | Admitting: Physical Therapy

## 2021-10-17 ENCOUNTER — Encounter (HOSPITAL_BASED_OUTPATIENT_CLINIC_OR_DEPARTMENT_OTHER): Payer: Self-pay | Admitting: Physical Therapy

## 2021-10-17 DIAGNOSIS — R2689 Other abnormalities of gait and mobility: Secondary | ICD-10-CM | POA: Diagnosis present

## 2021-10-17 DIAGNOSIS — M5459 Other low back pain: Secondary | ICD-10-CM | POA: Insufficient documentation

## 2021-10-17 DIAGNOSIS — M5416 Radiculopathy, lumbar region: Secondary | ICD-10-CM | POA: Diagnosis present

## 2021-10-17 NOTE — Therapy (Signed)
OUTPATIENT PHYSICAL THERAPY THORACOLUMBAR TREATMENT       Patient Name: [name] MRN: [id number] DOB:[birth year], 76 y.o., female Today's Date: 10/17/2021   PT End of Session - 10/17/21 0955     Visit Number 11    Number of Visits 16    Date for PT Re-Evaluation 11/08/21    Authorization Type progress note done on visit 7    Progress Note Due on Visit 17    PT Start Time 0948    PT Stop Time 1029    PT Time Calculation (min) 41 min    Activity Tolerance Patient tolerated treatment well    Behavior During Therapy [facility] for tasks assessed/performed                 Past Medical History:  Diagnosis Date   Arthritis    Psoriasis    Intermittent; all over body and waxes and wanes   Tinnitus of right ear 2013   Intermittent; Melatonin has helped   Vaginal delivery 1973, 1981   Past Surgical History:  Procedure Laterality Date   goiter  1968   was on her Thyroid   IR KYPHO THORACIC WITH BONE BIOPSY  01/20/2019   JOINT REPLACEMENT     Knee replacement Right 2019   TOTAL HIP ARTHROPLASTY     Lt 2002, Rt 2005   VEIN LIGATION AND STRIPPING Right 2005   Patient Active Problem List   Diagnosis Date Noted   Hallux valgus, acquired 07/01/2019   Abnormal EKG 01/20/2019   Tinnitus of right ear    Psoriasis    Arthritis    Osteopenia 01/05/2019      PCP: Inda Coke, PA   REFERRING PROVIDER: Verner Chol, MD   REFERRING DIAG: pain, radiculopathy   Rationale for Evaluation and Treatment Rehabilitation   THERAPY DIAG:  No diagnosis found.   ONSET DATE: December 2022   SUBJECTIVE:                                                                                                                                                                                            SUBJECTIVE STATEMENT: Pt reports she didn't check out [facility]; she doesn't feel like she could afford classes.  She'd like to get back to walking in her neighborhood.  She may check out [employer] and  see if they take her insurance.   PERTINENT HISTORY:  Bil THA, R TKA, osteopenia   PAIN:  Are you having pain? Yes : NPRS scale: 2-3/10  "not bad"  Pain location: lower back  Pain description: ache  Aggravating factors: standing Relieving factors: suspended on  noodle     PRECAUTIONS: None   WEIGHT BEARING RESTRICTIONS No   FALLS:  Has patient fallen in last 6 months? No   LIVING ENVIRONMENT: Lives with: lives with their spouse Lives in: House/apartment Stairs: Yes: External: 3 steps   OCCUPATION: Retired   PLOF: Independent   PATIENT GOALS   Improve balance and walking, be able to garden and do yard work     OBJECTIVE:    DIAGNOSTIC FINDINGS:   Left knee x-ray: 2022  EXAM: LEFT KNEE - COMPLETE 4+ VIEW   COMPARISON:  None.   FINDINGS: No evidence of fracture, dislocation, or joint effusion. There is tricompartmental moderate joint space narrowing and osteophytes. No focal bone lesion. Regional soft tissues are unremarkable.   IMPRESSION: 1. No acute findings in the left knee. 2. Moderate tricompartmental osteoarthritis   PATIENT SURVEYS:  FOTO 47% baseline 46 percent expected din 1 visit    SCREENING FOR RED FLAGS: Bowel or bladder incontinence: No Spinal tumors: No Cauda equina syndrome: No Compression fracture: No Abdominal aneurysm: No   COGNITION:           Overall cognitive status: Within functional limits for tasks assessed                          SENSATION: WFL   POSTURE: rounded shoulders; stands with forward trunk flexion    PALPATION:     LUMBAR ROM:    Active  AROM  eval AROM  9/1/3 AROM 9/29  Flexion No significant limitation    wfl  Extension 50% limited but no pain   25%  WFL  Right lateral rotation Pain 25% limited   No significant pain  25% without P!  Left lateral rotation  No pain      Right rotation      Left rotation       (Blank rows = not tested)   LOWER EXTREMITY ROM:      MMT Right eval Left eval Right   9/13 Left 9/13 Right/Left  Hip flexion 20.6 23.6 22.7 14 37.8 / 33.5  Hip extension         Hip abduction 41.9 39.5 37.5 29 30  / 29  Hip adduction         Hip internal rotation         Hip external rotation         Knee flexion         Knee extension 37.1 35.0 41.4 28.4 38.2 / 34.1   Ankle dorsiflexion 33.3 35.4     Ankle plantarflexion         Ankle inversion         Ankle eversion          (Blank rows = not tested)   LOWER EXTREMITY MMT:     Active motion  Right eval Left eval Right  Left  Right /Left 10/13/21  Hip flexion 90 pain full 90 painful  110 with tailbone pain  110 with knee pain  110/ 110 tailbone P!  Hip extension         Hip abduction         Hip adduction         Hip internal rotation         Hip external rotation         Knee flexion         Knee extension  Ankle dorsiflexion         Ankle plantarflexion         Ankle inversion         Ankle eversion          (Blank rows = not tested)   LUMBAR SPECIAL TESTS:  Straight leg raise test: Negative   FUNCTIONAL TESTS:  Berg Balance Scale: deferred to next visit      10/13/21 Berg: 38/56  BERG Balance Test          Date: 10/13/21  Sit to Stand 3  Standing unsupported 4  Sitting with back unsupported but feet supported 4  Stand to sit  3  Transfers  3  Standing unsupported with eyes closed 3  Standing unsupported feet together 4  From standing position, reach forward with outstretched arm 4  From standing position, pick up object from floor 4  From standing position, turn and look behind over each shoulder 1  Turn 360 2  Standing unsupported, alternately place foot on step 3  Standing unsupported, one foot in front 0  Standing on one leg 0  Total:  38     GAIT: Distance walked: flexed trunk  lateral movement noted       TODAY'S TREATMENT  Pt seen for aquatic therapy today.  Treatment took place in water 3.25-4.75ft depth at the [facility] pool. Temp of water was 92.  Pt  entered/exited the pool independently via stairs/step to pattern with 2 hand rails rail   Holding yellow noodle for support:   forward/ backward walking;  side stepping;  High knee marching forward/ backward; Bilat shoulder abdct/add x 10 using rainbow hand buoys Staggered stance with kick board push/pull (kick board 1/2 submerged, core engaged) Lumbar rotation with hands on kick board Thin square noodle pull downs x 10 Forward step ups x 10 each leg with BUE on rails With rainbow hand buoys at sides under water,  backward/ forward gait x 4 widths Holding rainbow hand buoys on surface :  SLS x 15 s;  tandem stance x 20s  Straddling yellow noodle:  cycling  Warrior 1 gentle lifts of noodle off of water x 5 each (unsteady) Return to walking forward/ backward  Low back stretch holding wall     Pt requires the buoyancy and hydrostatic pressure of water for support, and to offload joints by unweighting joint load by at least 50 % in navel deep water and by at least 75-80% in chest to neck deep water.  Viscosity of the water is needed for resistance of strengthening. Water current perturbations provides challenge to standing balance requiring increased core activation.    PATIENT EDUCATION:  Education details: aquatic progressions  Person educated: Patient Education method: Explanation handout  Education comprehension: verbalized understanding     HOME EXERCISE PROGRAM: Access Code: [email or web address] URL: [email or web address] Date: 07/27/2021 Prepared by: Carolyne Littles   Exercises - Supine Quad Set  - 1 x daily - 7 x weekly - 3 sets - 10 reps - Active Straight Leg Raise with Quad Set  - 1 x daily - 7 x weekly - 3 sets - 10 reps - Supine Heel Slides  - 1 x daily - 7 x weekly - 3 sets - 10 reps   ASSESSMENT:   CLINICAL IMPRESSION: Pt reported greater ease with completing forward step ups than during previous sessions. She reported increased back pain to 5/10 with tandem stance  at 4 ft; reduced with cycling in deeper water.  Will plan to create aquatic HEP over next few visits. Pt to check out [employer] near her house today.   She will continue to benefit from aquatic PT to further improve strength, manage pain and improved balance for decreased fall risk.     OBJECTIVE IMPAIRMENTS decreased activity tolerance, difficulty walking, decreased ROM, decreased strength, and increased muscle spasms.    ACTIVITY LIMITATIONS lifting, bending, standing, and stairs   PARTICIPATION LIMITATIONS: community activity and yard work   PERSONAL FACTORS Age, Fitness, Past/current experiences, and Time since onset of injury/illness/exacerbation are also affecting patient's functional outcome.    REHAB POTENTIAL: Good   CLINICAL DECISION MAKING: Evolving/moderate complexity declining ability to stand   EVALUATION COMPLEXITY: Moderate     GOALS: Goals reviewed with patient? No   SHORT TERM GOALS: Target date: 10/25/2021   Patient will increase bilateral hip flexion to 105 degrees without pain  Baseline: Goal status: Achieved 110 bilateral    2.  Patient will increase gross bilateral leg strength by 5 lbs  Baseline: right stronger left weaker. Will continue to monitor  Goal status: Ongoing   3.  Patient will be independent with basic HEP on land and water  Baseline: has basic HEP Goal status:  Achieved    LONG TERM GOALS: Target date: 09/21/2021   Patient will go up and down stairs to enter her house without pain  Baseline:  Goal status:Ongoing   2.  Patient will return to walking >1 mile without pain  Baseline:  Goal status:  has mild pain    3.  Patient will have a comprehensive stretching/ land and water strengthening  program  Baseline:  Goal status: Developing/ Ongoing         PLAN: PT FREQUENCY: 2x/week   PT DURATION: 8 weeks   PLANNED INTERVENTIONS: Therapeutic exercises, Therapeutic activity, Neuromuscular re-education, Balance training, Gait training,  Patient/Family education, Self Care, Joint mobilization, Aquatic Therapy, Dry Needling, Electrical stimulation, Cryotherapy, Moist heat, Ionotophoresis 4mg /ml Dexamethasone, and Manual therapy.   PLAN FOR NEXT SESSION: Continue progressive aquatic strengthening exercises. [facility] she check out [facility]?    Kerin Perna, PTA 10/17/21 10:29 AM [facility] [address] [place], [place], [address] Phone: [phone]   Fax:  [phone]

## 2021-10-19 ENCOUNTER — Ambulatory Visit (HOSPITAL_BASED_OUTPATIENT_CLINIC_OR_DEPARTMENT_OTHER): Payer: Medicare Other | Admitting: Physical Therapy

## 2021-10-19 ENCOUNTER — Encounter (HOSPITAL_BASED_OUTPATIENT_CLINIC_OR_DEPARTMENT_OTHER): Payer: Self-pay | Admitting: Physical Therapy

## 2021-10-19 DIAGNOSIS — M5416 Radiculopathy, lumbar region: Secondary | ICD-10-CM

## 2021-10-19 DIAGNOSIS — R2689 Other abnormalities of gait and mobility: Secondary | ICD-10-CM

## 2021-10-19 DIAGNOSIS — M5459 Other low back pain: Secondary | ICD-10-CM

## 2021-10-19 NOTE — Therapy (Signed)
OUTPATIENT PHYSICAL THERAPY THORACOLUMBAR TREATMENT       Patient Name: [name] MRN: [id number] DOB:[birth year], 76 y.o., female Today's Date: 10/19/2021   PT End of Session - 10/19/21 1559     Visit Number 12    Number of Visits 16    Date for PT Re-Evaluation 11/08/21    Authorization Type progress note done on visit 7    Progress Note Due on Visit 17    PT Start Time 1535    PT Stop Time 1615    PT Time Calculation (min) 40 min    Activity Tolerance Patient tolerated treatment well    Behavior During Therapy WFL for tasks assessed/performed                  Past Medical History:  Diagnosis Date   Arthritis    Psoriasis    Intermittent; all over body and waxes and wanes   Tinnitus of right ear 2013   Intermittent; Melatonin has helped   Vaginal delivery 1973, 1981   Past Surgical History:  Procedure Laterality Date   goiter  1968   was on her Thyroid   IR KYPHO THORACIC WITH BONE BIOPSY  01/20/2019   JOINT REPLACEMENT     Knee replacement Right 2019   TOTAL HIP ARTHROPLASTY     Lt 2002, Rt 2005   VEIN LIGATION AND STRIPPING Right 2005   Patient Active Problem List   Diagnosis Date Noted   Hallux valgus, acquired 07/01/2019   Abnormal EKG 01/20/2019   Tinnitus of right ear    Psoriasis    Arthritis    Osteopenia 01/05/2019      PCP: [name], PA   REFERRING PROVIDER: Melina Fiddler, MD   REFERRING DIAG: pain, radiculopathy   Rationale for Evaluation and Treatment Rehabilitation   THERAPY DIAG:  No diagnosis found.   ONSET DATE: December 2022   SUBJECTIVE:                                                                                                                                                                                            SUBJECTIVE STATEMENT: My toes have been prickly and burning at night when I get off my feet for the past few months by morning it is gone. Walked for 20 minutes last night and I  noticed  my right calf where I had the old wound ached with the prickly and burning sensation in toes.   PERTINENT HISTORY:  Bil THA, R TKA, osteopenia   PAIN:  Are you having pain? Yes : NPRS scale: 2-3/10  "not bad"  Pain  location: lower back  Pain description: ache  Aggravating factors: standing Relieving factors: suspended on noodle     PRECAUTIONS: None   WEIGHT BEARING RESTRICTIONS No   FALLS:  Has patient fallen in last 6 months? No   LIVING ENVIRONMENT: Lives with: lives with their spouse Lives in: House/apartment Stairs: Yes: External: 3 steps   OCCUPATION: Retired   PLOF: Independent   PATIENT GOALS   Improve balance and walking, be able to garden and do yard work     OBJECTIVE:    DIAGNOSTIC FINDINGS:   Left knee x-ray: 2022  EXAM: LEFT KNEE - COMPLETE 4+ VIEW   COMPARISON:  None.   FINDINGS: No evidence of fracture, dislocation, or joint effusion. There is tricompartmental moderate joint space narrowing and osteophytes. No focal bone lesion. Regional soft tissues are unremarkable.   IMPRESSION: 1. No acute findings in the left knee. 2. Moderate tricompartmental osteoarthritis   PATIENT SURVEYS:  FOTO 47% baseline 46 percent expected din 1 visit    SCREENING FOR RED FLAGS: Bowel or bladder incontinence: No Spinal tumors: No Cauda equina syndrome: No Compression fracture: No Abdominal aneurysm: No   COGNITION:           Overall cognitive status: Within functional limits for tasks assessed                          SENSATION: WFL   POSTURE: rounded shoulders; stands with forward trunk flexion    PALPATION:     LUMBAR ROM:    Active  AROM  eval AROM  9/1/3 AROM 9/29  Flexion No significant limitation    wfl  Extension 50% limited but no pain   25%  WFL  Right lateral rotation Pain 25% limited   No significant pain  25% without P!  Left lateral rotation  No pain      Right rotation      Left rotation       (Blank rows = not tested)    LOWER EXTREMITY ROM:      MMT Right eval Left eval Right  9/13 Left 9/13 Right/Left  Hip flexion 20.6 23.6 22.7 14 37.8 / 33.5  Hip extension         Hip abduction 41.9 39.5 37.5 29 30  / 29  Hip adduction         Hip internal rotation         Hip external rotation         Knee flexion         Knee extension 37.1 35.0 41.4 28.4 38.2 / 34.1   Ankle dorsiflexion 33.3 35.4     Ankle plantarflexion         Ankle inversion         Ankle eversion          (Blank rows = not tested)   LOWER EXTREMITY MMT:     Active motion  Right eval Left eval Right  Left  Right /Left 10/13/21  Hip flexion 90 pain full 90 painful  110 with tailbone pain  110 with knee pain  110/ 110 tailbone P!  Hip extension         Hip abduction         Hip adduction         Hip internal rotation         Hip external rotation         Knee flexion  Knee extension         Ankle dorsiflexion         Ankle plantarflexion         Ankle inversion         Ankle eversion          (Blank rows = not tested)   LUMBAR SPECIAL TESTS:  Straight leg raise test: Negative   FUNCTIONAL TESTS:  Berg Balance Scale: deferred to next visit      10/13/21 Berg: 38/56  BERG Balance Test          Date: 10/13/21  Sit to Stand 3  Standing unsupported 4  Sitting with back unsupported but feet supported 4  Stand to sit  3  Transfers  3  Standing unsupported with eyes closed 3  Standing unsupported feet together 4  From standing position, reach forward with outstretched arm 4  From standing position, pick up object from floor 4  From standing position, turn and look behind over each shoulder 1  Turn 360 2  Standing unsupported, alternately place foot on step 3  Standing unsupported, one foot in front 0  Standing on one leg 0  Total:  38     GAIT: Distance walked: flexed trunk  lateral movement noted       TODAY'S TREATMENT  Pt seen for aquatic therapy today.  Treatment took place in water 3.25-4.75ft depth  at the [facility] pool. Temp of water was 92.  Pt entered/exited the pool independently via stairs/step to pattern with 2 hand rails rail   Holding yellow noodle for support:   forward/ backward walking;  side stepping;  High knee marching forward/ backwardWith rainbow hand buoys at sides under water,  backward/ forward gait x 4 widths Bilat shoulder abdct/add 2x 10 using rainbow hand buoys in SLS R/L tandem stance with kick board push/pull (kick board 1/2 submerged, core engaged) leading R/L x 15 (difficult) Lumbar rotation with hands on kick board Thin square noodle pull downs x 10 Forward step ups x 10 each leg with BUE on rails Stair climbing 7 step with reciprocal pattern    Straddling yellow noodle:  cycling  Warrior 1 gentle lifts of noodle off of water x 5 each (less unsteady) Return to walking forward/ backward  Low back stretch holding wall     Pt requires the buoyancy and hydrostatic pressure of water for support, and to offload joints by unweighting joint load by at least 50 % in navel deep water and by at least 75-80% in chest to neck deep water.  Viscosity of the water is needed for resistance of strengthening. Water current perturbations provides challenge to standing balance requiring increased core activation.    PATIENT EDUCATION:  Education details: aquatic progressions  Person educated: Patient Education method: Explanation handout  Education comprehension: verbalized understanding     HOME EXERCISE PROGRAM: Access Code: [email or web address] URL: [email or web address] Date: 07/27/2021 Prepared by: Carolyne Littles   Exercises - Supine Quad Set  - 1 x daily - 7 x weekly - 3 sets - 10 reps - Active Straight Leg Raise with Quad Set  - 1 x daily - 7 x weekly - 3 sets - 10 reps - Supine Heel Slides  - 1 x daily - 7 x weekly - 3 sets - 10 reps   ASSESSMENT:   CLINICAL IMPRESSION: Pt has not checked on [employer]. New neurologically based complaints of pain  in toes with duration of past few months.  She is  encouraged to inform/see MD in regards. She has progressed with 2 LTG with amb  20  mins (maybe 1 mile) yesterday and today reporting climbing 5 stairs to her back door multiple times daily using reciprocal pattern although with some pain although less. Goals ongoing      OBJECTIVE IMPAIRMENTS decreased activity tolerance, difficulty walking, decreased ROM, decreased strength, and increased muscle spasms.    ACTIVITY LIMITATIONS lifting, bending, standing, and stairs   PARTICIPATION LIMITATIONS: community activity and yard work   PERSONAL FACTORS Age, Fitness, Past/current experiences, and Time since onset of injury/illness/exacerbation are also affecting patient's functional outcome.    REHAB POTENTIAL: Good   CLINICAL DECISION MAKING: Evolving/moderate complexity declining ability to stand   EVALUATION COMPLEXITY: Moderate     GOALS: Goals reviewed with patient? No   SHORT TERM GOALS: Target date: 10/25/2021   Patient will increase bilateral hip flexion to 105 degrees without pain  Baseline: Goal status: Achieved 110 bilateral    2.  Patient will increase gross bilateral leg strength by 5 lbs  Baseline: right stronger left weaker. Will continue to monitor  Goal status: Ongoing   3.  Patient will be independent with basic HEP on land and water  Baseline: has basic HEP Goal status:  Achieved    LONG TERM GOALS: Target date: 09/21/2021   Patient will go up and down stairs to enter her house without pain  Baseline:  Goal status:Ongoing   2.  Patient will return to walking >1 mile without pain  Baseline:  Goal status:  has mild pain    3.  Patient will have a comprehensive stretching/ land and water strengthening  program  Baseline:  Goal status: Developing/ Ongoing         PLAN: PT FREQUENCY: 2x/week   PT DURATION: 8 weeks   PLANNED INTERVENTIONS: Therapeutic exercises, Therapeutic activity, Neuromuscular  re-education, Balance training, Gait training, Patient/Family education, Self Care, Joint mobilization, Aquatic Therapy, Dry Needling, Electrical stimulation, Cryotherapy, Moist heat, Ionotophoresis 4mg /ml Dexamethasone, and Manual therapy.   PLAN FOR NEXT SESSION: Continue progressive aquatic strengthening exercises. Did she check out [facility]?  [place]) Hakan Nudelman MPT 10/19/21 4:38 PM [facility] [address] [place], [place], [place] Phone: [phone]   Fax:  [phone]

## 2021-10-24 ENCOUNTER — Encounter (HOSPITAL_BASED_OUTPATIENT_CLINIC_OR_DEPARTMENT_OTHER): Payer: Self-pay | Admitting: Physical Therapy

## 2021-10-24 ENCOUNTER — Ambulatory Visit (HOSPITAL_BASED_OUTPATIENT_CLINIC_OR_DEPARTMENT_OTHER): Payer: Medicare Other | Admitting: Physical Therapy

## 2021-10-24 DIAGNOSIS — M5416 Radiculopathy, lumbar region: Secondary | ICD-10-CM

## 2021-10-24 DIAGNOSIS — M5459 Other low back pain: Secondary | ICD-10-CM

## 2021-10-24 DIAGNOSIS — R2689 Other abnormalities of gait and mobility: Secondary | ICD-10-CM

## 2021-10-24 NOTE — Therapy (Signed)
OUTPATIENT PHYSICAL THERAPY THORACOLUMBAR TREATMENT       Patient Name: [name] MRN: [id number] DOB:11/28/1945, 76 y.o., female Today's Date: 10/24/2021   PT End of Session - 10/24/21 0946     Visit Number 13    Number of Visits 16    Date for PT Re-Evaluation 11/08/21    Authorization Type progress note done on visit 7    Progress Note Due on Visit 17    PT Start Time 0945    PT Stop Time 1025    PT Time Calculation (min) 40 min    Activity Tolerance Patient tolerated treatment well    Behavior During Therapy WFL for tasks assessed/performed                  Past Medical History:  Diagnosis Date   Arthritis    Psoriasis    Intermittent; all over body and waxes and wanes   Tinnitus of right ear 2013   Intermittent; Melatonin has helped   Vaginal delivery 1973, 1981   Past Surgical History:  Procedure Laterality Date   goiter  1968   was on her Thyroid   IR KYPHO THORACIC WITH BONE BIOPSY  01/20/2019   JOINT REPLACEMENT     Knee replacement Right 2019   TOTAL HIP ARTHROPLASTY     Lt 2002, Rt 2005   VEIN LIGATION AND STRIPPING Right 2005   Patient Active Problem List   Diagnosis Date Noted   Hallux valgus, acquired 07/01/2019   Abnormal EKG 01/20/2019   Tinnitus of right ear    Psoriasis    Arthritis    Osteopenia 01/05/2019      PCP: [name], PA   REFERRING PROVIDER: Melina Fiddler, MD   REFERRING DIAG: pain, radiculopathy   Rationale for Evaluation and Treatment Rehabilitation   THERAPY DIAG:  No diagnosis found.   ONSET DATE: December 2022   SUBJECTIVE:                                                                                                                                                                                            SUBJECTIVE STATEMENT: Pt reports that her Rt calf (where previous wound was) flared up after walk last week, so she took some days off.  Stairs on back of house are getting easier. She  was able to wash the car this weekend.    PERTINENT HISTORY:  Bil THA, R TKA, osteopenia   PAIN:  Are you having pain? no : NPRS scale: 0/10 Pain location: lower back  Pain description: ache  Aggravating factors: standing Relieving factors: suspended on noodle  PRECAUTIONS: None   WEIGHT BEARING RESTRICTIONS No   FALLS:  Has patient fallen in last 6 months? No   LIVING ENVIRONMENT: Lives with: lives with their spouse Lives in: House/apartment Stairs: Yes: External: 3 steps   OCCUPATION: Retired   PLOF: Independent   PATIENT GOALS   Improve balance and walking, be able to garden and do yard work     OBJECTIVE:    DIAGNOSTIC FINDINGS:   Left knee x-ray: 2022  EXAM: LEFT KNEE - COMPLETE 4+ VIEW   COMPARISON:  None.   FINDINGS: No evidence of fracture, dislocation, or joint effusion. There is tricompartmental moderate joint space narrowing and osteophytes. No focal bone lesion. Regional soft tissues are unremarkable.   IMPRESSION: 1. No acute findings in the left knee. 2. Moderate tricompartmental osteoarthritis   PATIENT SURVEYS:  FOTO 47% baseline 46 percent expected din 1 visit    SCREENING FOR RED FLAGS: Bowel or bladder incontinence: No Spinal tumors: No Cauda equina syndrome: No Compression fracture: No Abdominal aneurysm: No   COGNITION:           Overall cognitive status: Within functional limits for tasks assessed                          SENSATION: WFL   POSTURE: rounded shoulders; stands with forward trunk flexion    PALPATION:     LUMBAR ROM:    Active  AROM  eval AROM  9/1/3 AROM 9/29  Flexion No significant limitation    wfl  Extension 50% limited but no pain   25%  WFL  Right lateral rotation Pain 25% limited   No significant pain  25% without P!  Left lateral rotation  No pain      Right rotation      Left rotation       (Blank rows = not tested)   LOWER EXTREMITY ROM:      MMT Right eval Left eval Right  9/13  Left 9/13 Right/Left  Hip flexion 20.6 23.6 22.7 14 37.8 / 33.5  Hip extension         Hip abduction 41.9 39.5 37.5 29 30  / 29  Hip adduction         Hip internal rotation         Hip external rotation         Knee flexion         Knee extension 37.1 35.0 41.4 28.4 38.2 / 34.1   Ankle dorsiflexion 33.3 35.4     Ankle plantarflexion         Ankle inversion         Ankle eversion          (Blank rows = not tested)   LOWER EXTREMITY MMT:     Active motion  Right eval Left eval Right  Left  Right /Left 10/13/21  Hip flexion 90 pain full 90 painful  110 with tailbone pain  110 with knee pain  110/ 110 tailbone P!  Hip extension         Hip abduction         Hip adduction         Hip internal rotation         Hip external rotation         Knee flexion         Knee extension         Ankle dorsiflexion  Ankle plantarflexion         Ankle inversion         Ankle eversion          (Blank rows = not tested)   LUMBAR SPECIAL TESTS:  Straight leg raise test: Negative   FUNCTIONAL TESTS:  Berg Balance Scale: deferred to next visit      10/13/21 Berg: 38/56  BERG Balance Test          Date: 10/13/21  Sit to Stand 3  Standing unsupported 4  Sitting with back unsupported but feet supported 4  Stand to sit  3  Transfers  3  Standing unsupported with eyes closed 3  Standing unsupported feet together 4  From standing position, reach forward with outstretched arm 4  From standing position, pick up object from floor 4  From standing position, turn and look behind over each shoulder 1  Turn 360 2  Standing unsupported, alternately place foot on step 3  Standing unsupported, one foot in front 0  Standing on one leg 0  Total:  38     GAIT: Distance walked: flexed trunk  lateral movement noted       TODAY'S TREATMENT  Pt seen for aquatic therapy today.  Treatment took place in water 3.25-4.75ft depth at the [facility] pool. Temp of water was 92.  Pt  entered/exited the pool independently via stairs/step to pattern with 2 hand rails rail   * Straddling yellow noodle:  cycling  * Without support:   forward/ backward walking;  side stepping;  High knee marching forward/ backward * Without support: tandem stance;  SLS (improved!) * Lumbar stretch holding wall  * Staggered stance with kick board push/pull (kick board 1/2 submerged, core engaged) leading R/L x 10(difficult) * Stair climbing 6 step with reciprocal pattern forward/ backward with bilat rail * Thin square noodle pull downs x 10 x 2 * Lumbar rotation with hands on kick board, repeated with hands on shoulders (slightly painful) * Low back stretch holding wall  * SLS with shoulder add/abdct x 10 * With rainbow/yellow hand buoys at sides under water,  backward/ forward gait  * return to straddling yellow noodle:  cc ski and jumping   Pt requires the buoyancy and hydrostatic pressure of water for support, and to offload joints by unweighting joint load by at least 50 % in navel deep water and by at least 75-80% in chest to neck deep water.  Viscosity of the water is needed for resistance of strengthening. Water current perturbations provides challenge to standing balance requiring increased core activation.    PATIENT EDUCATION:  Education details: aquatic progressions  Person educated: Patient Education method: Explanation handout  Education comprehension: verbalized understanding     HOME EXERCISE PROGRAM: Access Code: [email or web address] URL: [email or web address] Date: 07/27/2021 Prepared by: Lorayne Bender   Exercises - Supine Quad Set  - 1 x daily - 7 x weekly - 3 sets - 10 reps - Active Straight Leg Raise with Quad Set  - 1 x daily - 7 x weekly - 3 sets - 10 reps - Supine Heel Slides  - 1 x daily - 7 x weekly - 3 sets - 10 reps   ASSESSMENT:   CLINICAL IMPRESSION: encouraged pt to call wound care nurse to discuss ongoing concerns with area she had the wound  earlier in year. Pt reporting overall improvement in functional mobility at home. Tolerated session well without increase in pain. Pt is progressing well towards  goals. Pt has 3 more approved visits; PT to assess goal next week and begin d/c planning.    OBJECTIVE IMPAIRMENTS decreased activity tolerance, difficulty walking, decreased ROM, decreased strength, and increased muscle spasms.    ACTIVITY LIMITATIONS lifting, bending, standing, and stairs   PARTICIPATION LIMITATIONS: community activity and yard work   PERSONAL FACTORS Age, Fitness, Past/current experiences, and Time since onset of injury/illness/exacerbation are also affecting patient's functional outcome.    REHAB POTENTIAL: Good   CLINICAL DECISION MAKING: Evolving/moderate complexity declining ability to stand   EVALUATION COMPLEXITY: Moderate     GOALS: Goals reviewed with patient? No   SHORT TERM GOALS: Target date: 10/25/2021   Patient will increase bilateral hip flexion to 105 degrees without pain  Baseline: Goal status: Achieved 110 bilateral    2.  Patient will increase gross bilateral leg strength by 5 lbs  Baseline: right stronger left weaker. Will continue to monitor  Goal status: Ongoing   3.  Patient will be independent with basic HEP on land and water  Baseline: has basic HEP Goal status:  Achieved    LONG TERM GOALS: Target date: 11/08/21   Patient will go up and down stairs to enter her house without pain  Baseline:  Goal status:Ongoing   2.  Patient will return to walking >1 mile without pain  Baseline:  Goal status:  has mild pain    3.  Patient will have a comprehensive stretching/ land and water strengthening  program  Baseline:  Goal status: Developing/ Ongoing         PLAN: PT FREQUENCY: 2x/week   PT DURATION: 8 weeks   PLANNED INTERVENTIONS: Therapeutic exercises, Therapeutic activity, Neuromuscular re-education, Balance training, Gait training, Patient/Family education, Self  Care, Joint mobilization, Aquatic Therapy, Dry Needling, Electrical stimulation, Cryotherapy, Moist heat, Ionotophoresis 4mg /ml Dexamethasone, and Manual therapy.   PLAN FOR NEXT SESSION: Continue progressive aquatic strengthening exercises.  , PTA 10/24/21 10:34 AM [facility] [address] [place], [place], [place] Phone: [phone]   Fax:  [phone]

## 2021-10-26 ENCOUNTER — Ambulatory Visit (HOSPITAL_BASED_OUTPATIENT_CLINIC_OR_DEPARTMENT_OTHER): Payer: Medicare Other | Admitting: Physical Therapy

## 2021-10-26 ENCOUNTER — Encounter (HOSPITAL_BASED_OUTPATIENT_CLINIC_OR_DEPARTMENT_OTHER): Payer: Self-pay | Admitting: Physical Therapy

## 2021-10-26 DIAGNOSIS — M5459 Other low back pain: Secondary | ICD-10-CM | POA: Diagnosis not present

## 2021-10-26 DIAGNOSIS — M5416 Radiculopathy, lumbar region: Secondary | ICD-10-CM

## 2021-10-26 DIAGNOSIS — R2689 Other abnormalities of gait and mobility: Secondary | ICD-10-CM

## 2021-10-26 NOTE — Therapy (Signed)
OUTPATIENT PHYSICAL THERAPY THORACOLUMBAR TREATMENT       Patient Name: [name] MRN: [id number] DOB:01/23/1945, 76 y.o., female Today's Date: 10/26/2021   PT End of Session - 10/26/21 0905     Visit Number 14    Number of Visits 16    Date for PT Re-Evaluation 11/08/21    Authorization Type progress note done on visit 7, due on 27    PT Start Time 0900    PT Stop Time 0941    PT Time Calculation (min) 41 min    Behavior During Therapy [facility] for tasks assessed/performed                  Past Medical History:  Diagnosis Date   Arthritis    Psoriasis    Intermittent; all over body and waxes and wanes   Tinnitus of right ear 2013   Intermittent; Melatonin has helped   Vaginal delivery 1973, 1981   Past Surgical History:  Procedure Laterality Date   goiter  1968   was on her Thyroid   IR KYPHO THORACIC WITH BONE BIOPSY  01/20/2019   JOINT REPLACEMENT     Knee replacement Right 2019   TOTAL HIP ARTHROPLASTY     Lt 2002, Rt 2005   VEIN LIGATION AND STRIPPING Right 2005   Patient Active Problem List   Diagnosis Date Noted   Hallux valgus, acquired 07/01/2019   Abnormal EKG 01/20/2019   Tinnitus of right ear    Psoriasis    Arthritis    Osteopenia 01/05/2019      PCP: Inda Coke, PA   REFERRING PROVIDER: Verner Chol, MD   REFERRING DIAG: pain, radiculopathy   Rationale for Evaluation and Treatment Rehabilitation   THERAPY DIAG:  No diagnosis found.   ONSET DATE: December 2022   SUBJECTIVE:                                                                                                                                                                                            SUBJECTIVE STATEMENT: Pt reports she didn't sleep well last night.  She has not checked out [employer] yet.     PERTINENT HISTORY:  Bil THA, R TKA, osteopenia   PAIN:  Are you having pain? yes : NPRS scale: 2/10 Pain location: lower back , bilat knees Pain  description: ache  Aggravating factors: standing Relieving factors: suspended on noodle     PRECAUTIONS: None   WEIGHT BEARING RESTRICTIONS No   FALLS:  Has patient fallen in last 6 months? No   LIVING ENVIRONMENT: Lives with: lives with their spouse Lives in:  House/apartment Stairs: Yes: External: 3 steps   OCCUPATION: Retired   PLOF: Independent   PATIENT GOALS   Improve balance and walking, be able to garden and do yard work     OBJECTIVE:    DIAGNOSTIC FINDINGS:   Left knee x-ray: 2022  EXAM: LEFT KNEE - COMPLETE 4+ VIEW   COMPARISON:  None.   FINDINGS: No evidence of fracture, dislocation, or joint effusion. There is tricompartmental moderate joint space narrowing and osteophytes. No focal bone lesion. Regional soft tissues are unremarkable.   IMPRESSION: 1. No acute findings in the left knee. 2. Moderate tricompartmental osteoarthritis   PATIENT SURVEYS:  FOTO 47% baseline 46 percent expected din 1 visit    SCREENING FOR RED FLAGS: Bowel or bladder incontinence: No Spinal tumors: No Cauda equina syndrome: No Compression fracture: No Abdominal aneurysm: No   COGNITION:           Overall cognitive status: Within functional limits for tasks assessed                          SENSATION: WFL   POSTURE: rounded shoulders; stands with forward trunk flexion    PALPATION:     LUMBAR ROM:    Active  AROM  eval AROM  9/1/3 AROM 9/29  Flexion No significant limitation    wfl  Extension 50% limited but no pain   25%  WFL  Right lateral rotation Pain 25% limited   No significant pain  25% without P!  Left lateral rotation  No pain      Right rotation      Left rotation       (Blank rows = not tested)   LOWER EXTREMITY ROM:      MMT Right eval Left eval Right  9/13 Left 9/13 Right/Left  Hip flexion 20.6 23.6 22.7 14 37.8 / 33.5  Hip extension         Hip abduction 41.9 39.5 37.5 29 30  / 29  Hip adduction         Hip internal rotation          Hip external rotation         Knee flexion         Knee extension 37.1 35.0 41.4 28.4 38.2 / 34.1   Ankle dorsiflexion 33.3 35.4     Ankle plantarflexion         Ankle inversion         Ankle eversion          (Blank rows = not tested)   LOWER EXTREMITY MMT:     Active motion  Right eval Left eval Right  Left  Right /Left 10/13/21  Hip flexion 90 pain full 90 painful  110 with tailbone pain  110 with knee pain  110/ 110 tailbone P!  Hip extension         Hip abduction         Hip adduction         Hip internal rotation         Hip external rotation         Knee flexion         Knee extension         Ankle dorsiflexion         Ankle plantarflexion         Ankle inversion         Ankle eversion          (  Blank rows = not tested)   LUMBAR SPECIAL TESTS:  Straight leg raise test: Negative   FUNCTIONAL TESTS:  Berg Balance Scale: deferred to next visit      10/13/21 Berg: 38/56  BERG Balance Test          Date: 10/13/21  Sit to Stand 3  Standing unsupported 4  Sitting with back unsupported but feet supported 4  Stand to sit  3  Transfers  3  Standing unsupported with eyes closed 3  Standing unsupported feet together 4  From standing position, reach forward with outstretched arm 4  From standing position, pick up object from floor 4  From standing position, turn and look behind over each shoulder 1  Turn 360 2  Standing unsupported, alternately place foot on step 3  Standing unsupported, one foot in front 0  Standing on one leg 0  Total:  38     GAIT: Distance walked: flexed trunk  lateral movement noted       TODAY'S TREATMENT  Pt seen for aquatic therapy today.  Treatment took place in water 3.25-4.75ft depth at the [facility] pool. Temp of water was 92.  Pt entered/exited the pool independently via stairs/step to pattern with 2 hand rails rail   * Without support:   forward/ backward walking;  side stepping; * Straddling yellow noodle:   cycling  * Without support: tandem stance (20s each);  SLS (15s RLE, 20s LLE) * Lumbar stretch holding wall  * plank on bench in water, then with hip ext * Lateral step ups x 10 each LE with single rail * low back stretch holding rails * Staggered stance with kick board push/pull (kick board 1/2 submerged, core engaged) leading R/L x 10 * trunk rotation with hands on top of floating kick board * Thin square noodle pull downs x 10 * return to straddling yellow noodle: cycling, cc ski and jumping jack legs  Pt requires the buoyancy and hydrostatic pressure of water for support, and to offload joints by unweighting joint load by at least 50 % in navel deep water and by at least 75-80% in chest to neck deep water.  Viscosity of the water is needed for resistance of strengthening. Water current perturbations provides challenge to standing balance requiring increased core activation.    PATIENT EDUCATION:  Education details: aquatic progressions  Person educated: Patient Education method: Explanation handout  Education comprehension: verbalized understanding     HOME EXERCISE PROGRAM: Access Code: [email or web address] URL: [email or web address] Date: 07/27/2021 Prepared by: Lorayne Bender   Exercises - Supine Quad Set  - 1 x daily - 7 x weekly - 3 sets - 10 reps - Active Straight Leg Raise with Quad Set  - 1 x daily - 7 x weekly - 3 sets - 10 reps - Supine Heel Slides  - 1 x daily - 7 x weekly - 3 sets - 10 reps   ASSESSMENT:   CLINICAL IMPRESSION:  Pt arrived with increased pain level in knees and back.  Pain worsened with stair climbing and noodle pull downs; reduced to incoming pain level with suspended exercises in deeper water.  Pt is progressing well towards remaining goals. Pt has 2 more approved visits; PT to assess goal next week and begin d/c planning.    OBJECTIVE IMPAIRMENTS decreased activity tolerance, difficulty walking, decreased ROM, decreased strength, and increased  muscle spasms.    ACTIVITY LIMITATIONS lifting, bending, standing, and stairs   PARTICIPATION LIMITATIONS: community activity and yard  work   PERSONAL FACTORS Age, Fitness, Past/current experiences, and Time since onset of injury/illness/exacerbation are also affecting patient's functional outcome.    REHAB POTENTIAL: Good   CLINICAL DECISION MAKING: Evolving/moderate complexity declining ability to stand   EVALUATION COMPLEXITY: Moderate     GOALS: Goals reviewed with patient? No   SHORT TERM GOALS: Target date: 10/25/2021   Patient will increase bilateral hip flexion to 105 degrees without pain  Baseline: Goal status: Achieved 110 bilateral    2.  Patient will increase gross bilateral leg strength by 5 lbs  Baseline: right stronger left weaker. Will continue to monitor  Goal status: Ongoing   3.  Patient will be independent with basic HEP on land and water  Baseline: has basic HEP Goal status:  Achieved    LONG TERM GOALS: Target date: 11/08/21   Patient will go up and down stairs to enter her house without pain  Baseline:  Goal status:Ongoing   2.  Patient will return to walking >1 mile without pain  Baseline:  Goal status:  has mild pain    3.  Patient will have a comprehensive stretching/ land and water strengthening  program  Baseline:  Goal status: Developing/ Ongoing         PLAN: PT FREQUENCY: 2x/week   PT DURATION: 8 weeks   PLANNED INTERVENTIONS: Therapeutic exercises, Therapeutic activity, Neuromuscular re-education, Balance training, Gait training, Patient/Family education, Self Care, Joint mobilization, Aquatic Therapy, Dry Needling, Electrical stimulation, Cryotherapy, Moist heat, Ionotophoresis 4mg /ml Dexamethasone, and Manual therapy.   PLAN FOR NEXT SESSION: Continue progressive aquatic strengthening exercises.  , PTA 10/26/21 11:19 AM [facility] [address] [place], [place], [place] Phone: [phone]   Fax:  [phone]

## 2021-10-30 NOTE — Therapy (Signed)
OUTPATIENT PHYSICAL THERAPY THORACOLUMBAR TREATMENT       Patient Name: [name] MRN: [id number] DOB:[birth year], 76 y.o., female Today's Date: 10/31/2021   PT End of Session - 10/31/21 1750     Visit Number 15    Number of Visits 16    Date for PT Re-Evaluation 11/08/21    Authorization Type progress note done on visit 7, due on 27    PT Start Time 1400    PT Stop Time 1445    PT Time Calculation (min) 45 min    Activity Tolerance Patient tolerated treatment well    Behavior During Therapy WFL for tasks assessed/performed                   Past Medical History:  Diagnosis Date   Arthritis    Psoriasis    Intermittent; all over body and waxes and wanes   Tinnitus of right ear 2013   Intermittent; Melatonin has helped   Vaginal delivery 1973, 1981   Past Surgical History:  Procedure Laterality Date   goiter  1968   was on her Thyroid   IR KYPHO THORACIC WITH BONE BIOPSY  01/20/2019   JOINT REPLACEMENT     Knee replacement Right 2019   TOTAL HIP ARTHROPLASTY     Lt 2002, Rt 2005   VEIN LIGATION AND STRIPPING Right 2005   Patient Active Problem List   Diagnosis Date Noted   Hallux valgus, acquired 07/01/2019   Abnormal EKG 01/20/2019   Tinnitus of right ear    Psoriasis    Arthritis    Osteopenia 01/05/2019      PCP: [name], PA   REFERRING PROVIDER: Melina Fiddler, MD   REFERRING DIAG: pain, radiculopathy   Rationale for Evaluation and Treatment Rehabilitation   THERAPY DIAG:  No diagnosis found.   ONSET DATE: December 2022   SUBJECTIVE:                                                                                                                                                                                            SUBJECTIVE STATEMENT: Pt informed of pending DC.  She reports shopping in 3 grocery stores ad raking leaves over w/e.  Increase in LBP by Monday, took 1 full day to recover    PERTINENT HISTORY:  Bil THA,  R TKA, osteopenia   PAIN:  Are you having pain? yes : NPRS scale: 4/10 Pain location: lower back , bilat knees Pain description: ache  Aggravating factors: standing Relieving factors: suspended on noodle     PRECAUTIONS: None   WEIGHT BEARING RESTRICTIONS No  FALLS:  Has patient fallen in last 6 months? No   LIVING ENVIRONMENT: Lives with: lives with their spouse Lives in: House/apartment Stairs: Yes: External: 3 steps   OCCUPATION: Retired   PLOF: Independent   PATIENT GOALS   Improve balance and walking, be able to garden and do yard work     OBJECTIVE:    DIAGNOSTIC FINDINGS:   Left knee x-ray: 2022  EXAM: LEFT KNEE - COMPLETE 4+ VIEW   COMPARISON:  None.   FINDINGS: No evidence of fracture, dislocation, or joint effusion. There is tricompartmental moderate joint space narrowing and osteophytes. No focal bone lesion. Regional soft tissues are unremarkable.   IMPRESSION: 1. No acute findings in the left knee. 2. Moderate tricompartmental osteoarthritis   PATIENT SURVEYS:  FOTO 47% baseline 46 percent expected din 1 visit    SCREENING FOR RED FLAGS: Bowel or bladder incontinence: No Spinal tumors: No Cauda equina syndrome: No Compression fracture: No Abdominal aneurysm: No   COGNITION:           Overall cognitive status: Within functional limits for tasks assessed                          SENSATION: WFL   POSTURE: rounded shoulders; stands with forward trunk flexion    PALPATION:     LUMBAR ROM:    Active  AROM  eval AROM  9/1/3 AROM 9/29  Flexion No significant limitation    wfl  Extension 50% limited but no pain   25%  WFL  Right lateral rotation Pain 25% limited   No significant pain  25% without P!  Left lateral rotation  No pain      Right rotation      Left rotation       (Blank rows = not tested)   LOWER EXTREMITY ROM:      MMT Right eval Left eval Right  9/13 Left 9/13 Right/Left  Hip flexion 20.6 23.6 22.7 14 37.8 /  33.5  Hip extension         Hip abduction 41.9 39.5 37.5 29 30  / 29  Hip adduction         Hip internal rotation         Hip external rotation         Knee flexion         Knee extension 37.1 35.0 41.4 28.4 38.2 / 34.1   Ankle dorsiflexion 33.3 35.4     Ankle plantarflexion         Ankle inversion         Ankle eversion          (Blank rows = not tested)   LOWER EXTREMITY MMT:     Active motion  Right eval Left eval Right  Left  Right /Left 10/13/21  Hip flexion 90 pain full 90 painful  110 with tailbone pain  110 with knee pain  110/ 110 tailbone P!  Hip extension         Hip abduction         Hip adduction         Hip internal rotation         Hip external rotation         Knee flexion         Knee extension         Ankle dorsiflexion         Ankle plantarflexion  Ankle inversion         Ankle eversion          (Blank rows = not tested)   LUMBAR SPECIAL TESTS:  Straight leg raise test: Negative   FUNCTIONAL TESTS:  Berg Balance Scale: deferred to next visit      10/13/21 Berg: 38/56  BERG Balance Test          Date: 10/13/21  Sit to Stand 3  Standing unsupported 4  Sitting with back unsupported but feet supported 4  Stand to sit  3  Transfers  3  Standing unsupported with eyes closed 3  Standing unsupported feet together 4  From standing position, reach forward with outstretched arm 4  From standing position, pick up object from floor 4  From standing position, turn and look behind over each shoulder 1  Turn 360 2  Standing unsupported, alternately place foot on step 3  Standing unsupported, one foot in front 0  Standing on one leg 0  Total:  38     GAIT: Distance walked: flexed trunk  lateral movement noted       TODAY'S TREATMENT  Pt seen for aquatic therapy today.  Treatment took place in water 3.25-4.75ft depth at the [facility] pool. Temp of water was 92.  Pt entered/exited the pool independently via stairs/step to pattern with  2 hand rails rail   * Without support:   forward/ backward walking;  side stepping; * Straddling yellow noodle:  cycling  * Without support: tandem stance;  SLS R/L * Lumbar stretch holding wall    -hip hiking R/L * plank on bench in water, then with hip ext x10 * Lateral step ups x 10 each LE with single rail * Staggered stance with kick board push/pull (kick board 1/2 submerged, core engaged) leading R/L x 10 * trunk rotation with hands on top of floating kick board * kick board pushdowns abd isometric ex 7x10s hold * return to straddling yellow noodle: cycling, cc ski   Pt requires the buoyancy and hydrostatic pressure of water for support, and to offload joints by unweighting joint load by at least 50 % in navel deep water and by at least 75-80% in chest to neck deep water.  Viscosity of the water is needed for resistance of strengthening. Water current perturbations provides challenge to standing balance requiring increased core activation.    PATIENT EDUCATION:  Education details: aquatic progressions  Person educated: Patient Education method: Explanation handout  Education comprehension: verbalized understanding     HOME EXERCISE PROGRAM: Access Code: [email or web address] URL: [email or web address] Date: 07/27/2021 Prepared by: Carolyne Littles   Exercises - Supine Quad Set  - 1 x daily - 7 x weekly - 3 sets - 10 reps - Active Straight Leg Raise with Quad Set  - 1 x daily - 7 x weekly - 3 sets - 10 reps - Supine Heel Slides  - 1 x daily - 7 x weekly - 3 sets - 10 reps   ASSESSMENT:   CLINICAL IMPRESSION: Pt directed through exercises today with little cuing for execution.  She has made progress with toleration to activity as she had a very active W/e shopping and raking leaves.  She did eventually have an increase in LBP but verbalizes good management of.  She has not yet checked out the [facility] as we have encouraged but she is aware of pending DC.  She has declined land  based return as she feels it increases her pain.  Plan to have HEP laminated and ready to issue next visit with DC    OBJECTIVE IMPAIRMENTS decreased activity tolerance, difficulty walking, decreased ROM, decreased strength, and increased muscle spasms.    ACTIVITY LIMITATIONS lifting, bending, standing, and stairs   PARTICIPATION LIMITATIONS: community activity and yard work   PERSONAL FACTORS Age, Fitness, Past/current experiences, and Time since onset of injury/illness/exacerbation are also affecting patient's functional outcome.    REHAB POTENTIAL: Good   CLINICAL DECISION MAKING: Evolving/moderate complexity declining ability to stand   EVALUATION COMPLEXITY: Moderate     GOALS: Goals reviewed with patient? No   SHORT TERM GOALS: Target date: 10/25/2021   Patient will increase bilateral hip flexion to 105 degrees without pain  Baseline: Goal status: Achieved 110 bilateral    2.  Patient will increase gross bilateral leg strength by 5 lbs  Baseline: right stronger left weaker. Will continue to monitor  Goal status: Ongoing   3.  Patient will be independent with basic HEP on land and water  Baseline: has basic HEP Goal status:  Achieved    LONG TERM GOALS: Target date: 11/08/21   Patient will go up and down stairs to enter her house without pain  Baseline:  Goal status:Ongoing   2.  Patient will return to walking >1 mile without pain  Baseline:  Goal status:  has mild pain    3.  Patient will have a comprehensive stretching/ land and water strengthening  program  Baseline:  Goal status: Developing/ Ongoing         PLAN: PT FREQUENCY: 2x/week   PT DURATION: 8 weeks   PLANNED INTERVENTIONS: Therapeutic exercises, Therapeutic activity, Neuromuscular re-education, Balance training, Gait training, Patient/Family education, Self Care, Joint mobilization, Aquatic Therapy, Dry Needling, Electrical stimulation, Cryotherapy, Moist heat, Ionotophoresis 4mg /ml  Dexamethasone, and Manual therapy.   PLAN FOR NEXT SESSION: DC  ) Mykenzie Ebanks MPT 10/31/21 5:58 PM [facility] [address] [place], [place], [place] Phone: [phone]   Fax:  [phone]

## 2021-10-31 ENCOUNTER — Ambulatory Visit (HOSPITAL_BASED_OUTPATIENT_CLINIC_OR_DEPARTMENT_OTHER): Payer: Medicare Other | Admitting: Physical Therapy

## 2021-10-31 ENCOUNTER — Encounter (HOSPITAL_BASED_OUTPATIENT_CLINIC_OR_DEPARTMENT_OTHER): Payer: Self-pay | Admitting: Physical Therapy

## 2021-10-31 DIAGNOSIS — M5416 Radiculopathy, lumbar region: Secondary | ICD-10-CM

## 2021-10-31 DIAGNOSIS — M5459 Other low back pain: Secondary | ICD-10-CM

## 2021-10-31 DIAGNOSIS — R2689 Other abnormalities of gait and mobility: Secondary | ICD-10-CM

## 2021-11-01 NOTE — Therapy (Signed)
OUTPATIENT PHYSICAL THERAPY THORACOLUMBAR TREATMENT       Patient Name: [name] MRN: [id number] DOB:03-Jun-1945, 76 y.o., female Today's Date: 11/02/2021   PT End of Session - 11/02/21 1017     Visit Number 16    Number of Visits 16    Date for PT Re-Evaluation 11/08/21    Authorization Type progress note done on visit 7, due on 27    PT Start Time 0947    PT Stop Time 1030    PT Time Calculation (min) 43 min    Activity Tolerance Patient tolerated treatment well    Behavior During Therapy WFL for tasks assessed/performed           PHYSICAL THERAPY DISCHARGE SUMMARY  Visits from Start of Care: 16  Current functional level related to goals / functional outcomes: Pt is indep with ADL's and functional mobility   Remaining deficits: pain   Education / Equipment: Management of condition; HEP   Patient agrees to discharge. Patient goals were partially met. Patient is being discharged due to maximized rehab potential.           Past Medical History:  Diagnosis Date   Arthritis    Psoriasis    Intermittent; all over body and waxes and wanes   Tinnitus of right ear 2013   Intermittent; Melatonin has helped   Vaginal delivery 1973, 1981   Past Surgical History:  Procedure Laterality Date   goiter  1968   was on her Thyroid   IR KYPHO THORACIC WITH BONE BIOPSY  01/20/2019   JOINT REPLACEMENT     Knee replacement Right 2019   TOTAL HIP ARTHROPLASTY     Lt 2002, Rt 2005   VEIN LIGATION AND STRIPPING Right 2005   Patient Active Problem List   Diagnosis Date Noted   Hallux valgus, acquired 07/01/2019   Abnormal EKG 01/20/2019   Tinnitus of right ear    Psoriasis    Arthritis    Osteopenia 01/05/2019      PCP: Inda Coke, PA   REFERRING PROVIDER: Verner Chol, MD   REFERRING DIAG: pain, radiculopathy   Rationale for Evaluation and Treatment Rehabilitation   THERAPY DIAG:  No diagnosis found.   ONSET DATE: December 2022    SUBJECTIVE:                                                                                                                                                                                            SUBJECTIVE STATEMENT: Pt reports everything is about the same   PERTINENT HISTORY:  Bil THA, R TKA, osteopenia   PAIN:  Are you having pain? yes : NPRS scale: 4/10 Pain location: lower back , bilat knees Pain description: ache  Aggravating factors: standing Relieving factors: suspended on noodle     PRECAUTIONS: None   WEIGHT BEARING RESTRICTIONS No   FALLS:  Has patient fallen in last 6 months? No   LIVING ENVIRONMENT: Lives with: lives with their spouse Lives in: House/apartment Stairs: Yes: External: 3 steps   OCCUPATION: Retired   PLOF: Independent   PATIENT GOALS   Improve balance and walking, be able to garden and do yard work     OBJECTIVE:    DIAGNOSTIC FINDINGS:   Left knee x-ray: 2022  EXAM: LEFT KNEE - COMPLETE 4+ VIEW   COMPARISON:  None.   FINDINGS: No evidence of fracture, dislocation, or joint effusion. There is tricompartmental moderate joint space narrowing and osteophytes. No focal bone lesion. Regional soft tissues are unremarkable.   IMPRESSION: 1. No acute findings in the left knee. 2. Moderate tricompartmental osteoarthritis   PATIENT SURVEYS:  FOTO 47% baseline 46 percent expected din 1 visit    SCREENING FOR RED FLAGS: Bowel or bladder incontinence: No Spinal tumors: No Cauda equina syndrome: No Compression fracture: No Abdominal aneurysm: No   COGNITION:           Overall cognitive status: Within functional limits for tasks assessed                          SENSATION: WFL   POSTURE: rounded shoulders; stands with forward trunk flexion    PALPATION:     LUMBAR ROM:    Active  AROM  eval AROM  9/1/3 AROM 9/29 AROM 11/02/21  Flexion No significant limitation    wfl   Extension 50% limited but no pain   25%  WFL    Right lateral rotation Pain 25% limited   No significant pain  25% without P! Wfl No pain  Left lateral rotation  No pain       Right rotation       Left rotation        (Blank rows = not tested)   LOWER EXTREMITY ROM:      MMT Right eval Left eval Right  9/13 Left 9/13 Right/Left Right/Left  Hip flexion 20.6 23.6 22.7 14 37.8 / 33.5   Hip extension          Hip abduction 41.9 39.5 37.'5 29 30 ' / 29 30.4/30.2  Hip adduction          Hip internal rotation          Hip external rotation          Knee flexion          Knee extension 37.1 35.0 41.4 28.4 38.2 / 34.1  38.5/ 40.1  Ankle dorsiflexion 33.3 35.4      Ankle plantarflexion          Ankle inversion          Ankle eversion           (Blank rows = not tested)   LOWER EXTREMITY MMT:     Active motion  Right eval Left eval Right  Left  Right /Left 10/13/21  Hip flexion 90 pain full 90 painful  110 with tailbone pain  110 with knee pain  110/ 110 tailbone P!  Hip extension         Hip abduction  Hip adduction         Hip internal rotation         Hip external rotation         Knee flexion         Knee extension         Ankle dorsiflexion         Ankle plantarflexion         Ankle inversion         Ankle eversion          (Blank rows = not tested)   LUMBAR SPECIAL TESTS:  Straight leg raise test: Negative   FUNCTIONAL TESTS:  Berg Balance Scale: deferred to next visit      10/13/21 Berg: 38/56  BERG Balance Test          Date: 10/13/21  Sit to Stand 3  Standing unsupported 4  Sitting with back unsupported but feet supported 4  Stand to sit  3  Transfers  3  Standing unsupported with eyes closed 3  Standing unsupported feet together 4  From standing position, reach forward with outstretched arm 4  From standing position, pick up object from floor 4  From standing position, turn and look behind over each shoulder 1  Turn 360 2  Standing unsupported, alternately place foot on step 3  Standing  unsupported, one foot in front 0  Standing on one leg 0  Total:  38     GAIT: Distance walked: flexed trunk  lateral movement noted       TODAY'S TREATMENT  Pt seen for aquatic therapy today.  Treatment took place in water 3.25-4.75ft depth at the [facility] pool. Temp of water was 92.  Pt entered/exited the pool independently via stairs/step to pattern with 2 hand rails rail   Pt Issued laminated aquatic HEP which is referenced and modified as needed throughout session  * Without support:   forward/ backward walking;  side stepping; * Straddling yellow noodle:  cycling  * Without support: tandem stance;  SLS R/L * Lumbar stretch holding wall    -hip hiking R/L * plank on bench in water, then with hip ext x10 * Lateral step ups x 10 each LE with single rail * Staggered stance with kick board push/pull (kick board 1/2 submerged, core engaged) leading R/L x 10 * trunk rotation with hands on top of floating kick board * kick board pushdowns abd isometric ex 10 x10s hold  *Tandem stance with ue add/abd using 1 foam hand buoy.  Pt requires the buoyancy and hydrostatic pressure of water for support, and to offload joints by unweighting joint load by at least 50 % in navel deep water and by at least 75-80% in chest to neck deep water.  Viscosity of the water is needed for resistance of strengthening. Water current perturbations provides challenge to standing balance requiring increased core activation.    PATIENT EDUCATION:  Education details: aquatic progressions  Person educated: Patient Education method: Explanation handout  Education comprehension: verbalized understanding     HOME EXERCISE PROGRAM: Access Code: [email or web address] URL: [email or web address] Date: 07/27/2021 Prepared by: Carolyne Littles   Exercises - Supine Quad Set  - 1 x daily - 7 x weekly - 3 sets - 10 reps - Active Straight Leg Raise with Quad Set  - 1 x daily - 7 x weekly - 3 sets - 10  reps - Supine Heel Slides  - 1 x daily - 7 x weekly - 3  sets - 10 reps   ASSESSMENT:   CLINICAL IMPRESSION: Pt completes issued aquatic Hep today with vc for clarity and indep with program.  Objective testing completed and goals addressed.  Most goals met. Strength goal is marked met although her hip abd strength numbers to appear to have improved although I do believe it is due to inter-tester reliability with hand held dynamometer. And she is able to walk up to 1 mile but continues to report mild pain She has had a decrease in pain with overall improvement in strength and function.    OBJECTIVE IMPAIRMENTS decreased activity tolerance, difficulty walking, decreased ROM, decreased strength, and increased muscle spasms.    ACTIVITY LIMITATIONS lifting, bending, standing, and stairs   PARTICIPATION LIMITATIONS: community activity and yard work   PERSONAL FACTORS Age, Fitness, Past/current experiences, and Time since onset of injury/illness/exacerbation are also affecting patient's functional outcome.    REHAB POTENTIAL: Good   CLINICAL DECISION MAKING: Evolving/moderate complexity declining ability to stand   EVALUATION COMPLEXITY: Moderate     GOALS: Goals reviewed with patient? No   SHORT TERM GOALS: Target date: 10/25/2021   Patient will increase bilateral hip flexion to 105 degrees without pain  Baseline: Goal status: Achieved 110 bilateral    2.  Patient will increase gross bilateral leg strength by 5 lbs  Baseline: right stronger left weaker. Will continue to monitor  Goal status: Achieved   3.  Patient will be independent with basic HEP on land and water  Baseline: has basic HEP Goal status:  Achieved    LONG TERM GOALS: Target date: 11/08/21   Patient will go up and down stairs to enter her house without pain  Baseline:  Goal status:Achieved  2.  Patient will return to walking >1 mile without pain  Baseline:  Goal status:  has mild pain / Not met   3.   Patient will have a comprehensive stretching/ land and water strengthening  program  Baseline:  Goal status: Achieved         PLAN: PT FREQUENCY: 2x/week   PT DURATION: 8 weeks   PLANNED INTERVENTIONS: Therapeutic exercises, Therapeutic activity, Neuromuscular re-education, Balance training, Gait training, Patient/Family education, Self Care, Joint mobilization, Aquatic Therapy, Dry Needling, Electrical stimulation, Cryotherapy, Moist heat, Ionotophoresis 71m/ml Dexamethasone, and Manual therapy.   PLAN FOR NEXT SESSION: DC  MAnnamarie Major Kayleigh Broadwell MPT 11/02/21 1:08 PM C[facility]Re[facility] [address] [address]Phone: [phone]  Fax:  [phone]

## 2021-11-02 ENCOUNTER — Encounter (HOSPITAL_BASED_OUTPATIENT_CLINIC_OR_DEPARTMENT_OTHER): Payer: Self-pay | Admitting: Physical Therapy

## 2021-11-02 ENCOUNTER — Ambulatory Visit (HOSPITAL_BASED_OUTPATIENT_CLINIC_OR_DEPARTMENT_OTHER): Payer: Medicare Other | Admitting: Physical Therapy

## 2021-11-02 DIAGNOSIS — M5416 Radiculopathy, lumbar region: Secondary | ICD-10-CM

## 2021-11-02 DIAGNOSIS — R2689 Other abnormalities of gait and mobility: Secondary | ICD-10-CM

## 2021-11-02 DIAGNOSIS — M5459 Other low back pain: Secondary | ICD-10-CM

## 2021-11-06 ENCOUNTER — Ambulatory Visit (HOSPITAL_BASED_OUTPATIENT_CLINIC_OR_DEPARTMENT_OTHER): Payer: Medicare Other | Admitting: Physical Therapy

## 2021-11-08 ENCOUNTER — Ambulatory Visit (HOSPITAL_BASED_OUTPATIENT_CLINIC_OR_DEPARTMENT_OTHER): Payer: Medicare Other | Admitting: Physical Therapy

## 2021-11-16 ENCOUNTER — Other Ambulatory Visit: Payer: Self-pay | Admitting: Medical

## 2021-12-28 ENCOUNTER — Encounter: Payer: Self-pay | Admitting: *Deleted

## 2022-01-29 ENCOUNTER — Encounter: Payer: Self-pay | Admitting: Physician Assistant

## 2022-01-29 ENCOUNTER — Ambulatory Visit (INDEPENDENT_AMBULATORY_CARE_PROVIDER_SITE_OTHER): Payer: Medicare Other | Admitting: Physician Assistant

## 2022-01-29 VITALS — BP 126/80 | HR 53 | Temp 97.3°F | Ht 68.0 in | Wt 204.2 lb

## 2022-01-29 DIAGNOSIS — M2550 Pain in unspecified joint: Secondary | ICD-10-CM

## 2022-01-29 DIAGNOSIS — F329 Major depressive disorder, single episode, unspecified: Secondary | ICD-10-CM | POA: Diagnosis not present

## 2022-01-29 DIAGNOSIS — M79672 Pain in left foot: Secondary | ICD-10-CM

## 2022-01-29 DIAGNOSIS — M79671 Pain in right foot: Secondary | ICD-10-CM | POA: Diagnosis not present

## 2022-01-29 LAB — URIC ACID: Uric Acid, Serum: 5.4 mg/dL (ref 2.4–7.0)

## 2022-01-29 LAB — SEDIMENTATION RATE: Sed Rate: 28 mm/hr (ref 0–30)

## 2022-01-29 LAB — C-REACTIVE PROTEIN: CRP: <1 mg/dL (ref 0.5–20.0)

## 2022-01-29 MED ORDER — MELOXICAM 7.5 MG PO TABS: 7.5000 mg | ORAL_TABLET | Freq: Every day | ORAL | 0 refills | Status: DC

## 2022-01-29 NOTE — Patient Instructions (Signed)
It was great to see you!  Start 7.5 mg daily meloxicam for your pain -- this will replace any ibuprofen/aleve/naproxen that you are taking  We will do blood work today  Consider cymbalta -- I think that this could potentially help your mood and pain -- message me after we get your results back  Take care,  Inda Coke PA-C

## 2022-01-29 NOTE — Progress Notes (Signed)
[name] is a 77 y.o. female here for a follow up of a pre-existing problem.  History of Present Illness:   Chief Complaint  Patient presents with   Arthritis    Pt is having joint pain all the time. She is not sure if from Psoriasis.   Foot Problem    Pt c/o toes get red, numbness and burning sometimes and burning across top of foot, pt not sure if having Gout flares or neuropathy.    HPI  Arthritis She reports diffuse joint pain constantly. She takes Tylenol Arthritis in the morning and Tylenol PM at bedtime daily. She takes Aleve a couple of times a week when her pain is severe.   She is unsure if she has psoriatic arthritis. Her sister has a Hx of psoriasis and suspects that her father had this as well. She tried Cerave cream with some relief of dry, itchy skin but it has never completely resolved.  She established care with dermatology [facility]. She states that she was told recently that she does not have psoriasis. However she reports that she has been affected by psoriasis diffusely for most of her life. She was started on 2 different creams for week days and weekends, she has been able to use one of the creams during the week without relief. However, the weekend cream was $300 so she did not start this.   She has a Hx of multiple kyphoplasty surgeries. Patient has seen Dr. Raliegh Ip, ortho, in the past. She has a h/o of osteopenia. She was started on Evenity by Dr. Layne Benton.  She wonders if her symptoms are side effects of Evenity.  Patient tried aquatherapy in the past but developed shoulder pain toward the end of her PT.  She reports that she had positive screening tests for Lupus in the past but was always told that this was a false positive on multiple occasions.    Foot problem She reports burning pain and numbness intermittently across the top of her bilateral feet R>L. Accompanying intermittent swelling, redness across all of her toes. She suspects  that this may be gout vs neuropathy. She has been evaluated by podiatry, last in 2021, but not for her current foot symptoms.   Depression She has a PHQ-9 of 9 today. She feels that her mood is impacted by her chronic pain. Denies SI/HI  Past Medical History:  Diagnosis Date   Arthritis    Psoriasis    Intermittent; all over body and waxes and wanes   Tinnitus of right ear 2013   Intermittent; Melatonin has helped   Vaginal delivery 1973, 1981     Social History   Tobacco Use   Smoking status: Never   Smokeless tobacco: Never  Vaping Use   Vaping Use: Never used  Substance Use Topics   Alcohol use: Never   Drug use: Never    Past Surgical History:  Procedure Laterality Date   goiter  1968   was on her Thyroid   IR KYPHO THORACIC WITH BONE BIOPSY  01/20/2019   JOINT REPLACEMENT     Knee replacement Right 2019   TOTAL HIP ARTHROPLASTY     Lt 2002, Rt 2005   VEIN LIGATION AND STRIPPING Right 2005    Family History  Problem Relation Age of Onset   Dementia Mother    Heart disease Father        CABG x 5   Psoriasis Father    Psoriasis Sister  Osteoarthritis Sister    Pneumonia Maternal Grandfather    Heart attack Paternal Grandmother     No Known Allergies  Current Medications:   Current Outpatient Medications:    acetaminophen (TYLENOL) 650 MG CR tablet, Take 650 mg by mouth every 8 (eight) hours as needed for pain., Disp: , Rfl:    aspirin EC 81 MG tablet, Take 81 mg by mouth daily., Disp: , Rfl:    dorzolamide-timolol (COSOPT) 22.3-6.8 MG/ML ophthalmic solution, 1 drop 2 (two) times daily., Disp: , Rfl:    fluticasone (FLONASE) 50 MCG/ACT nasal spray, Place 2 sprays into both nostrils daily., Disp: 16 g, Rfl: 1   ibuprofen (ADVIL) 200 MG tablet, Take 600 mg by mouth every 6 (six) hours as needed., Disp: , Rfl:    levocetirizine (XYZAL) 5 MG tablet, TAKE 1 TABLET BY MOUTH EVERY DAY IN THE EVENING, Disp: 90 tablet, Rfl: 1   Melatonin 10 MG SUBL, Place 2  tablets under the tongue at bedtime., Disp: , Rfl:    meloxicam (MOBIC) 7.5 MG tablet, Take 1 tablet (7.5 mg total) by mouth daily., Disp: 30 tablet, Rfl: 0   naproxen sodium (ALEVE) 220 MG tablet, Take 220 mg by mouth daily as needed., Disp: , Rfl:    Romosozumab-aqqg (EVENITY) 105 MG/1.17ML SOSY injection, Inject 210 mg into the skin once., Disp: , Rfl:    SYSTANE ULTRA 0.4-0.3 % SOLN, , Disp: , Rfl:    tiZANidine (ZANAFLEX) 4 MG tablet, Take 4 mg by mouth at bedtime., Disp: , Rfl:    triamcinolone cream (KENALOG) 0.1 %, Apply 1 application topically 2 (two) times daily., Disp: , Rfl:    Review of Systems:   Review of Systems  Constitutional:  Negative for fever.  HENT:  Negative for congestion, sinus pain and sore throat.   Respiratory:  Negative for cough, shortness of breath and wheezing.   Cardiovascular:  Negative for chest pain and palpitations.  Gastrointestinal:  Negative for blood in stool, constipation, diarrhea, nausea and vomiting.  Genitourinary:  Negative for dysuria, frequency and hematuria.  Musculoskeletal:  Positive for joint pain and myalgias.  Skin:  Positive for rash (psoriatic dry, itchy patches diffusely).  Neurological:  Positive for sensory change.  Psychiatric/Behavioral:  Positive for depression.     Vitals:   Vitals:   01/29/22 1116  BP: 126/80  Pulse: (!) 53  Temp: (!) 97.3 F (36.3 C)  TempSrc: Temporal  SpO2: 98%  Weight: 204 lb 4 oz (92.6 kg)  Height: 5\' 8"  (1.727 m)     Body mass index is 31.06 kg/m.  Physical Exam:   Physical Exam Vitals and nursing note reviewed.  Constitutional:      General: She is not in acute distress.    Appearance: She is well-developed. She is not ill-appearing or toxic-appearing.  Cardiovascular:     Rate and Rhythm: Normal rate and regular rhythm.     Pulses: Normal pulses.          Dorsalis pedis pulses are 2+ on the right side and 2+ on the left side.       Posterior tibial pulses are 2+ on the right  side and 2+ on the left side.     Heart sounds: Normal heart sounds, S1 normal and S2 normal.     Comments: Adequate capillary refill in b/l toes Pulmonary:     Effort: Pulmonary effort is normal.     Breath sounds: Normal breath sounds.  Musculoskeletal:     Comments:  Bilateral feet without significant TTP, erythema  Skin:    General: Skin is warm and dry.  Neurological:     Mental Status: She is alert.     GCS: GCS eye subscore is 4. GCS verbal subscore is 5. GCS motor subscore is 6.  Psychiatric:        Speech: Speech normal.        Behavior: Behavior normal. Behavior is cooperative.     Assessment and Plan:   Arthralgia, unspecified joint Ongoing Will check auto-immune panel for further evaluation to determine if this is possibly related to diagnosis other then OA Recommend trial 7.5 mg mobic daily to see if this helps, may continue to take tylenol if needed I also recommended that she consider Cymbalta -- she will look into this Follow-up based on results  Foot pain, bilateral No red flags Asymptomatic today Continue to monitor Consider sports med or podiatry referral if persists  Reactive depression Uncontrolled Denies SI/HI Consider cymbalta and/or talk therapy - she will look into this and let me know I discussed with patient that if they develop any SI, to tell someone immediately and seek medical attention.  I,Alexis Herring,acting as a Neurosurgeon for [employer], PA.,have documented all relevant documentation on the behalf of [name], PA,as directed by  [name], PA while in the presence of [name], [place].  I, [name], [place], have reviewed all documentation for this visit. The documentation on 01/29/22 for the exam, diagnosis, procedures, and orders are all accurate and complete.   [name], PA-C

## 2022-01-31 ENCOUNTER — Telehealth: Payer: Self-pay | Admitting: Physician Assistant

## 2022-01-31 LAB — ANTI-NUCLEAR AB-TITER (ANA TITER): ANA Titer 1: 1:320 {titer} — ABNORMAL HIGH

## 2022-01-31 LAB — ANA: Anti Nuclear Antibody (ANA): POSITIVE — AB

## 2022-01-31 LAB — CYCLIC CITRUL PEPTIDE ANTIBODY, IGG/IGA: Cyclic Citrullin Peptide Ab: 7 units (ref 0–19)

## 2022-01-31 LAB — RHEUMATOID FACTOR: Rheumatoid fact SerPl-aCnc: <14 IU/mL (ref <14)

## 2022-01-31 NOTE — Telephone Encounter (Signed)
Noted  

## 2022-01-31 NOTE — Telephone Encounter (Signed)
Patient called in for results of lab work from 01/29/22. Informed patient that once labs have been resulted by PCP, she will receive a call. Pt verbalized understanding.

## 2022-02-01 ENCOUNTER — Other Ambulatory Visit: Payer: Self-pay | Admitting: Physician Assistant

## 2022-02-01 DIAGNOSIS — M2550 Pain in unspecified joint: Secondary | ICD-10-CM

## 2022-02-01 DIAGNOSIS — R768 Other specified abnormal immunological findings in serum: Secondary | ICD-10-CM

## 2022-02-22 NOTE — Progress Notes (Signed)
Office Visit Note  Patient: [name]             Date of Birth: 1945/05/17           MRN: [id number]             PCP: Inda Coke, PA Referring: Inda Coke, PA Visit Date: 02/23/2022   Subjective:  New Patient (Initial Visit) (Patient states she thought she had gout. Patient states she was diagnosed false positive with lupus years ago.)   History of Present Illness: [name] is a 77 y.o. female here for evaluation of joint pain in multiple areas with positive ANA and patient has history of psoriasis. Apparently recent dermatology evaluation not as certain about psoriasis as her diagnosis but has been present chronically waxing and waning in the past. 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 Raliegh Ip. She has been treated with evenity for osteopenia. She has longstanding history of psoriasis typically controlled with topical medications and has never been on long-term systemic medication just occasional short-term prednisone for exacerbation.  She moved from [place] about 3 years ago to be closer to family after retiring.  She experienced major flareup of skin disease especially bad areas on her scalp and lower extremities and completed a course of topical steroids did not afford the other prescribed medicine. She has neuropathy affecting distal foot and toes but with increased redness and pain in that area. She was concerned for possible gout due to acute exacerbation but uric acid level was 5.4.  She has longstanding history of abnormal labs raising concern for lupus but has never been diagnosed this goes back several decades.  Labs reviewed ANA 1:320 DFS RF neg CCP neg ESR 28 Uric acid 5.4  Activities of Daily Living:  Patient reports morning stiffness for 0 minute.   Patient Reports nocturnal pain.  Difficulty dressing/grooming: Reports Difficulty climbing stairs: Reports Difficulty getting out  of chair: Reports Difficulty using hands for taps, buttons, cutlery, and/or writing: Reports  Review of Systems  Constitutional:  Positive for fatigue.  HENT:  Positive for mouth dryness. Negative for mouth sores.   Eyes:  Positive for dryness.  Respiratory:  Negative for shortness of breath.   Cardiovascular:  Positive for palpitations. Negative for chest pain.  Gastrointestinal:  Negative for blood in stool, constipation and diarrhea.  Endocrine: Negative for increased urination.  Genitourinary:  Negative for involuntary urination.  Musculoskeletal:  Positive for joint pain, gait problem, joint pain, joint swelling, myalgias, muscle weakness, muscle tenderness and myalgias. Negative for morning stiffness (0).  Skin:  Positive for rash, hair loss and sensitivity to sunlight. Negative for color change.  Allergic/Immunologic: Negative for susceptible to infections.  Neurological:  Negative for dizziness and headaches.  Hematological:  Negative for swollen glands.  Psychiatric/Behavioral:  Positive for sleep disturbance. Negative for depressed mood. The patient is not nervous/anxious.     PMFS History:  Patient Active Problem List   Diagnosis Date Noted   Bilateral hand pain 02/23/2022   Positive ANA (antinuclear antibody) 02/23/2022   Hallux valgus, acquired 07/01/2019   Abnormal EKG 01/20/2019   Tinnitus of right ear    Psoriasis    Arthritis    Osteopenia 01/05/2019    Past Medical History:  Diagnosis Date   Arthritis    Psoriasis    Intermittent; all over body and waxes and wanes   Tinnitus of right ear 2013   Intermittent; Melatonin has helped  Vaginal delivery 1973, 1981    Family History  Problem Relation Age of Onset   Dementia Mother    Heart disease Father        CABG x 5   Psoriasis Father    Psoriasis Sister    Osteoarthritis Sister    Pneumonia Maternal Grandfather    Heart attack Paternal Grandmother    Past Surgical History:  Procedure Laterality  Date   goiter  1968   was on her Thyroid   IR KYPHO THORACIC WITH BONE BIOPSY  01/20/2019   JOINT REPLACEMENT     Knee replacement Right 2019   TOTAL HIP ARTHROPLASTY     Lt 2002, Rt 2005   VEIN LIGATION AND STRIPPING Right 2005   Social History   Social History Narrative   Was a Pharmacist, 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     Pharynx: Oropharynx is clear.  Eyes:     Conjunctiva/sclera: Conjunctivae normal.  Cardiovascular:     Rate and Rhythm: Normal rate and regular rhythm.  Pulmonary:     Effort: Pulmonary effort is normal.     Breath sounds: Normal breath sounds.  Skin:    General: Skin is warm and dry.     Findings: Rash present.     Comments: Multiple small flat erythematous patches on shins b/l no scale Trace pitting pedal edema Mild nail dystrophy on 3rd toenails b/l  Neurological:     Mental Status: She is alert.  Psychiatric:        Mood and Affect: Mood normal.      Musculoskeletal Exam:  Neck ROM slightly restricted lateral rotation tenderness on left side paraspinal muscles Right shoulder tenderness to pressure, pain reaching behind low back, no swelling Fingers full ROM 1st CMC joint squaring b/l, 5th DIP bony nodule and flexion deformity Left knee crepitus, medial joint line tenderness, decreased flexion and extension, no swelling Ankles full ROM no tenderness or swelling Lateral midfoot bony nodule present no overlying callus or ulcer 1st  MTP joints lateral deviation both sides, well healed surgical scar, 4th and 5th toes shortened   Investigation: No additional findings.  Imaging: XR Hand 2 View Right  Result Date: 02/23/2022 X-ray right hand 2 views Radiocarpal joint appears normal.  They be some flattening of ulnar styloid process.  Moderate first CMC joint degenerative changes with slight subluxation.  His second MCP joint space narrowing slightly increased sclerosis no significant osteophyte.  Second and third digit interphalangeal joints with early lateral osteophyte and second DIP small periarticular calcification.  Fifth DIP joint with superior joint space loss with cystic and possible erosive changes. Impression Osteoarthritis most advanced at first [facility] joint and fifth DIP there are possible erosive changes but no other abnormal mineralization or periosteal reaction  XR Hand 2 View Left  Result Date: 02/23/2022 X-ray left hand 2 views Radiocarpal joint with mild degenerative  changes.  There are calcifications present in the ulnocarpal space.  Advanced first [facility] joint degenerative changes with moderate subluxation.  Para-articular calcification at first IP joint.  MCP joints appear normal.  PIP joint lateral osteophytes and second PIP peritubular calcification.  DIP joint space narrowing most advanced fifth DIP with lateral osteophytes with cystic and possible erosive changes. Impression Advanced osteoarthritis worst at the first [facility] joint and fifth DIP   Recent Labs: Lab Results  Component Value Date   WBC 6.6 01/17/2021   HGB 14.0 01/17/2021   PLT 330 01/17/2021   NA 138 01/17/2021   K 3.7 01/17/2021   CL 104 01/17/2021   CO2 25 01/17/2021   GLUCOSE 122 (H) 01/17/2021   BUN 14 01/17/2021   CREATININE 0.75 01/17/2021   CALCIUM 9.0 01/17/2021   GFRAA >60 01/20/2019    Speciality Comments: No specialty comments available.  Procedures:  No procedures performed Allergies: Patient has no known allergies.    Assessment / Plan:     Visit Diagnoses: Bilateral hand pain - Plan: XR Hand 2 View Right, XR Hand 2 View Left  Hands demonstrate considerable Heberden's nodes in distal distribution and first [address] joint squaring.  X-rays appear consistent with osteoarthritis of these involved areas.  Checking other serum markers for inflammation but initial impression more likely osteoarthritis with possible contribution of peripheral neuropathy as primary issue.  Positive ANA (antinuclear antibody) - Plan: Sedimentation rate, C-reactive protein, RNP Antibody, Anti-Smith antibody, Sjogrens syndrome-A extractable nuclear antibody, Sjogrens syndrome-B extractable nuclear antibody, Anti-DNA antibody, double-stranded, C3 and C4  Positive ANA at moderate titer but she has longstanding history of what sounds like positive ANA without definite diagnosis.  Will check specific antibody panel as above also checking serum complement and serum inflammatory markers with sed rate and CRP.  Psoriasis  Skin disease activity does not appear to severe at present although does have active rashes.  I do not see any changes definite for dactylitis or characteristic nail pitting.  Currently on the triamcinolone 0.1% topical for affected areas.  Orders: Orders Placed This Encounter  Procedures   XR Hand 2 View Right   XR Hand 2 View Left   Sedimentation rate   C-reactive protein   RNP Antibody   Anti-Smith antibody   Sjogrens syndrome-A extractable nuclear antibody   Sjogrens syndrome-B extractable nuclear antibody   Anti-DNA antibody, double-stranded   C3 and C4   No orders of the defined types were placed in this encounter.    Follow-Up Instructions: Return in about 3 weeks (around 03/16/2022) for New pt OA/?ANA f/u 3wks.   Collier Salina, MD  Note - This record has been created using [employer].  Chart creation errors have been sought, but may not always  have been located. Such creation errors do not  reflect on  the standard of medical care.

## 2022-02-23 ENCOUNTER — Ambulatory Visit: Payer: Medicare Other | Attending: Internal Medicine | Admitting: Internal Medicine

## 2022-02-23 ENCOUNTER — Encounter: Payer: Self-pay | Admitting: Internal Medicine

## 2022-02-23 ENCOUNTER — Ambulatory Visit: Payer: Medicare Other

## 2022-02-23 ENCOUNTER — Ambulatory Visit (INDEPENDENT_AMBULATORY_CARE_PROVIDER_SITE_OTHER): Payer: Medicare Other

## 2022-02-23 VITALS — BP 122/79 | HR 77 | Resp 16 | Ht 67.0 in | Wt 200.0 lb

## 2022-02-23 DIAGNOSIS — M79641 Pain in right hand: Secondary | ICD-10-CM

## 2022-02-23 DIAGNOSIS — R768 Other specified abnormal immunological findings in serum: Secondary | ICD-10-CM | POA: Diagnosis present

## 2022-02-23 DIAGNOSIS — M79642 Pain in left hand: Secondary | ICD-10-CM

## 2022-02-23 DIAGNOSIS — L409 Psoriasis, unspecified: Secondary | ICD-10-CM | POA: Insufficient documentation

## 2022-02-24 LAB — C-REACTIVE PROTEIN: CRP: 12.3 mg/L — ABNORMAL HIGH (ref <8.0)

## 2022-02-24 LAB — ANTI-DNA ANTIBODY, DOUBLE-STRANDED: ds DNA Ab: 1 IU/mL

## 2022-02-24 LAB — SEDIMENTATION RATE: Sed Rate: 14 mm/h (ref 0–30)

## 2022-02-24 LAB — SJOGRENS SYNDROME-B EXTRACTABLE NUCLEAR ANTIBODY: SSB (La) (ENA) Antibody, IgG: <1 AI

## 2022-02-24 LAB — SJOGRENS SYNDROME-A EXTRACTABLE NUCLEAR ANTIBODY: SSA (Ro) (ENA) Antibody, IgG: <1 AI

## 2022-02-24 LAB — C3 AND C4
C3 Complement: 162 mg/dL (ref 83–193)
C4 Complement: 51 mg/dL (ref 15–57)

## 2022-02-24 LAB — ANTI-SMITH ANTIBODY: ENA SM Ab Ser-aCnc: <1 AI

## 2022-02-24 LAB — RNP ANTIBODY: Ribonucleic Protein(ENA) Antibody, IgG: <1 AI

## 2022-03-22 ENCOUNTER — Ambulatory Visit (INDEPENDENT_AMBULATORY_CARE_PROVIDER_SITE_OTHER): Payer: Medicare Other

## 2022-03-22 VITALS — Wt 200.0 lb

## 2022-03-22 DIAGNOSIS — Z0000 Encounter for general adult medical examination without abnormal findings: Secondary | ICD-10-CM | POA: Diagnosis not present

## 2022-03-22 NOTE — Progress Notes (Signed)
I connected with  Natasha Bence on 03/22/22 by a audio enabled telemedicine application and verified that I am speaking with the correct person using two identifiers.  Patient Location: Home  Provider Location: Office/Clinic  I discussed the limitations of evaluation and management by telemedicine. The patient expressed understanding and agreed to proceed.   Subjective:   [name] is a 77 y.o. female who presents for Medicare Annual (Subsequent) preventive examination.  Review of Systems     Cardiac Risk Factors include: advanced age (>44mn, >>41women)     Objective:    Today's Vitals   03/22/22 0933  Weight: 200 lb (90.7 kg)   Body mass index is 31.32 kg/m.     03/22/2022    9:37 AM 07/27/2021    8:51 AM 03/07/2021    9:36 AM 01/17/2021    1:00 AM 12/31/2020    4:08 PM 04/09/2019   11:58 AM 01/20/2019   10:33 AM  Advanced Directives  Does Patient Have a Medical Advance Directive? Yes Yes Yes No Yes Yes Yes  Type of AParamed[place]of [place]Heart [place] will Living will [place] will  Living will;Healthcare Power of Attorney Living will;Healthcare Power of [place] will  Does patient want to make changes to medical advance directive?      No - Patient declined No - Patient declined  Copy of HB[place]oniain Chart? No - copy requested  No - copy requested   No - copy requested No - copy requested  Would patient like information on creating a medical advance directive?  No - Patient declined         Current Medications (verified) Outpatient Encounter Medications as of 03/22/2022  Medication Sig   acetaminophen (TYLENOL) 650 MG CR tablet Take 650 mg by mouth every 8 (eight) hours as needed for pain.   dorzolamide-timolol (COSOPT) 22.3-6.8 MG/ML ophthalmic solution 1 drop 2 (two) times daily.   levocetirizine (XYZAL) 5 MG tablet TAKE 1 TABLET BY MOUTH EVERY DAY IN THE EVENING    Romosozumab-aqqg (EVENITY) 105 MG/1.17ML SOSY injection Inject 210 mg into the skin once.   SYSTANE ULTRA 0.4-0.3 % SOLN    triamcinolone cream (KENALOG) 0.1 % Apply 1 application topically 2 (two) times daily.   [DISCONTINUED] aspirin EC 81 MG tablet Take 81 mg by mouth daily. (Patient not taking: Reported on 02/23/2022)   [DISCONTINUED] fluticasone (FLONASE) 50 MCG/ACT nasal spray Place 2 sprays into both nostrils daily.   [DISCONTINUED] ibuprofen (ADVIL) 200 MG tablet Take 600 mg by mouth every 6 (six) hours as needed.   [DISCONTINUED] Melatonin 10 MG SUBL Place 2 tablets under the tongue at bedtime.   [DISCONTINUED] meloxicam (MOBIC) 7.5 MG tablet Take 1 tablet (7.5 mg total) by mouth daily.   [DISCONTINUED] naproxen sodium (ALEVE) 220 MG tablet Take 220 mg by mouth daily as needed.   [DISCONTINUED] tiZANidine (ZANAFLEX) 4 MG tablet Take 4 mg by mouth at bedtime. (Patient not taking: Reported on 02/23/2022)   No facility-administered encounter medications on file as of 03/22/2022.    Allergies (verified) Patient has no known allergies.   History: Past Medical History:  Diagnosis Date   Arthritis    Psoriasis    Intermittent; all over body and waxes and wanes   Tinnitus of right ear 2013   Intermittent; Melatonin has helped   Vaginal delivery 1973, 1981   Past Surgical History:  Procedure Laterality Date   goiter  1968  was on her Thyroid   IR KYPHO THORACIC WITH BONE BIOPSY  01/20/2019   JOINT REPLACEMENT     Knee replacement Right 2019   TOTAL HIP ARTHROPLASTY     Lt 2002, Rt 2005   VEIN LIGATION AND STRIPPING Right 2005   Family History  Problem Relation Age of Onset   Dementia Mother    Heart disease Father        CABG x 5   Psoriasis Father    Psoriasis Sister    Osteoarthritis Sister    Pneumonia Maternal Grandfather    Heart attack Paternal Grandmother    Social History   Socioeconomic History   Marital status: Married    Spouse name: Not on file   Number of  children: Not on file   Years of education: Not on file   Highest education level: Not on file  Occupational History   Not on file  Tobacco Use   Smoking status: Never    Passive exposure: Never   Smokeless tobacco: Never  Vaping Use   Vaping Use: Never used  Substance and Sexual Activity   Alcohol use: Never   Drug use: Never   Sexual activity: Not Currently  Other Topics Concern   Not on file  Social History Narrative   Was a Pharmacist, 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   Moved to area from [place], [place] to be closer to family    Social Determinants of Health   Financial Resource Strain: [place]  (03/22/2022)   Overall Financial Resource Strain (CARDIA)    Difficulty of Paying Living Expenses: Not hard at all  Food Insecurity: No Food Insecurity (03/22/2022)   Hunger Vital Sign    Worried About Running Out of Food in the Last Year: Never true    [place] in the Last Year: Never true  Transportation Needs: No Transportation Needs (03/22/2022)   PRAPARE - Hydrologist (Medical): No    Lack of Transportation (Non-Medical): No  Physical Activity: Inactive (03/22/2022)   Exercise Vital Sign    Days of Exercise per Week: 0 days    Minutes of Exercise per Session: 0 min  Stress: No Stress Concern Present (03/22/2022)   [place]    Feeling of Stress : Not at all  Social Connections: [place] (03/22/2022)   Social Connection and Isolation Panel [NHANES]    Frequency of Communication with Friends and Family: More than three times a week    Frequency of Social Gatherings with Friends and Family: More than three times a week    Attends Religious Services: More than 4 times per year    Active Member of [employer] or Organizations: Yes    Attends Archivist Meetings: 1 to 4 times per year    Marital Status: Married    Tobacco  Counseling Counseling given: Not Answered   Clinical Intake:  Pre-visit preparation completed: Yes  Pain : No/denies pain     BMI - recorded: 31.32 Nutritional Status: BMI > 30  Obese Nutritional Risks: None Diabetes: No  How often do you need to have someone help you when you read instructions, pamphlets, or other written materials from your doctor or pharmacy?: 1 - Never  Diabetic?no  Interpreter Needed?: No  Information entered by :: Charlott Rakes, LPN   Activities of Daily Living    03/22/2022    9:39 AM  In  your present state of health, do you have any difficulty performing the following activities:  Hearing? 0  Vision? 0  Difficulty concentrating or making decisions? 0  Walking or climbing stairs? 0  Dressing or bathing? 0  Doing errands, shopping? 0  Preparing Food and eating ? N  Using the Toilet? N  In the past six months, have you accidently leaked urine? N  Do you have problems with loss of bowel control? N  Managing your Medications? N  Managing your Finances? N  Housekeeping or managing your Housekeeping? N    Patient Care Team: Inda Coke, [place] as PCP - General (Physician Assistant) Sherlynn Stalls, MD as Consulting Physician (Ophthalmology) Paulla Dolly Tamala Fothergill, DPM as Consulting Physician (Podiatry)  Indicate any recent Medical Services you may have received from other than [facility] providers in the past year (date may be approximate).     Assessment:   This is a routine wellness examination for [place].  Hearing/Vision screen Hearing Screening - Comments:: Pt stated HOH right ear with tinnitus  Vision Screening - Comments:: Pt follows up with Dr Sherlynn Stalls  for annual eye exams   Dietary issues and exercise activities discussed: Current Exercise Habits: The patient does not participate in regular exercise at present   Goals Addressed             This Visit's Progress    Patient Stated       None at this time        Depression  Screen    03/22/2022    9:36 AM 01/29/2022   11:44 AM 03/07/2021    9:35 AM 04/09/2019   11:59 AM 01/20/2019    8:27 AM  PHQ 2/9 Scores  PHQ - 2 Score 0 1 0 0 0  PHQ- 9 Score 0 9       Fall Risk    03/22/2022    9:38 AM 03/07/2021    9:37 AM 04/09/2019   11:58 AM  Fall Risk   Falls in the past year? 0 1 0  Number falls in past yr: 0 1 0  Injury with Fall? 0 1 0  Comment  bruised   Risk for fall due to : Impaired vision;Impaired balance/gait Impaired vision Orthopedic patient  Follow up Falls prevention discussed Falls prevention discussed Education provided;Falls prevention discussed;Falls evaluation completed    FALL RISK PREVENTION PERTAINING TO THE HOME:  Any stairs in or around the home? Yes  If so, are there any without handrails? No  Home free of loose throw rugs in walkways, pet beds, electrical cords, etc? Yes  Adequate lighting in your home to reduce risk of falls? Yes   ASSISTIVE DEVICES UTILIZED TO PREVENT FALLS:  Life alert? No  Use of a cane, walker or w/c? No  Grab bars in the bathroom? Yes  Shower chair or bench in shower? No  Elevated toilet seat or a handicapped toilet? No   TIMED UP AND GO:  Was the test performed? No .   Cognitive Function:        03/22/2022    9:40 AM 03/07/2021    9:41 AM 04/09/2019   11:59 AM  6CIT Screen  What Year? 0 points 0 points 0 points  What month? 0 points 0 points 0 points  What time? 0 points 0 points 0 points  Count back from 20 0 points 0 points 0 points  Months in reverse 0 points 0 points 0 points  Repeat phrase 0 points 4 points  0 points  Total Score 0 points 4 points 0 points    Immunizations Immunization History  Administered Date(s) Administered   Fluad Quad(high Dose 65+) 10/28/2019   Influenza, High Dose Seasonal PF 11/29/2021    TDAP status: Due, Education has been provided regarding the importance of this vaccine. Advised may receive this vaccine at local pharmacy or Health Dept. Aware to provide a  copy of the vaccination record if obtained from local pharmacy or Health Dept. Verbalized acceptance and understanding.  Flu Vaccine status: Up to date  Pneumococcal vaccine status: Due, Education has been provided regarding the importance of this vaccine. Advised may receive this vaccine at local pharmacy or Health Dept. Aware to provide a copy of the vaccination record if obtained from local pharmacy or Health Dept. Verbalized acceptance and understanding.  Covid-19 vaccine status: Declined, Education has been provided regarding the importance of this vaccine but patient still declined. Advised may receive this vaccine at local pharmacy or Health Dept.or vaccine clinic. Aware to provide a copy of the vaccination record if obtained from local pharmacy or Health Dept. Verbalized acceptance and understanding.  Qualifies for Shingles Vaccine? Yes   Zostavax completed No   Shingrix Completed?: No.    Education has been provided regarding the importance of this vaccine. Patient has been advised to call insurance company to determine out of pocket expense if they have not yet received this vaccine. Advised may also receive vaccine at local pharmacy or Health Dept. Verbalized acceptance and understanding.  Screening Tests Health Maintenance  Topic Date Due   COVID-19 Vaccine (1) Never done   Hepatitis C Screening  Never done   DTaP/Tdap/Td (1 - Tdap) Never done   Zoster Vaccines- Shingrix (1 of 2) Never done   Pneumonia Vaccine 31+ Years old (1 of 1 - PCV) Never done   Medicare Annual Wellness (AWV)  03/22/2023   INFLUENZA VACCINE  Completed   DEXA SCAN  Completed   HPV VACCINES  Aged Out    Health Maintenance  Health Maintenance Due  Topic Date Due   COVID-19 Vaccine (1) Never done   Hepatitis C Screening  Never done   DTaP/Tdap/Td (1 - Tdap) Never done   Zoster Vaccines- Shingrix (1 of 2) Never done   Pneumonia Vaccine 10+ Years old (1 of 1 - PCV) Never done    Colorectal cancer  screening: No longer required.   Mammogram status: No longer required due to per pt .  Bone Density status: Completed 01/02/19. Results reflect: Bone density results: OSTEOPENIA. Repeat every 2 years.   Additional Screening:  Hepatitis C Screening: does qualify;   Vision Screening: Recommended annual ophthalmology exams for early detection of glaucoma and other disorders of the eye. Is the patient up to date with their annual eye exam?  Yes  Who is the provider or what is the name of the office in which the patient attends annual eye exams? Dr Sherlynn Stalls  If pt is not established with a provider, would they like to be referred to a provider to establish care? No .   Dental Screening: Recommended annual dental exams for proper oral hygiene  Community Resource Referral / Chronic Care Management: CRR required this visit?  No   CCM required this visit?  No      Plan:     I have personally reviewed and noted the following in the patient's chart:   Medical and social history Use of alcohol, tobacco or illicit drugs  Current medications  and supplements including opioid prescriptions. Patient is not currently taking opioid prescriptions. Functional ability and status Nutritional status Physical activity Advanced directives List of other physicians Hospitalizations, surgeries, and ER visits in previous 12 months Vitals Screenings to include cognitive, depression, and falls Referrals and appointments  In addition, I have reviewed and discussed with patient certain preventive protocols, quality metrics, and best practice recommendations. A written personalized care plan for preventive services as well as general preventive health recommendations were provided to patient.     Willette Brace, LPN   [id number]   Nurse Notes: none

## 2022-03-22 NOTE — Patient Instructions (Signed)
[name] , Thank you for taking time to come for your Medicare Wellness Visit. I appreciate your ongoing commitment to your health goals. Please review the following plan we discussed and let me know if I can assist you in the future.   These are the goals we discussed:  Goals      Patient Stated     None at this time     Patient Stated     None at this time         This is a list of the screening recommended for you and due dates:  Health Maintenance  Topic Date Due   COVID-19 Vaccine (1) Never done   Hepatitis C Screening: USPSTF Recommendation to screen - Ages 104-79 yo.  Never done   DTaP/Tdap/Td vaccine (1 - Tdap) Never done   Zoster (Shingles) Vaccine (1 of 2) Never done   Pneumonia Vaccine (1 of 1 - PCV) Never done   Medicare Annual Wellness Visit  03/22/2023   Flu Shot  Completed   DEXA scan (bone density measurement)  Completed   HPV Vaccine  Aged Out    Advanced directives: Please bring a copy of your health care power of attorney and living will to the office at your convenience.  Conditions/risks identified: none at this time   Next appointment: Follow up in one year for your annual wellness visit    Preventive Care 65 Years and Older, Female Preventive care refers to lifestyle choices and visits with your health care provider that can promote health and wellness. What does preventive care include? A yearly physical exam. This is also called an annual well check. Dental exams once or twice a year. Routine eye exams. Ask your health care provider how often you should have your eyes checked. Personal lifestyle choices, including: Daily care of your teeth and gums. Regular physical activity. Eating a healthy diet. Avoiding tobacco and drug use. Limiting alcohol use. Practicing safe sex. Taking low-dose aspirin every day. Taking vitamin and mineral supplements as recommended by your health care provider. What happens during an annual well check? The  services and screenings done by your health care provider during your annual well check will depend on your age, overall health, lifestyle risk factors, and family history of disease. Counseling  Your health care provider may ask you questions about your: Alcohol use. Tobacco use. Drug use. Emotional well-being. Home and relationship well-being. Sexual activity. Eating habits. History of falls. Memory and ability to understand (cognition). Work and work Statistician. Reproductive health. Screening  You may have the following tests or measurements: Height, weight, and BMI. Blood pressure. Lipid and cholesterol levels. These may be checked every 5 years, or more frequently if you are over 61 years old. Skin check. Lung cancer screening. You may have this screening every year starting at age 40 if you have a 30-pack-year history of smoking and currently smoke or have quit within the past 15 years. Fecal occult blood test (FOBT) of the stool. You may have this test every year starting at age 47. Flexible sigmoidoscopy or colonoscopy. You may have a sigmoidoscopy every 5 years or a colonoscopy every 10 years starting at age 14. Hepatitis C blood test. Hepatitis B blood test. Sexually transmitted disease (STD) testing. Diabetes screening. This is done by checking your blood sugar (glucose) after you have not eaten for a while (fasting). You may have this done every 1-3 years. Bone density scan. This is done to screen for osteoporosis. You  may have this done starting at age 43. Mammogram. This may be done every 1-2 years. Talk to your health care provider about how often you should have regular mammograms. Talk with your health care provider about your test results, treatment options, and if necessary, the need for more tests. Vaccines  Your health care provider may recommend certain vaccines, such as: Influenza vaccine. This is recommended every year. Tetanus, diphtheria, and acellular  pertussis (Tdap, Td) vaccine. You may need a Td booster every 10 years. Zoster vaccine. You may need this after age 48. Pneumococcal 13-valent conjugate (PCV13) vaccine. One dose is recommended after age 50. Pneumococcal polysaccharide (PPSV23) vaccine. One dose is recommended after age 49. Talk to your health care provider about which screenings and vaccines you need and how often you need them. This information is not intended to replace advice given to you by your health care provider. Make sure you discuss any questions you have with your health care provider. Document Released: 01/28/2015 Document Revised: 09/21/2015 Document Reviewed: 11/02/2014 Elsevier Interactive Patient Education  2017 [employer] Prevention in the Home Falls can cause injuries. They can happen to people of all ages. There are many things you can do to make your home safe and to help prevent falls. What can I do on the outside of my home? Regularly fix the edges of walkways and driveways and fix any cracks. Remove anything that might make you trip as you walk through a door, such as a raised step or threshold. Trim any bushes or trees on the path to your home. Use bright outdoor lighting. Clear any walking paths of anything that might make someone trip, such as rocks or tools. Regularly check to see if handrails are loose or broken. Make sure that both sides of any steps have handrails. Any raised decks and porches should have guardrails on the edges. Have any leaves, snow, or ice cleared regularly. Use sand or salt on walking paths during winter. Clean up any spills in your garage right away. This includes oil or grease spills. What can I do in the bathroom? Use night lights. Install grab bars by the toilet and in the tub and shower. Do not use towel bars as grab bars. Use non-skid mats or decals in the tub or shower. If you need to sit down in the shower, use a plastic, non-slip stool. Keep the floor  dry. Clean up any water that spills on the floor as soon as it happens. Remove soap buildup in the tub or shower regularly. Attach bath mats securely with double-sided non-slip rug tape. Do not have throw rugs and other things on the floor that can make you trip. What can I do in the bedroom? Use night lights. Make sure that you have a light by your bed that is easy to reach. Do not use any sheets or blankets that are too big for your bed. They should not hang down onto the floor. Have a firm chair that has side arms. You can use this for support while you get dressed. Do not have throw rugs and other things on the floor that can make you trip. What can I do in the kitchen? Clean up any spills right away. Avoid walking on wet floors. Keep items that you use a lot in easy-to-reach places. If you need to reach something above you, use a strong step stool that has a grab bar. Keep electrical cords out of the way. Do not use floor  polish or wax that makes floors slippery. If you must use wax, use non-skid floor wax. Do not have throw rugs and other things on the floor that can make you trip. What can I do with my stairs? Do not leave any items on the stairs. Make sure that there are handrails on both sides of the stairs and use them. Fix handrails that are broken or loose. Make sure that handrails are as long as the stairways. Check any carpeting to make sure that it is firmly attached to the stairs. Fix any carpet that is loose or worn. Avoid having throw rugs at the top or bottom of the stairs. If you do have throw rugs, attach them to the floor with carpet tape. Make sure that you have a light switch at the top of the stairs and the bottom of the stairs. If you do not have them, ask someone to add them for you. What else can I do to help prevent falls? Wear shoes that: Do not have high heels. Have rubber bottoms. Are comfortable and fit you well. Are closed at the toe. Do not wear  sandals. If you use a stepladder: Make sure that it is fully opened. Do not climb a closed stepladder. Make sure that both sides of the stepladder are locked into place. Ask someone to hold it for you, if possible. Clearly mark and make sure that you can see: Any grab bars or handrails. First and last steps. Where the edge of each step is. Use tools that help you move around (mobility aids) if they are needed. These include: Canes. Walkers. Scooters. Crutches. Turn on the lights when you go into a dark area. Replace any light bulbs as soon as they burn out. Set up your furniture so you have a clear path. Avoid moving your furniture around. If any of your floors are uneven, fix them. If there are any pets around you, be aware of where they are. Review your medicines with your doctor. Some medicines can make you feel dizzy. This can increase your chance of falling. Ask your doctor what other things that you can do to help prevent falls. This information is not intended to replace advice given to you by your health care provider. Make sure you discuss any questions you have with your health care provider. Document Released: 10/28/2008 Document Revised: 06/09/2015 Document Reviewed: 02/05/2014 Elsevier Interactive Patient Education  2017 [employer].

## 2022-03-28 ENCOUNTER — Ambulatory Visit: Payer: Medicare Other | Admitting: Internal Medicine

## 2022-04-06 ENCOUNTER — Encounter: Payer: Self-pay | Admitting: Internal Medicine

## 2022-04-06 ENCOUNTER — Ambulatory Visit: Payer: Medicare Other | Attending: Internal Medicine | Admitting: Internal Medicine

## 2022-04-06 VITALS — BP 121/78 | HR 73 | Resp 16 | Ht 68.0 in | Wt 202.0 lb

## 2022-04-06 DIAGNOSIS — R768 Other specified abnormal immunological findings in serum: Secondary | ICD-10-CM

## 2022-04-06 DIAGNOSIS — M159 Polyosteoarthritis, unspecified: Secondary | ICD-10-CM | POA: Diagnosis present

## 2022-04-06 DIAGNOSIS — M25511 Pain in right shoulder: Secondary | ICD-10-CM | POA: Diagnosis present

## 2022-04-06 DIAGNOSIS — M79641 Pain in right hand: Secondary | ICD-10-CM | POA: Insufficient documentation

## 2022-04-06 DIAGNOSIS — L409 Psoriasis, unspecified: Secondary | ICD-10-CM

## 2022-04-06 DIAGNOSIS — G8929 Other chronic pain: Secondary | ICD-10-CM

## 2022-04-06 DIAGNOSIS — M79642 Pain in left hand: Secondary | ICD-10-CM

## 2022-04-06 MED ORDER — CLOBETASOL PROPIONATE 0.05 % EX SOLN: 1.0000 | Freq: Two times a day (BID) | CUTANEOUS | 0 refills | Status: AC | PRN

## 2022-04-06 MED ORDER — MELOXICAM 7.5 MG PO TABS: 7.5000 mg | ORAL_TABLET | Freq: Every day | ORAL | 3 refills | Status: DC | PRN

## 2022-04-06 NOTE — Progress Notes (Signed)
Office Visit Note  Patient: [name]             Date of Birth: 01-15-1946           MRN: [id number]             PCP: Inda Coke, PA Referring: Inda Coke, PA Visit Date: 04/06/2022   Subjective:  Follow-up (Doing good )   History of Present Illness: [name] is a 77 y.o. female here for follow up for joint pain and skin rashes with positive ANA.  Lab results from initial visit were negative for specific antibody markers there was a mildly elevated C-reactive protein of 12.3.  X-ray of bilateral hands was consistent with osteoarthritis with no inflammatory disease changes.  Since her last visit she continues having similar symptoms with joint pains particularly notices issues in her neck and back and shoulders as well as both hands and both feet.  She has not had as many episodes of toe redness swelling and painful sensation but still occur sometimes.  She has been using the topical triamcinolone on skin rashes which is helpful but does not use this very regularly because it causes some skin burning and irritation.  Previous HPI 02/23/22 [name] is a 77 y.o. female here for evaluation of joint pain in multiple areas with positive ANA and patient has history of psoriasis. Apparently recent dermatology evaluation not as certain about psoriasis as her diagnosis but has been present chronically waxing and waning in the past. 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 Raliegh Ip. She has been treated with evenity for osteopenia. She has longstanding history of psoriasis typically controlled with topical medications and has never been on long-term systemic medication just occasional short-term prednisone for exacerbation.  She moved from [place] about 3 years ago to be closer to family after retiring.  She experienced major flareup of skin disease especially bad areas on her scalp and lower extremities and  completed a course of topical steroids did not afford the other prescribed medicine. She has neuropathy affecting distal foot and toes but with increased redness and pain in that area. She was concerned for possible gout due to acute exacerbation but uric acid level was 5.4.  She has longstanding history of abnormal labs raising concern for lupus but has never been diagnosed this goes back several decades.   Labs reviewed ANA 1:320 DFS RF neg CCP neg ESR 28 Uric acid 5.4   Review of Systems  Constitutional:  Negative for fatigue.  HENT:  Positive for mouth sores and mouth dryness.   Eyes:  Positive for dryness.  Respiratory:  Negative for shortness of breath.   Cardiovascular:  Negative for chest pain and palpitations.  Gastrointestinal:  Negative for blood in stool, constipation and diarrhea.  Endocrine: Negative for increased urination.  Genitourinary:  Negative for involuntary urination.  Musculoskeletal:  Positive for joint pain, gait problem, joint pain, joint swelling, myalgias, muscle weakness, morning stiffness, muscle tenderness and myalgias.  Skin:  Positive for hair loss and sensitivity to sunlight. Negative for color change and rash.  Allergic/Immunologic: Negative for susceptible to infections.  Neurological:  Negative for dizziness and headaches.  Hematological:  Negative for swollen glands.  Psychiatric/Behavioral:  Positive for sleep disturbance. Negative for depressed mood. The patient is nervous/anxious.     PMFS History:  Patient Active Problem List   Diagnosis Date Noted   Pain in right shoulder 04/06/2022   Bilateral  hand pain 02/23/2022   Positive ANA (antinuclear antibody) 02/23/2022   Hallux valgus, acquired 07/01/2019   Abnormal EKG 01/20/2019   Tinnitus of right ear    Psoriasis    Generalized osteoarthritis of multiple sites    Osteopenia 01/05/2019    Past Medical History:  Diagnosis Date   Arthritis    Psoriasis    Intermittent; all over body  and waxes and wanes   Tinnitus of right ear 2013   Intermittent; Melatonin has helped   Vaginal delivery 1973, 1981    Family History  Problem Relation Age of Onset   Dementia Mother    Heart disease Father        CABG x 5   Psoriasis Father    Psoriasis Sister    Osteoarthritis Sister    Pneumonia Maternal Grandfather    Heart attack Paternal Grandmother    Past Surgical History:  Procedure Laterality Date   goiter  1968   was on her Thyroid   IR KYPHO THORACIC WITH BONE BIOPSY  01/20/2019   JOINT REPLACEMENT     Knee replacement Right 2019   TOTAL HIP ARTHROPLASTY     Lt 2002, Rt 2005   VEIN LIGATION AND STRIPPING Right 2005   Social History   Social History Narrative   Was a Pharmacist, 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  Pulmonary:     Effort: Pulmonary effort is normal.     Breath sounds: Normal breath sounds.  Skin:    General: Skin is warm and dry.     Findings: Rash present.  Neurological:     Mental Status: She is alert.      Musculoskeletal Exam:  Neck rotation slightly restricted tenderness along paraspinal muscles and on left side of neck Shoulders full ROM right shoulder tenderness to pressure no palpable swelling Elbows full ROM no tenderness or swelling Wrists full ROM no tenderness or swelling Fingers full ROM first CMC joint squaring of both hands, fifth DIP bony nodule with flexion deformity and mild overlying erythema on left  hand Left knee crepitus with joint line tenderness to palpation no effusion   Investigation: No additional findings.  Imaging: No results found.  Recent Labs: Lab Results  Component Value Date   WBC 6.6 01/17/2021   HGB 14.0 01/17/2021   PLT 330 01/17/2021   NA 138 01/17/2021   K 3.7 01/17/2021   CL 104 01/17/2021   CO2 25 01/17/2021   GLUCOSE 122 (H) 01/17/2021   BUN 14 01/17/2021   CREATININE 0.75 01/17/2021   CALCIUM 9.0 01/17/2021   GFRAA >60 01/20/2019    Speciality Comments: No specialty comments available.  Procedures:  No procedures performed Allergies: Patient has no known allergies.   Assessment / Plan:     Visit Diagnoses: Generalized osteoarthritis of multiple sites - Plan: meloxicam (MOBIC) 7.5 MG tablet  Discussed generalized osteoarthritis and lack of  disease modifying treatment but symptom management options.  She was apparently told to stop taking Aleve in the past to prevent side effects but she has no strict contraindication to NSAIDs such as gastrointestinal bleeding, anticoagulation, or significant renal disease.  Will try starting on meloxicam 7.5 mg daily as needed.  Provided printed information on supplement and local therapy options to try. We can follow up to recheck if symptoms not improving much or to recheck any definite signs of inflammation develop.  Psoriasis - Plan: clobetasol (TEMOVATE) 0.05 % external solution  Scalp rash with itching minimal erythema and scaling on exam today.  She has had issues with the topical triamcinolone for affected areas.  Will try prescribing topical steroid solution for scalp up to twice daily on the affected areas to see if this is more effective and or better tolerated.  Bilateral hand pain  No synovitis again on repeat exam today.  X-ray imaging just consistent with generalized osteoarthritis so recommending symptomatic management as above.  Chronic right shoulder pain  Probably some osteoarthritis also  suspect contribution of bursitis or tendinopathy contributing as passive range of motion is good.  Orders: No orders of the defined types were placed in this encounter.  Meds ordered this encounter  Medications   meloxicam (MOBIC) 7.5 MG tablet    Sig: Take 1 tablet (7.5 mg total) by mouth daily as needed for pain.    Dispense:  30 tablet    Refill:  3   clobetasol (TEMOVATE) 0.05 % external solution    Sig: Apply 1 Application topically 2 (two) times daily as needed.    Dispense:  50 mL    Refill:  0     Follow-Up Instructions: Return in about 3 months (around 07/07/2022), or if symptoms worsen or fail to improve, for OA/?ANA f/u 62mos PRN.   Collier Salina, MD  Note - This record has been created using [employer].  Chart creation errors have been sought, but may not always  have been located. Such creation errors do not reflect on  the standard of medical care.

## 2022-05-08 ENCOUNTER — Encounter: Payer: Medicare Other | Admitting: Internal Medicine

## 2022-07-10 ENCOUNTER — Ambulatory Visit: Payer: Medicare Other | Admitting: Internal Medicine

## 2022-07-28 ENCOUNTER — Other Ambulatory Visit: Payer: Self-pay | Admitting: Internal Medicine

## 2022-07-28 DIAGNOSIS — M159 Polyosteoarthritis, unspecified: Secondary | ICD-10-CM

## 2022-07-30 NOTE — Telephone Encounter (Signed)
Last Fill: 04/06/2022  Labs: 01/17/2021  Glucose 122   Next Visit: 08/13/2202  Last Visit: 04/06/2022  DX: Generalized osteoarthritis of multiple sites   Current Dose per office note 04/06/2022: meloxicam (MOBIC) 7.5 MG tablet   Okay to refill Meloxicam?

## 2022-08-08 NOTE — Progress Notes (Addendum)
Office Visit Note  Patient: [name]             Date of Birth: 1945/12/16           MRN: [id number]             PCP: [name], PA Referring: [name], PA Visit Date: 08/13/2022   Subjective:  Follow-up (Doing good)   History of Present Illness: [name] is a 77 y.o. female here for follow up for joint pain and skin rashes with positive ANA.  Since her last visit she is overall doing fairly well he is taking the meloxicam 7.5 mg once every day when interrupting this she had an increase in joint pain and stiffness after about 3 days.  But tolerating this without any particular side effects.  She still having issues with getting discomfort swelling and redness affecting her feet usually at that end of the day and overnight.  Pain in her hands is most often at the thumbs or at the distal joint of the small fingers does not see this similar type of swelling or discoloration here.  Previous HPI 04/06/2022 [name] is a 77 y.o. female here for follow up for joint pain and skin rashes with positive ANA.  Lab results from initial visit were negative for specific antibody markers there was a mildly elevated C-reactive protein of 12.3.  X-ray of bilateral hands was consistent with osteoarthritis with no inflammatory disease changes.  Since her last visit she continues having similar symptoms with joint pains particularly notices issues in her neck and back and shoulders as well as both hands and both feet.  She has not had as many episodes of toe redness swelling and painful sensation but still occur sometimes.  She has been using the topical triamcinolone on skin rashes which is helpful but does not use this very regularly because it causes some skin burning and irritation.   Previous HPI 02/23/22 [name] is a 77 y.o. female here for evaluation of joint pain in multiple areas with positive ANA and patient has history of psoriasis. Apparently recent dermatology evaluation  not as certain about psoriasis as her diagnosis but has been present chronically waxing and waning in the past. 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 Delbert Harness. She has been treated with evenity for osteopenia. She has longstanding history of psoriasis typically controlled with topical medications and has never been on long-term systemic medication just occasional short-term prednisone for exacerbation.  She moved from [place] about 3 years ago to be closer to family after retiring.  She experienced major flareup of skin disease especially bad areas on her scalp and lower extremities and completed a course of topical steroids did not afford the other prescribed medicine. She has neuropathy affecting distal foot and toes but with increased redness and pain in that area. She was concerned for possible gout due to acute exacerbation but uric acid level was 5.4.  She has longstanding history of abnormal labs raising concern for lupus but has never been diagnosed this goes back several decades.   Labs reviewed ANA 1:320 DFS RF neg CCP neg ESR 28 Uric acid 5.4   Review of Systems  Constitutional:  Negative for fatigue.  HENT:  Positive for mouth dryness. Negative for mouth sores.   Eyes:  Positive for dryness.  Respiratory:  Negative for shortness of breath.   Cardiovascular:  Negative for chest pain and palpitations.  Gastrointestinal:  Negative for blood in stool, constipation and diarrhea.  Endocrine: Negative for increased urination.  Genitourinary:  Negative for involuntary urination.  Musculoskeletal:  Positive for joint pain, gait problem, joint pain, joint swelling, morning stiffness and muscle tenderness. Negative for myalgias, muscle weakness and myalgias.  Skin:  Positive for color change, rash, hair loss and sensitivity to sunlight.  Allergic/Immunologic: Negative for susceptible to infections.  Neurological:   Negative for dizziness and headaches.  Hematological:  Negative for swollen glands.  Psychiatric/Behavioral:  Positive for sleep disturbance. Negative for depressed mood. The patient is not nervous/anxious.     PMFS History:  Patient Active Problem List   Diagnosis Date Noted   Peripheral edema 08/13/2022   Pain in right shoulder 04/06/2022   Bilateral hand pain 02/23/2022   Positive ANA (antinuclear antibody) 02/23/2022   Hallux valgus, acquired 07/01/2019   Abnormal EKG 01/20/2019   Tinnitus of right ear    Psoriasis    Generalized osteoarthritis of multiple sites    Osteopenia 01/05/2019    Past Medical History:  Diagnosis Date   Arthritis    Psoriasis    Intermittent; all over body and waxes and wanes   Tinnitus of right ear 2013   Intermittent; Melatonin has helped   Vaginal delivery 1973, 1981    Family History  Problem Relation Age of Onset   Dementia Mother    Heart disease Father        CABG x 5   Psoriasis Father    Psoriasis Sister    Osteoarthritis Sister    Pneumonia Maternal Grandfather    Heart attack Paternal Grandmother    Past Surgical History:  Procedure Laterality Date   goiter  1968   was on her Thyroid   IR KYPHO THORACIC WITH BONE BIOPSY  01/20/2019   JOINT REPLACEMENT     Knee replacement Right 2019   TOTAL HIP ARTHROPLASTY     Lt 2002, Rt 2005   VEIN LIGATION AND STRIPPING Right 2005   Social History   Social History Narrative   Was a Runner, 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  Pulmonary:     Effort: Pulmonary effort is normal.     Breath sounds: Normal breath sounds.  Musculoskeletal:     Right lower leg: Edema present.     Left lower leg: Edema present.     Comments: 1+ pitting edema at both ankles there is mild overlying petechial rash no ulcers or lesions numerous superficial tortuous veins  Skin:    Findings: Rash present.  Neurological:     Mental Status: She is alert.  Psychiatric:        Mood and Affect: Mood normal.      Musculoskeletal Exam:  Shoulders full ROM no tenderness or swelling Elbows full ROM no tenderness or swelling Wrists full ROM no tenderness or swelling Fingers with first CMC joint  squaring bilaterally, fifth DIP bony nodules and mild fixed flexion deformity Left knee crepitus and medial joint line tenderness to pressure, no palpable effusion Severe first MTP joint lateral deviation on both feet  Investigation: No additional findings.  Imaging: No results found.  Recent Labs: Lab Results  Component Value Date   WBC 6.6 01/17/2021   HGB 14.0 01/17/2021   PLT 330 01/17/2021   NA 138 01/17/2021   K 3.7 01/17/2021   CL 104 01/17/2021   CO2 25 01/17/2021   GLUCOSE 122 (H) 01/17/2021   BUN 14 01/17/2021   CREATININE 0.75 01/17/2021   CALCIUM 9.0 01/17/2021   GFRAA >60 01/20/2019    Speciality Comments: No specialty comments available.  Procedures:  No procedures performed Allergies: Patient has no known allergies.   Assessment / Plan:     Visit Diagnoses: Generalized osteoarthritis of multiple sites - Plan: meloxicam (MOBIC) 7.5 MG tablet, C-reactive protein  Hand shoulder and knee pain I think all primarily due to underlying osteoarthritis. .  Rechecking the previous mildly elevated CRP but do not see evidence of peripheral joint synovitis.  She is getting a good benefit without obvious side effects on the meloxicam will continue at 7.5  mg daily.  High risk medication use - meloxicam 7.5 mg daily as needed - Plan: CBC with Differential/Platelet, BASIC METABOLIC PANEL WITH GFR  Checking CBC and BMP for medication monitoring on long-term continued use of daily NSAIDs and as needed Tylenol.  Also reviewed her osteoporosis medicines has been on the what sounds like completed Evenity treatment followed by transition to antiresorptive therapy such as annual Reclast infusion. Needs to f/u with her prescribing office but may be at timing to cnisdre drug holiday if appropriate.  Psoriasis - clobetasol (TEMOVATE) 0.05 % external solution  Can continue topical steroid as needed not currently requiring regular use.  Peripheral edema Mild pitting edema Foot pain, bilateral - Plan: Uric acid  Discussed the nighttime foot pain symptoms there was some question about possible gout previous uric acid was not particularly elevated we can recheck this again 1 more time.  I think this is much less likely to explain her symptoms.  More likely could be mild peripheral neuropathy versus some localized inflammation due to peripheral edema.  Orders: Orders Placed This Encounter  Procedures   C-reactive protein   CBC with Differential/Platelet   BASIC METABOLIC PANEL WITH GFR   Uric acid   Meds ordered this encounter  Medications   meloxicam (MOBIC) 7.5 MG tablet    Sig: Take 1 tablet (7.5 mg total) by mouth daily. for pain    Dispense:  90 tablet    Refill:  1     Follow-Up Instructions: No follow-ups on file.   Fuller Plan, MD  Note - This record has been created using [employer].  Chart creation errors have been sought, but may not always  have been located. Such creation errors do not reflect on  the standard of medical care.

## 2022-08-13 ENCOUNTER — Encounter: Payer: Self-pay | Admitting: Internal Medicine

## 2022-08-13 ENCOUNTER — Ambulatory Visit: Payer: Medicare Other | Attending: Internal Medicine | Admitting: Internal Medicine

## 2022-08-13 VITALS — BP 116/69 | HR 72 | Resp 16 | Ht 68.0 in | Wt 203.0 lb

## 2022-08-13 DIAGNOSIS — G8929 Other chronic pain: Secondary | ICD-10-CM | POA: Diagnosis present

## 2022-08-13 DIAGNOSIS — M25511 Pain in right shoulder: Secondary | ICD-10-CM | POA: Insufficient documentation

## 2022-08-13 DIAGNOSIS — Z79899 Other long term (current) drug therapy: Secondary | ICD-10-CM | POA: Diagnosis present

## 2022-08-13 DIAGNOSIS — M79672 Pain in left foot: Secondary | ICD-10-CM | POA: Insufficient documentation

## 2022-08-13 DIAGNOSIS — M79671 Pain in right foot: Secondary | ICD-10-CM | POA: Diagnosis present

## 2022-08-13 DIAGNOSIS — M159 Polyosteoarthritis, unspecified: Secondary | ICD-10-CM | POA: Insufficient documentation

## 2022-08-13 DIAGNOSIS — R600 Localized edema: Secondary | ICD-10-CM | POA: Insufficient documentation

## 2022-08-13 DIAGNOSIS — M79641 Pain in right hand: Secondary | ICD-10-CM | POA: Diagnosis present

## 2022-08-13 DIAGNOSIS — L409 Psoriasis, unspecified: Secondary | ICD-10-CM | POA: Insufficient documentation

## 2022-08-13 DIAGNOSIS — M79642 Pain in left hand: Secondary | ICD-10-CM | POA: Insufficient documentation

## 2022-08-13 LAB — CBC WITH DIFFERENTIAL/PLATELET
Absolute Monocytes: 334 cells/uL (ref 200–950)
Basophils Absolute: 50 cells/uL (ref 0–200)
Basophils Relative: 0.8 %
Eosinophils Absolute: 258 cells/uL (ref 15–500)
Eosinophils Relative: 4.1 %
HCT: 42.9 % (ref 35.0–45.0)
Hemoglobin: 14.1 g/dL (ref 11.7–15.5)
Lymphs Abs: 1386 cells/uL (ref 850–3900)
MCH: 29.4 pg (ref 27.0–33.0)
MCHC: 32.9 g/dL (ref 32.0–36.0)
MCV: 89.6 fL (ref 80.0–100.0)
MPV: 9.5 fL (ref 7.5–12.5)
Monocytes Relative: 5.3 %
Neutro Abs: 4271 cells/uL (ref 1500–7800)
Neutrophils Relative %: 67.8 %
Platelets: 311 10*3/uL (ref 140–400)
RBC: 4.79 10*6/uL (ref 3.80–5.10)
RDW: 12.9 % (ref 11.0–15.0)
Total Lymphocyte: 22 %
WBC: 6.3 10*3/uL (ref 3.8–10.8)

## 2022-08-13 MED ORDER — MELOXICAM 7.5 MG PO TABS: 7.5000 mg | ORAL_TABLET | Freq: Every day | ORAL | 1 refills | Status: DC

## 2022-09-03 ENCOUNTER — Telehealth: Payer: Self-pay | Admitting: *Deleted

## 2022-09-03 NOTE — Telephone Encounter (Signed)
Now addressed in associated result note.

## 2022-09-03 NOTE — Telephone Encounter (Signed)
Patient advised in lab results note.

## 2022-09-03 NOTE — Telephone Encounter (Signed)
Patient contacted the office stating she was in the office on 08/13/2022 and had labs done. Patient would like to have those results. Please advise.

## 2022-09-03 NOTE — Progress Notes (Signed)
CRP improved from 12.3 to 7.0 this is normal. Blood count is normal. Metabolic panel shows normal kidney and liver function glucose of 121 is within normal for a non-fasting level. Uric acid of 5.7 does not suggest gout.

## 2022-09-18 NOTE — Progress Notes (Signed)
[name] is a 77 y.o. female here for a new problem.  History of Present Illness:   Chief Complaint  Patient presents with   Leg Problem    Pt c/o discoloration of right out lower extremity, area was red, swelling yesterday and burning sensation off and on past 2 weeks.    HPI  Rash Right Lower Leg Notes red, itchy rash on her right lower leg since 09/05/22. Has swelling in her right lower leg. She usually moisturizes this area but not this morning. She had a similar rash 2 years ago. She wears compression stockings but these cause her legs to become red and irritated. She uses CeraVe lotion. Seen in the ED for slow healing wound on her right lower extremity in 1/23 and wore a wound vacuum for some time.  Pain Bilateral Feet Seen by rheumatologist, Dr. Dimple Casey and uric acid did not suggest gout. Her toes are red upon waking up and she wonders if this is related to neuropathy. She reads frequently at home but she tries to elevate feet. Feet are not cold. Has some back pain when walking.  She is agreeable to updating her blood work today. Has had recent elevated glucose on CMP and is overdue for lipid panel.  Per wound care documentation, she has been diagnosis with chronic venous insufficiency in the past  Past Medical History:  Diagnosis Date   Arthritis    Psoriasis    Intermittent; all over body and waxes and wanes   Tinnitus of right ear 2013   Intermittent; Melatonin has helped   Vaginal delivery 1973, 1981     Social History   Tobacco Use   Smoking status: Never    Passive exposure: Never   Smokeless tobacco: Never  Vaping Use   Vaping status: Never Used  Substance Use Topics   Alcohol use: Never   Drug use: Never    Past Surgical History:  Procedure Laterality Date   goiter  1968   was on her Thyroid   IR KYPHO THORACIC WITH BONE BIOPSY  01/20/2019   JOINT REPLACEMENT     Knee replacement Right 2019   TOTAL HIP ARTHROPLASTY     Lt 2002, Rt 2005    VEIN LIGATION AND STRIPPING Right 2005    Family History  Problem Relation Age of Onset   Dementia Mother    Heart disease Father        CABG x 5   Psoriasis Father    Psoriasis Sister    Osteoarthritis Sister    Pneumonia Maternal Grandfather    Heart attack Paternal Grandmother     No Known Allergies  Current Medications:   Current Outpatient Medications:    acetaminophen (TYLENOL) 650 MG CR tablet, Take 650 mg by mouth every 8 (eight) hours as needed for pain., Disp: , Rfl:    clobetasol (TEMOVATE) 0.05 % external solution, Apply 1 Application topically 2 (two) times daily as needed., Disp: 50 mL, Rfl: 0   dorzolamide-timolol (COSOPT) 22.3-6.8 MG/ML ophthalmic solution, 1 drop 2 (two) times daily., Disp: , Rfl:    levocetirizine (XYZAL) 5 MG tablet, TAKE 1 TABLET BY MOUTH EVERY DAY IN THE EVENING, Disp: 90 tablet, Rfl: 1   meloxicam (MOBIC) 7.5 MG tablet, Take 1 tablet (7.5 mg total) by mouth daily. for pain, Disp: 90 tablet, Rfl: 1   SYSTANE ULTRA 0.4-0.3 % SOLN, , Disp: , Rfl:    triamcinolone cream (KENALOG) 0.1 %, Apply 1 application topically 2 (two) times  daily., Disp: , Rfl:    Review of Systems:   Review of Systems  Constitutional:  Negative for fever and malaise/fatigue.  HENT:  Negative for congestion.   Eyes:  Negative for blurred vision.  Respiratory:  Negative for cough and shortness of breath.   Cardiovascular:  Negative for chest pain, palpitations and leg swelling.  Gastrointestinal:  Negative for vomiting.  Musculoskeletal:  Positive for back pain.  Skin:  Positive for itching and rash (Right lower leg).  Neurological:  Negative for loss of consciousness and headaches.    Vitals:   Vitals:   09/19/22 1046  BP: 120/80  Pulse: 73  Temp: (!) 97.3 F (36.3 C)  TempSrc: Temporal  SpO2: 96%  Weight: 197 lb (89.4 kg)  Height: 5\' 8"  (1.727 m)     Body mass index is 29.95 kg/m.  Physical Exam:   Physical Exam Vitals and nursing note reviewed.   Constitutional:      General: She is not in acute distress.    Appearance: She is well-developed. She is not ill-appearing or toxic-appearing.  Cardiovascular:     Rate and Rhythm: Normal rate and regular rhythm.     Pulses: Normal pulses.          Dorsalis pedis pulses are 2+ on the right side and 2+ on the left side.       Posterior tibial pulses are 2+ on the right side and 2+ on the left side.     Heart sounds: Normal heart sounds, S1 normal and S2 normal.     Comments: Adequate capillary refill in b/l toes Pulmonary:     Effort: Pulmonary effort is normal.     Breath sounds: Normal breath sounds.  Musculoskeletal:     Right lower leg: 1+ Edema present.     Left lower leg: No edema.  Skin:    General: Skin is warm and dry.     Comments: Multiple varicose veins in b/l feet, ankles and lower legs  Area of hyperpigmentation to lateral aspect of right lower extremity with tenderness to palpation/swelling/fluctuance/discharge  Neurological:     Mental Status: She is alert.     GCS: GCS eye subscore is 4. GCS verbal subscore is 5. GCS motor subscore is 6.  Psychiatric:        Speech: Speech normal.        Behavior: Behavior normal. Behavior is cooperative.     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   I,  [name], [place], have reviewed all documentation for this visit. The documentation on 09/19/22 for the exam, diagnosis, procedures, and orders are all accurate and complete.    [name], PA-C

## 2022-09-19 ENCOUNTER — Encounter: Payer: Self-pay | Admitting: Physician Assistant

## 2022-09-19 ENCOUNTER — Ambulatory Visit (INDEPENDENT_AMBULATORY_CARE_PROVIDER_SITE_OTHER): Payer: Medicare Other | Admitting: Physician Assistant

## 2022-09-19 VITALS — BP 120/80 | HR 73 | Temp 97.3°F | Ht 68.0 in | Wt 197.0 lb

## 2022-09-19 DIAGNOSIS — E785 Hyperlipidemia, unspecified: Secondary | ICD-10-CM

## 2022-09-19 DIAGNOSIS — R7309 Other abnormal glucose: Secondary | ICD-10-CM | POA: Diagnosis not present

## 2022-09-19 DIAGNOSIS — I87301 Chronic venous hypertension (idiopathic) without complications of right lower extremity: Secondary | ICD-10-CM

## 2022-09-19 LAB — LIPID PANEL
Cholesterol: 178 mg/dL (ref 0–200)
HDL: 41.2 mg/dL (ref >39.00)
LDL Cholesterol: 109 mg/dL — ABNORMAL HIGH (ref 0–99)
NonHDL: 137.27
Total CHOL/HDL Ratio: 4
Triglycerides: 143 mg/dL (ref 0.0–149.0)
VLDL: 28.6 mg/dL (ref 0.0–40.0)

## 2022-09-19 LAB — HEMOGLOBIN A1C: Hgb A1c MFr Bld: 5.7 % (ref 4.6–6.5)

## 2022-09-19 NOTE — Patient Instructions (Signed)
It was great to see you!  We will update blood work today  I will also order ultrasounds of you legs  I suspect that you have some chronic venous insufficiency (see handout) -- contributing to your symptom(s)   Take care,  [name] PA-C

## 2022-09-24 ENCOUNTER — Telehealth: Payer: Self-pay | Admitting: Physician Assistant

## 2022-09-24 NOTE — Telephone Encounter (Signed)
Pt would like a call back with lab results 

## 2022-09-24 NOTE — Telephone Encounter (Signed)
Advised pt of lab results. Pt not wanting to start a Statin medication at this time but will discuss with her spouse and let PCP know. Pt verbalized understanding of lab results

## 2022-09-26 ENCOUNTER — Ambulatory Visit (HOSPITAL_COMMUNITY)
Admission: RE | Admit: 2022-09-26 | Discharge: 2022-09-26 | Disposition: A | Discharge status: Home / Self Care | Payer: Medicare Other | Source: Ambulatory Visit | Attending: Physician Assistant | Admitting: Physician Assistant

## 2022-09-26 DIAGNOSIS — I87301 Chronic venous hypertension (idiopathic) without complications of right lower extremity: Secondary | ICD-10-CM | POA: Diagnosis not present

## 2022-10-01 ENCOUNTER — Other Ambulatory Visit: Payer: Self-pay | Admitting: *Deleted

## 2022-10-01 DIAGNOSIS — I87301 Chronic venous hypertension (idiopathic) without complications of right lower extremity: Secondary | ICD-10-CM

## 2022-10-10 ENCOUNTER — Ambulatory Visit (INDEPENDENT_AMBULATORY_CARE_PROVIDER_SITE_OTHER): Payer: Medicare Other | Admitting: Vascular Surgery

## 2022-10-10 ENCOUNTER — Encounter: Payer: Self-pay | Admitting: Vascular Surgery

## 2022-10-10 VITALS — BP 157/90 | HR 69 | Temp 97.6°F | Wt 197.0 lb

## 2022-10-10 DIAGNOSIS — I872 Venous insufficiency (chronic) (peripheral): Secondary | ICD-10-CM

## 2022-10-10 NOTE — Progress Notes (Signed)
ASSESSMENT & PLAN   CHRONIC VENOUS INSUFFICIENCY: This patient has chronic venous insufficiency with both superficial and deep venous reflux.  We have discussed conservative measures.  I have encouraged her to avoid prolonged sitting and standing.  We discussed the importance of exercise specifically walking and water aerobics.  I encouraged her to try a knee-high compression stocking with a gradient of 15 to 20 mmHg.  If this was not helpful she could try a thigh-high stocking with a gradient of 20 to 30 mmHg.  We also discussed the importance of daily leg elevation and the proper positioning for this.  We also discussed the importance of maintaining a healthy weight.  If her symptoms progress and thigh-high stockings are not helpful I think she would be a candidate for staged laser ablation of the right great saphenous vein and potentially the right anterior accessory saphenous vein.  She will call if her symptoms progress.  REASON FOR CONSULT:    Chronic venous insufficiency.  The consult is requested by [name] Motto, PA.  HPI:   [name] is a 77 y.o. female who is referred with chronic venous insufficiency.  She describes significant aching pain and heaviness in her legs which is aggravated by sitting and standing and relieved with elevation.  She does not routinely wear stockings.  She does state that she had a venous procedure about 20 years ago on her right leg.  She is not sure if this was stabs or ligation and stripping.  She has had no previous history of DVT.  Past Medical History:  Diagnosis Date   Arthritis    Psoriasis    Intermittent; all over body and waxes and wanes   Tinnitus of right ear 2013   Intermittent; Melatonin has helped   Vaginal delivery 1973, 1981    Family History  Problem Relation Age of Onset   Dementia Mother    Heart disease Father        CABG x 5   Psoriasis Father    Psoriasis Sister    Osteoarthritis Sister    Pneumonia Maternal  Grandfather    Heart attack Paternal Grandmother     SOCIAL HISTORY: Social History   Tobacco Use   Smoking status: Never    Passive exposure: Never   Smokeless tobacco: Never  Substance Use Topics   Alcohol use: Never    No Known Allergies  Current Outpatient Medications  Medication Sig Dispense Refill   acetaminophen (TYLENOL) 650 MG CR tablet Take 650 mg by mouth every 8 (eight) hours as needed for pain.     clobetasol (TEMOVATE) 0.05 % external solution Apply 1 Application topically 2 (two) times daily as needed. 50 mL 0   dorzolamide-timolol (COSOPT) 22.3-6.8 MG/ML ophthalmic solution 1 drop 2 (two) times daily.     levocetirizine (XYZAL) 5 MG tablet TAKE 1 TABLET BY MOUTH EVERY DAY IN THE EVENING 90 tablet 1   meloxicam (MOBIC) 7.5 MG tablet Take 1 tablet (7.5 mg total) by mouth daily. for pain 90 tablet 1   SYSTANE ULTRA 0.4-0.3 % SOLN      triamcinolone cream (KENALOG) 0.1 % Apply 1 application topically 2 (two) times daily.     No current facility-administered medications for this visit.    REVIEW OF SYSTEMS:  [X]  denotes positive finding, [ ]  denotes negative finding Cardiac  Comments:  Chest pain or chest pressure:    Shortness of breath upon exertion:    Short of breath when lying flat:  Irregular heart rhythm:        Vascular    Pain in calf, thigh, or hip brought on by ambulation: x   Pain in feet at night that wakes you up from your sleep:  x   Blood clot in your veins:    Leg swelling:  x       Pulmonary    Oxygen at home:    Productive cough:     Wheezing:         Neurologic    Sudden weakness in arms or legs:     Sudden numbness in arms or legs:     Sudden onset of difficulty speaking or slurred speech:    Temporary loss of vision in one eye:     Problems with dizziness:         Gastrointestinal    Blood in stool:     Vomited blood:         Genitourinary    Burning when urinating:     Blood in urine:        Psychiatric    Major  depression:         Hematologic    Bleeding problems:    Problems with blood clotting too easily:        Skin    Rashes or ulcers: x       Constitutional    Fever or chills:    -  PHYSICAL EXAM:   Vitals:   10/10/22 1413  BP: (!) 157/90  Pulse: 69  Temp: 97.6 F (36.4 C)  TempSrc: Temporal  SpO2: 96%  Weight: 197 lb (89.4 kg)   Body mass index is 29.95 kg/m. GENERAL: The patient is a well-nourished female, in no acute distress. The vital signs are documented above. CARDIAC: There is a regular rate and rhythm.  VASCULAR: I do not detect carotid bruits. She has palpable pedal pulses. Does have some hyperpigmentation bilaterally. I did look at her superficial veins on the right.  She has significant reflux in her right great saphenous vein down to the proximal calf and also reflux in her anterior accessory saphenous vein. PULMONARY: There is good air exchange bilaterally without wheezing or rales. ABDOMEN: Soft and non-tender with normal pitched bowel sounds.  MUSCULOSKELETAL: There are no major deformities. NEUROLOGIC: No focal weakness or paresthesias are detected. SKIN: There are no ulcers or rashes noted. PSYCHIATRIC: The patient has a normal affect.  DATA:    VENOUS DUPLEX: I have independently interpreted the patient's venous duplex scan that was done on 09/26/2022.  This was of the right lower extremity only.  There was no evidence of DVT.  There was deep venous reflux involving the common femoral vein and popliteal vein.  There was superficial venous reflux in the right great saphenous vein down to the proximal calf.  Diameters of the vein ranged from 4.0-6.4 mm.  There was reflux in the anterior accessory saphenous vein with a diameter of 6.6 mm.  There was some reflux in the small saphenous vein although this vein was not dilated (3 mm).  The results of the study are summarized in the diagram below.    Waverly [facility]

## 2022-12-07 ENCOUNTER — Telehealth: Payer: Self-pay | Admitting: Pharmacy Technician

## 2022-12-07 ENCOUNTER — Other Ambulatory Visit: Payer: Self-pay

## 2022-12-07 DIAGNOSIS — M810 Age-related osteoporosis without current pathological fracture: Secondary | ICD-10-CM | POA: Insufficient documentation

## 2022-12-07 NOTE — Telephone Encounter (Signed)
Auth Submission: NO AUTH NEEDED Site of care: Site of care: [facility] Payer: MEDICARE A/B & [employer]ET[employer] SUPP Medication & CPT/J Code(s) submitted: Reclast (Zolendronic acid) [id number] Route of submission (phone, fax, portal):  Phone # Fax # Auth type: Buy/Bill HB Units/visits requested: X1 Reference number:  Approval from: 12/07/22 to 01/15/23

## 2023-01-10 ENCOUNTER — Ambulatory Visit: Payer: Medicare Other | Admitting: *Deleted

## 2023-01-10 VITALS — BP 129/82 | HR 76 | Temp 97.8°F | Resp 16 | Ht 67.0 in | Wt 195.8 lb

## 2023-01-10 DIAGNOSIS — M810 Age-related osteoporosis without current pathological fracture: Secondary | ICD-10-CM | POA: Diagnosis not present

## 2023-01-10 MED ORDER — SODIUM CHLORIDE 0.9 % IV SOLN
INTRAVENOUS | Status: DC
Start: 2023-01-10 — End: 2023-01-10

## 2023-01-10 MED ORDER — ZOLEDRONIC ACID 5 MG/100ML IV SOLN
5.0000 mg | Freq: Once | INTRAVENOUS | Status: AC
  Administered 2023-01-10: 5 mg via INTRAVENOUS
  Filled 2023-01-10: qty 100

## 2023-01-10 MED ORDER — ACETAMINOPHEN 325 MG PO TABS
650.0000 mg | ORAL_TABLET | Freq: Once | ORAL | Status: DC
Start: 2023-01-10 — End: 2023-01-10

## 2023-01-10 MED ORDER — DIPHENHYDRAMINE HCL 25 MG PO CAPS
25.0000 mg | ORAL_CAPSULE | Freq: Once | ORAL | Status: DC
Start: 2023-01-10 — End: 2023-01-10

## 2023-01-10 NOTE — Addendum Note (Signed)
Addended by: Governor Specking A on: 01/10/2023 02:20 PM   Modules accepted: Orders

## 2023-01-10 NOTE — Progress Notes (Signed)
Diagnosis: Osteoporosis  Provider:  Chilton Greathouse MD  Procedure: IV Infusion  IV Type: Peripheral, IV Location: R Antecubital  Reclast (Zolendronic Acid), Dose: 5 mg  Infusion Start Time: 1329 pm  Infusion Stop Time: 1400 pm  Post Infusion IV Care: Observation period completed and Peripheral IV Discontinued  Discharge: Condition: Good, Destination: Home . AVS Provided  Performed by:  Forrest Moron, RN

## 2023-02-05 NOTE — Progress Notes (Unsigned)
Office Visit Note  Patient: [name]             Date of Birth: [birth year]           MRN: [id number]             PCP: [name], PA Referring: [name], PA Visit Date: 02/13/2023   Subjective:  Follow-up (Patient states she was reading about evenity side effect that include burning, redness, foot pain, swelling. )   History of Present Illness: [name] is a 78 y.o. female here for follow up for joint pain and skin rashes with positive ANA.    Previous HPI 08/13/2022 [name] is a 78 y.o. female here for follow up for joint pain and skin rashes with positive ANA.  Since her last visit she is overall doing fairly well he is taking the meloxicam 7.5 mg once every day when interrupting this she had an increase in joint pain and stiffness after about 3 days.  But tolerating this without any particular side effects.  She still having issues with getting discomfort swelling and redness affecting her feet usually at that end of the day and overnight.  Pain in her hands is most often at the thumbs or at the distal joint of the small fingers does not see this similar type of swelling or discoloration here.   Previous HPI 04/06/2022 [name] is a 78 y.o. female here for follow up for joint pain and skin rashes with positive ANA.  Lab results from initial visit were negative for specific antibody markers there was a mildly elevated C-reactive protein of 12.3.  X-ray of bilateral hands was consistent with osteoarthritis with no inflammatory disease changes.  Since her last visit she continues having similar symptoms with joint pains particularly notices issues in her neck and back and shoulders as well as both hands and both feet.  She has not had as many episodes of toe redness swelling and painful sensation but still occur sometimes.  She has been using the topical triamcinolone on skin rashes which is helpful but does not use this very regularly because it causes some skin  burning and irritation.   Previous HPI 02/23/22 [name] is a 78 y.o. female here for evaluation of joint pain in multiple areas with positive ANA and patient has history of psoriasis. Apparently recent dermatology evaluation not as certain about psoriasis as her diagnosis but has been present chronically waxing and waning in the past. 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 Delbert Harness. She has been treated with evenity for osteopenia. She has longstanding history of psoriasis typically controlled with topical medications and has never been on long-term systemic medication just occasional short-term prednisone for exacerbation.  She moved from [place] about 3 years ago to be closer to family after retiring.  She experienced major flareup of skin disease especially bad areas on her scalp and lower extremities and completed a course of topical steroids did not afford the other prescribed medicine. She has neuropathy affecting distal foot and toes but with increased redness and pain in that area. She was concerned for possible gout due to acute exacerbation but uric acid level was 5.4.  She has longstanding history of abnormal labs raising concern for lupus but has never been diagnosed this goes back several decades.   Labs reviewed ANA 1:320 DFS RF neg CCP neg ESR 28 Uric acid 5.4   Review of Systems  Constitutional:  Negative for fatigue.  HENT:  Positive for mouth sores and mouth dryness.   Eyes:  Positive for dryness.  Respiratory:  Negative for shortness of breath.   Cardiovascular:  Negative for chest pain and palpitations.  Gastrointestinal:  Negative for blood in stool, constipation and diarrhea.  Endocrine: Negative for increased urination.  Genitourinary:  Negative for involuntary urination.  Musculoskeletal:  Positive for joint pain, gait problem, joint pain, joint swelling, myalgias, muscle weakness and  myalgias. Negative for morning stiffness and muscle tenderness.  Skin:  Positive for rash, hair loss and sensitivity to sunlight. Negative for color change.  Allergic/Immunologic: Negative for susceptible to infections.  Neurological:  Negative for dizziness and headaches.  Hematological:  Negative for swollen glands.  Psychiatric/Behavioral:  Positive for sleep disturbance. Negative for depressed mood. The patient is not nervous/anxious.     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   Peripheral edema 08/13/2022   Pain in right shoulder 04/06/2022   Bilateral hand pain 02/23/2022   Positive ANA (antinuclear antibody) 02/23/2022   Hallux valgus, acquired 07/01/2019   Abnormal EKG 01/20/2019   Tinnitus of right ear    Psoriasis    Generalized osteoarthritis of multiple sites    Osteopenia 01/05/2019    Past Medical History:  Diagnosis Date   Arthritis    Psoriasis    Intermittent; all over body and waxes and wanes   Tinnitus of right ear 2013   Intermittent; Melatonin has helped   Vaginal delivery 1973, 1981    Family History  Problem Relation Age of Onset   Dementia Mother    Heart disease Father        CABG x 5   Psoriasis Father    Psoriasis Sister    Osteoarthritis Sister    Pneumonia Maternal Grandfather    Heart attack Paternal Grandmother    Past Surgical History:  Procedure Laterality Date   goiter  1968   was on her Thyroid   IR KYPHO THORACIC WITH BONE BIOPSY  01/20/2019   JOINT REPLACEMENT     Knee replacement Right 2019   TOTAL HIP ARTHROPLASTY     Lt 2002, Rt 2005   VEIN LIGATION AND STRIPPING Right 2005   Social History   Social History Narrative   Was a Runner, 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   Moved to area from [place], [place] to be closer to family    Immunization History  Administered Date(s) Administered    Fluad Quad(high Dose 65+) 10/28/2019   Influenza, High Dose Seasonal PF 11/29/2021   Influenza-Unspecified 11/29/2021   PNEUMOCOCCAL CONJUGATE-20 07/12/2021   Tdap 07/12/2021   Zoster Recombinant(Shingrix) 02/06/2019, 04/25/2019     Objective: Vital Signs: BP 135/84 (BP Location: Left Arm, Patient Position: Sitting, Cuff Size: Large)   Pulse 62   Resp 14   Ht 5' 7.5" (1.715 m)   Wt 198 lb (89.8 kg)   BMI 30.55 kg/m    Physical Exam   Musculoskeletal Exam: ***  CDAI Exam: CDAI Score: -- Patient Global: --; Provider Global: -- Swollen: --; Tender: -- Joint Exam 02/13/2023   No joint exam has been documented for this visit   There is currently no information documented on the homunculus. Go to the Rheumatology activity and complete the homunculus joint exam.  Investigation: No additional findings.  Imaging: No results found.  Recent Labs: Lab Results  Component Value Date   WBC 6.3 08/13/2022   HGB 14.1 08/13/2022   PLT 311 08/13/2022   NA 144 08/13/2022   K 4.1 08/13/2022   CL 106 08/13/2022   CO2 30 08/13/2022   GLUCOSE 121 (H) 08/13/2022   BUN 20 08/13/2022   CREATININE 0.86 08/13/2022   CALCIUM 9.6 08/13/2022   GFRAA >60 01/20/2019    Speciality Comments: No specialty comments available.  Procedures:  No procedures performed Allergies: Patient has no known allergies.   Assessment / Plan:     Visit Diagnoses: Generalized osteoarthritis of multiple sites - Plan: DULoxetine (CYMBALTA) 20 MG capsule, meloxicam (MOBIC) 7.5 MG tablet  High risk medication use - meloxicam will continue at 7.5 mg daily  Psoriasis - clobetasol (TEMOVATE) 0.05 % external solution  Peripheral edema  Foot pain, bilateral - Plan: DULoxetine (CYMBALTA) 20 MG capsule  Bilateral foot pain - Plan: DULoxetine (CYMBALTA) 20 MG capsule  ***  Orders: No orders of the defined types were placed in this encounter.  Meds ordered this encounter  Medications   DULoxetine  (CYMBALTA) 20 MG capsule    Sig: Take 1 capsule (20 mg total) by mouth daily.    Dispense:  30 capsule    Refill:  0   meloxicam (MOBIC) 7.5 MG tablet    Sig: Take 1 tablet (7.5 mg total) by mouth daily. for pain    Dispense:  90 tablet    Refill:  1     Follow-Up Instructions: Return in about 3 months (around 05/14/2023) for OA NSAID/SNRI start f/u 3mos.   Fuller Plan, MD  Note - This record has been created using [employer].  Chart creation errors have been sought, but may not always  have been located. Such creation errors do not reflect on  the standard of medical care.

## 2023-02-13 ENCOUNTER — Ambulatory Visit: Payer: Medicare Other | Attending: Internal Medicine | Admitting: Internal Medicine

## 2023-02-13 ENCOUNTER — Encounter: Payer: Self-pay | Admitting: Internal Medicine

## 2023-02-13 VITALS — BP 135/84 | HR 62 | Resp 14 | Ht 67.5 in | Wt 198.0 lb

## 2023-02-13 DIAGNOSIS — R600 Localized edema: Secondary | ICD-10-CM

## 2023-02-13 DIAGNOSIS — M79671 Pain in right foot: Secondary | ICD-10-CM | POA: Insufficient documentation

## 2023-02-13 DIAGNOSIS — M159 Polyosteoarthritis, unspecified: Secondary | ICD-10-CM

## 2023-02-13 DIAGNOSIS — L409 Psoriasis, unspecified: Secondary | ICD-10-CM

## 2023-02-13 DIAGNOSIS — Z79899 Other long term (current) drug therapy: Secondary | ICD-10-CM

## 2023-02-13 DIAGNOSIS — M79672 Pain in left foot: Secondary | ICD-10-CM | POA: Diagnosis present

## 2023-02-13 MED ORDER — DULOXETINE HCL 20 MG PO CPEP: 20.0000 mg | ORAL_CAPSULE | Freq: Every day | ORAL | 0 refills | Status: DC

## 2023-02-13 MED ORDER — MELOXICAM 7.5 MG PO TABS: 7.5000 mg | ORAL_TABLET | Freq: Every day | ORAL | 1 refills | Status: DC

## 2023-03-21 ENCOUNTER — Other Ambulatory Visit: Payer: Self-pay | Admitting: Internal Medicine

## 2023-03-21 DIAGNOSIS — M159 Polyosteoarthritis, unspecified: Secondary | ICD-10-CM

## 2023-03-21 DIAGNOSIS — M79672 Pain in left foot: Secondary | ICD-10-CM

## 2023-03-21 NOTE — Telephone Encounter (Signed)
 Last Fill: 02/13/2023  Next Visit: 05/13/2023  Last Visit: 02/13/2023  Dx: Generalized osteoarthritis of multiple sites   Current Dose per office note on 02/13/2023: Duloxetine (Cymbalta) 20mg  daily   Okay to refill Cymbalta?

## 2023-03-22 IMAGING — DX DG TIBIA/FIBULA 2V*R*
1 series · 2 of 2 positions shown · non-contrast
Comparison: None.

CLINICAL DATA: Injury, right leg pain.

EXAM:
RIGHT TIBIA AND FIBULA - 2 VIEW

[Series 1: leg · 0.14mm/px · 2 of 2 slices shown]
[im 1/2]
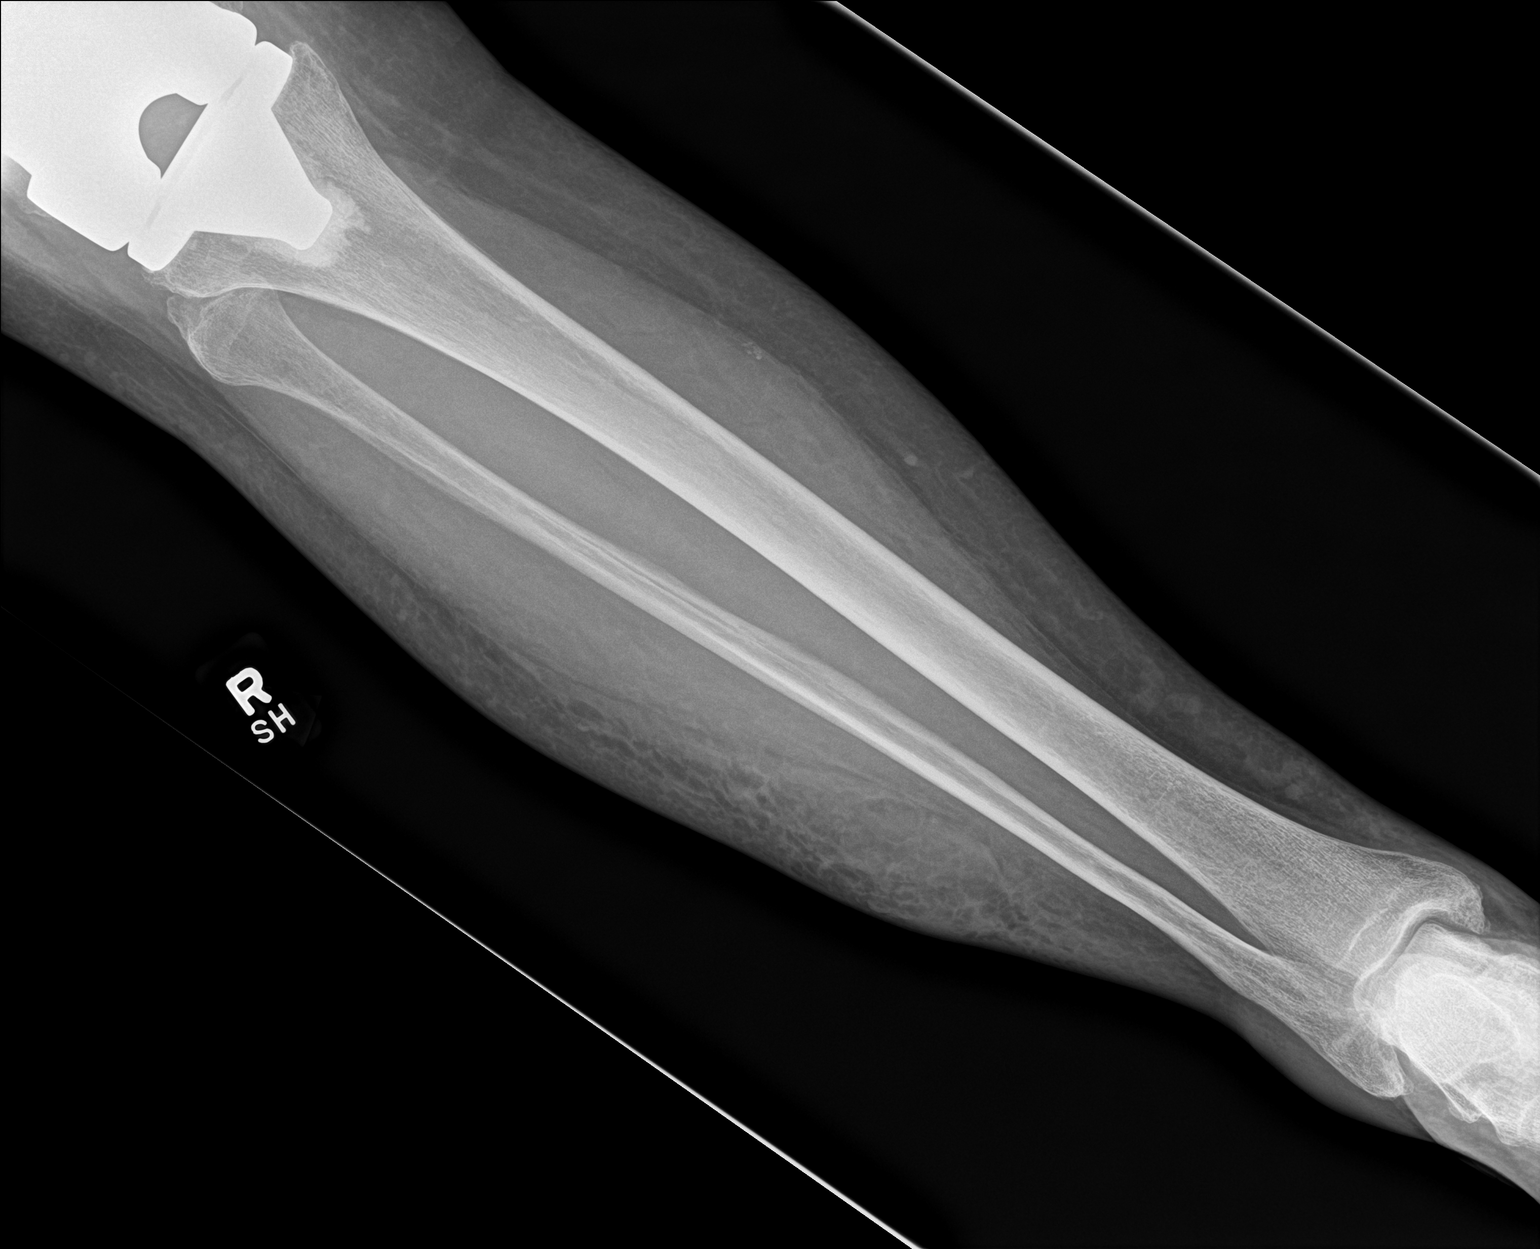
[im 2/2]
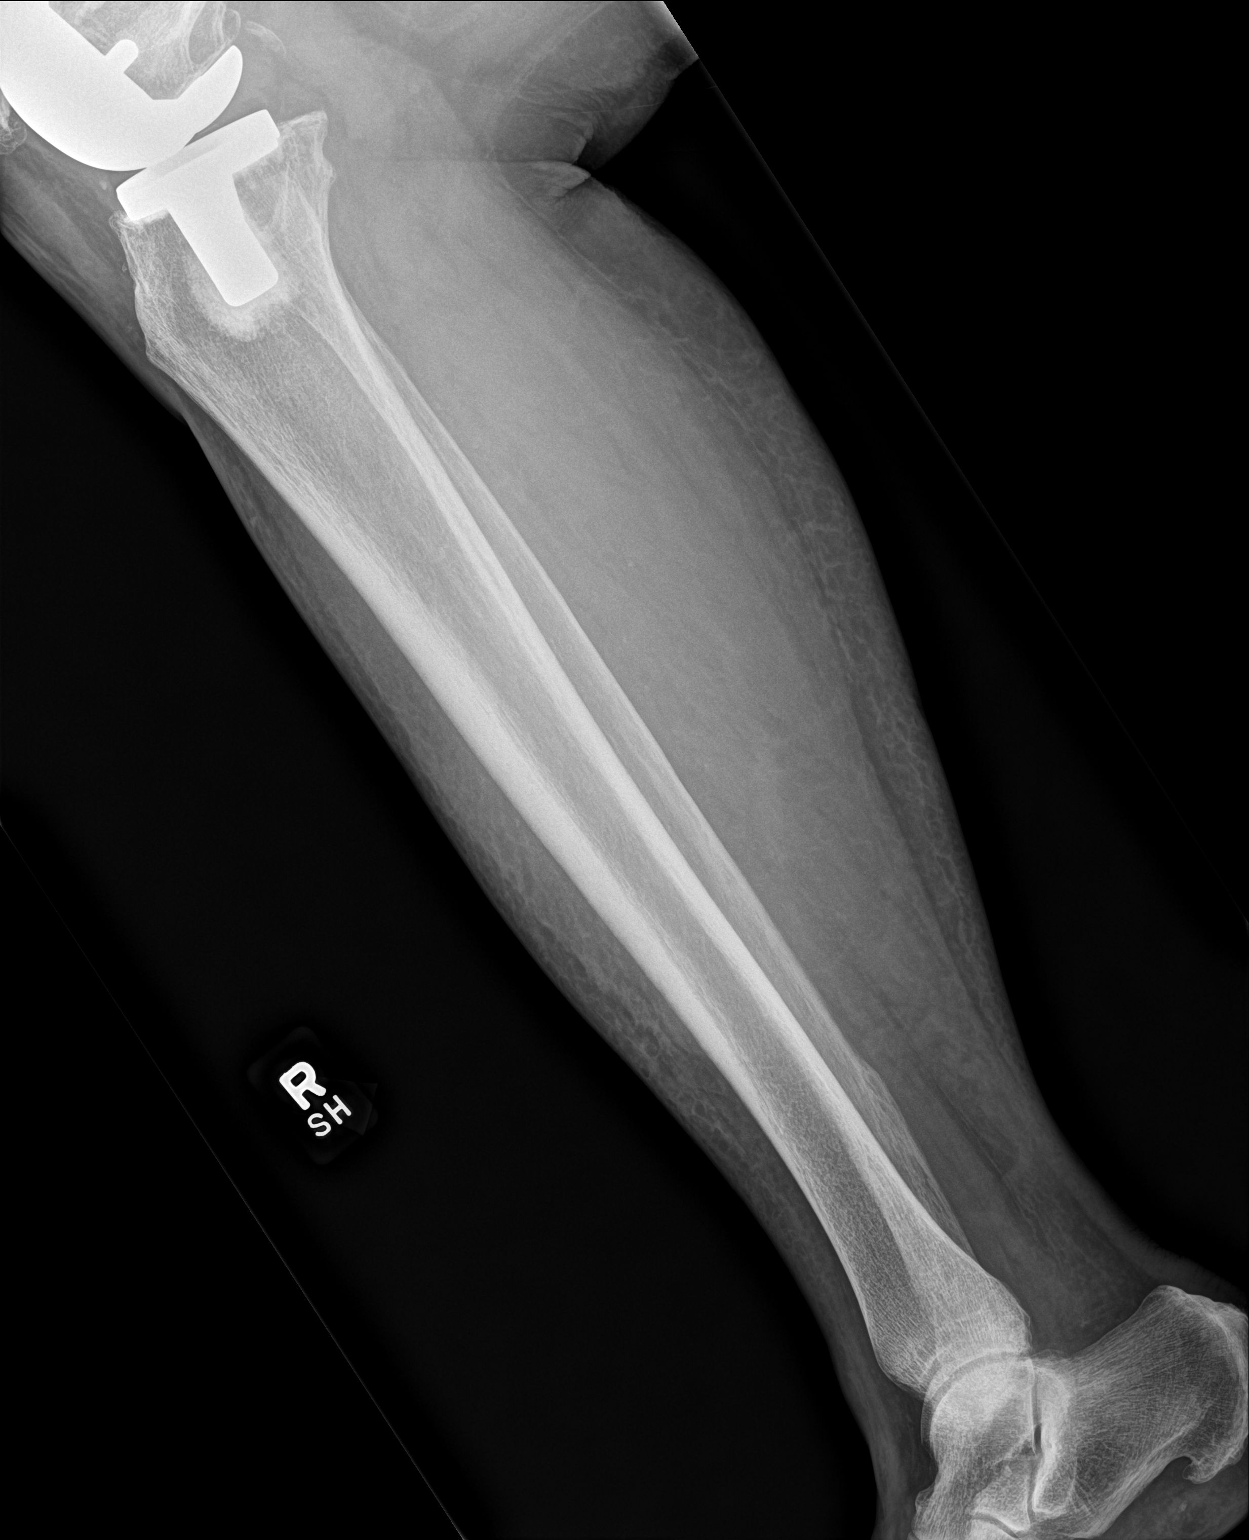

[2 of 2 positions shown; findings below may reference images not displayed]

FINDINGS: There is no evidence of fracture or other focal bone lesions.
Visualized portion of the right knee orthopedic hardware appears
intact. There is prominent swelling in the lateral aspect of the
leg.
IMPRESSION: No acute osseous abnormality in the right tibia or fibula.

## 2023-03-22 IMAGING — DX DG KNEE COMPLETE 4+V*L*
1 series · 4 of 4 positions shown · non-contrast
Comparison: None.

CLINICAL DATA: Fall, left knee pain.  Patient heard a pop.

EXAM:
LEFT KNEE - COMPLETE 4+ VIEW

[Series 1: knee · 0.14mm/px · 4 of 4 slices shown]
[im 1/4]
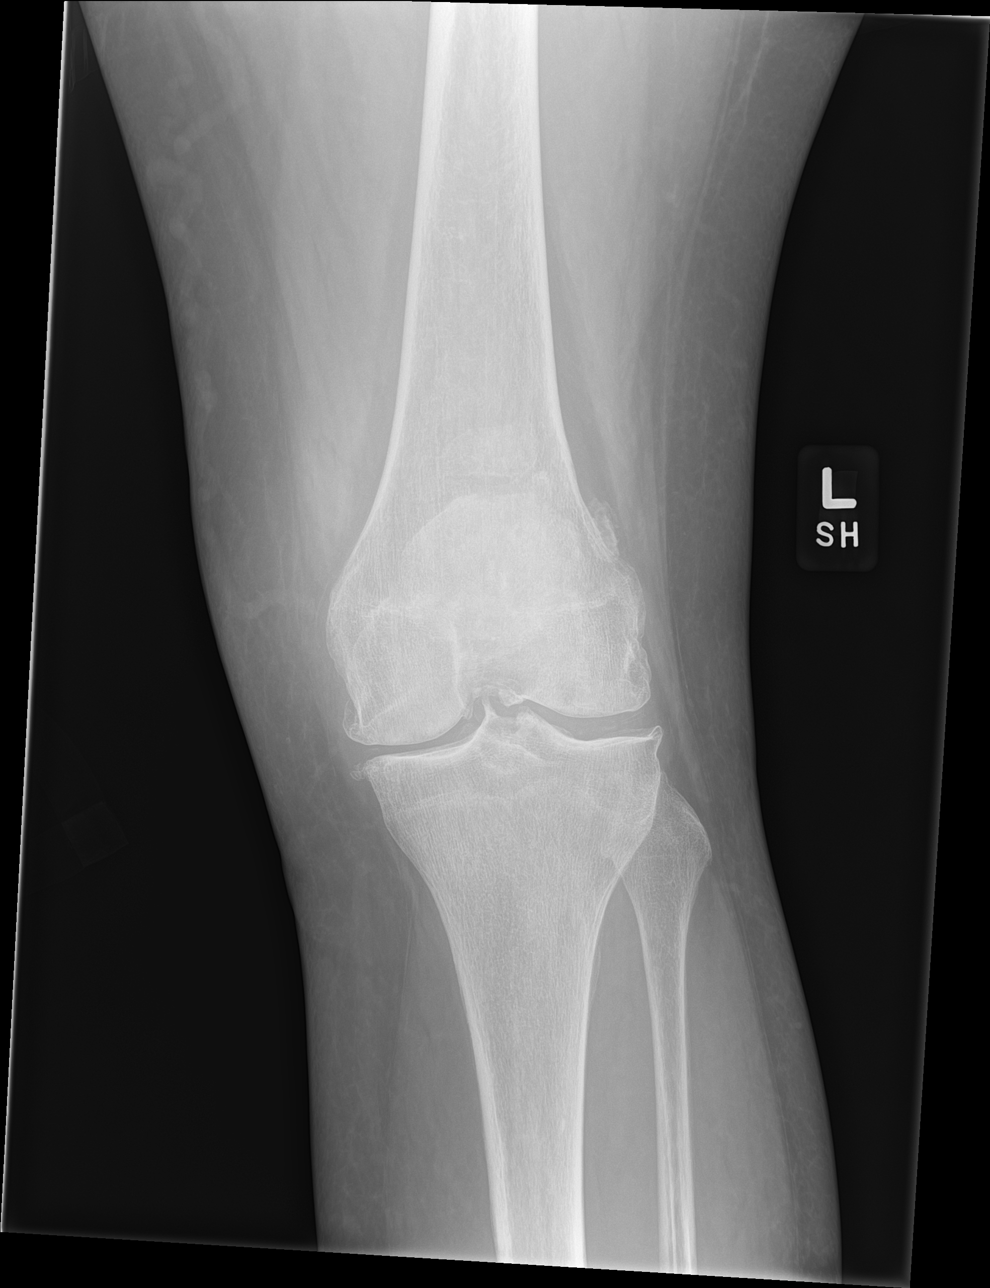
[im 2/4]
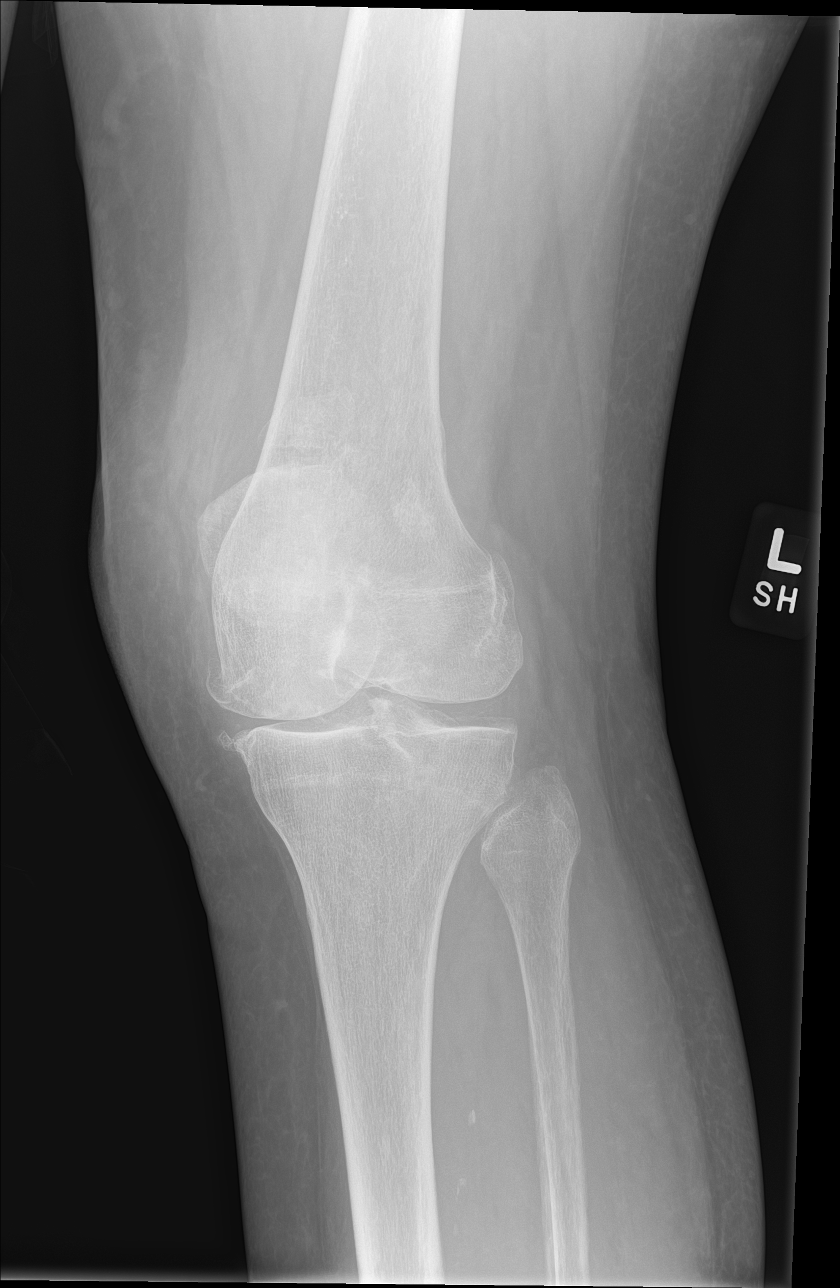
[im 3/4]
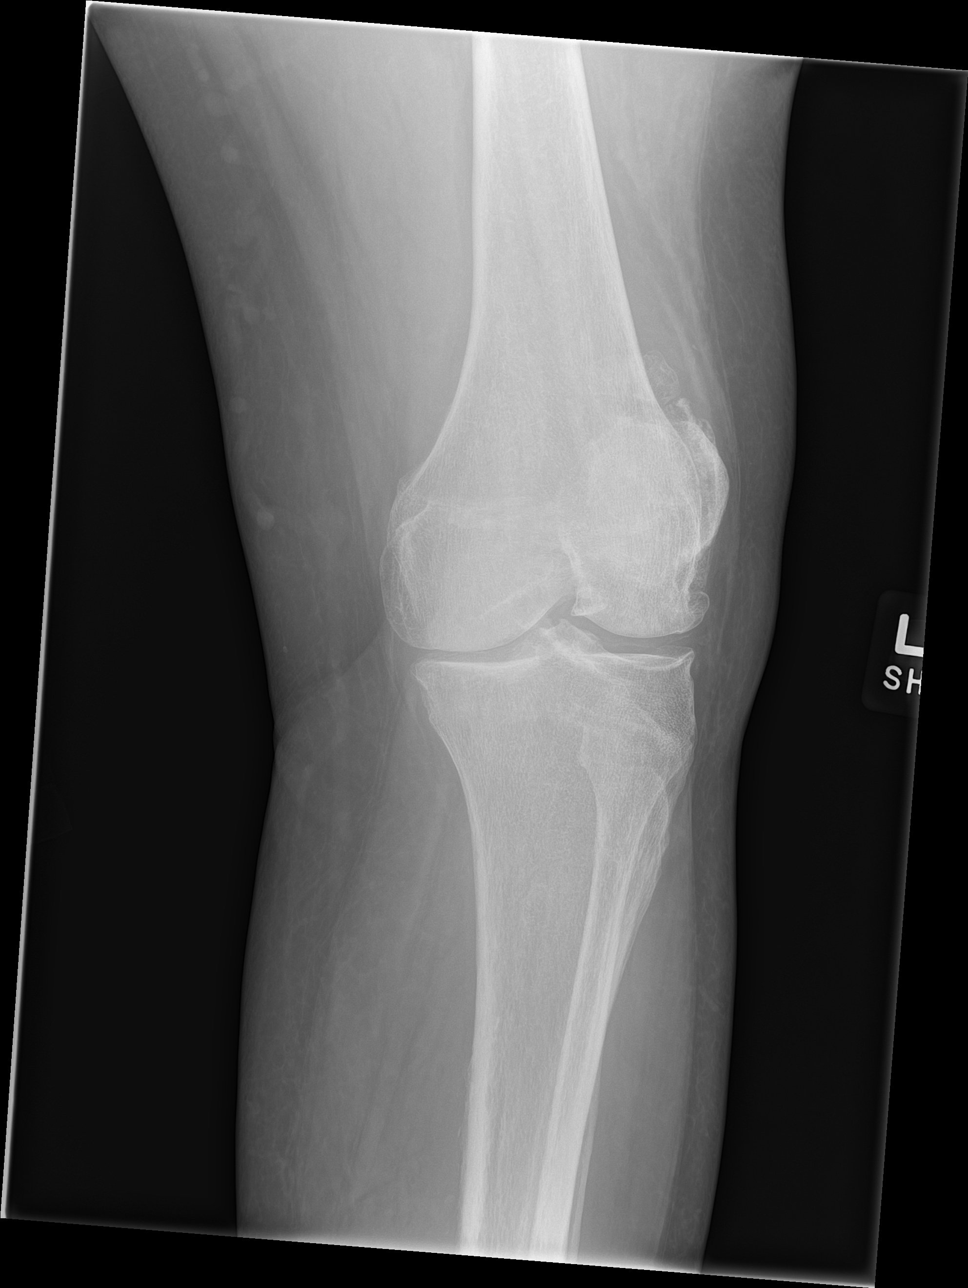
[im 4/4]
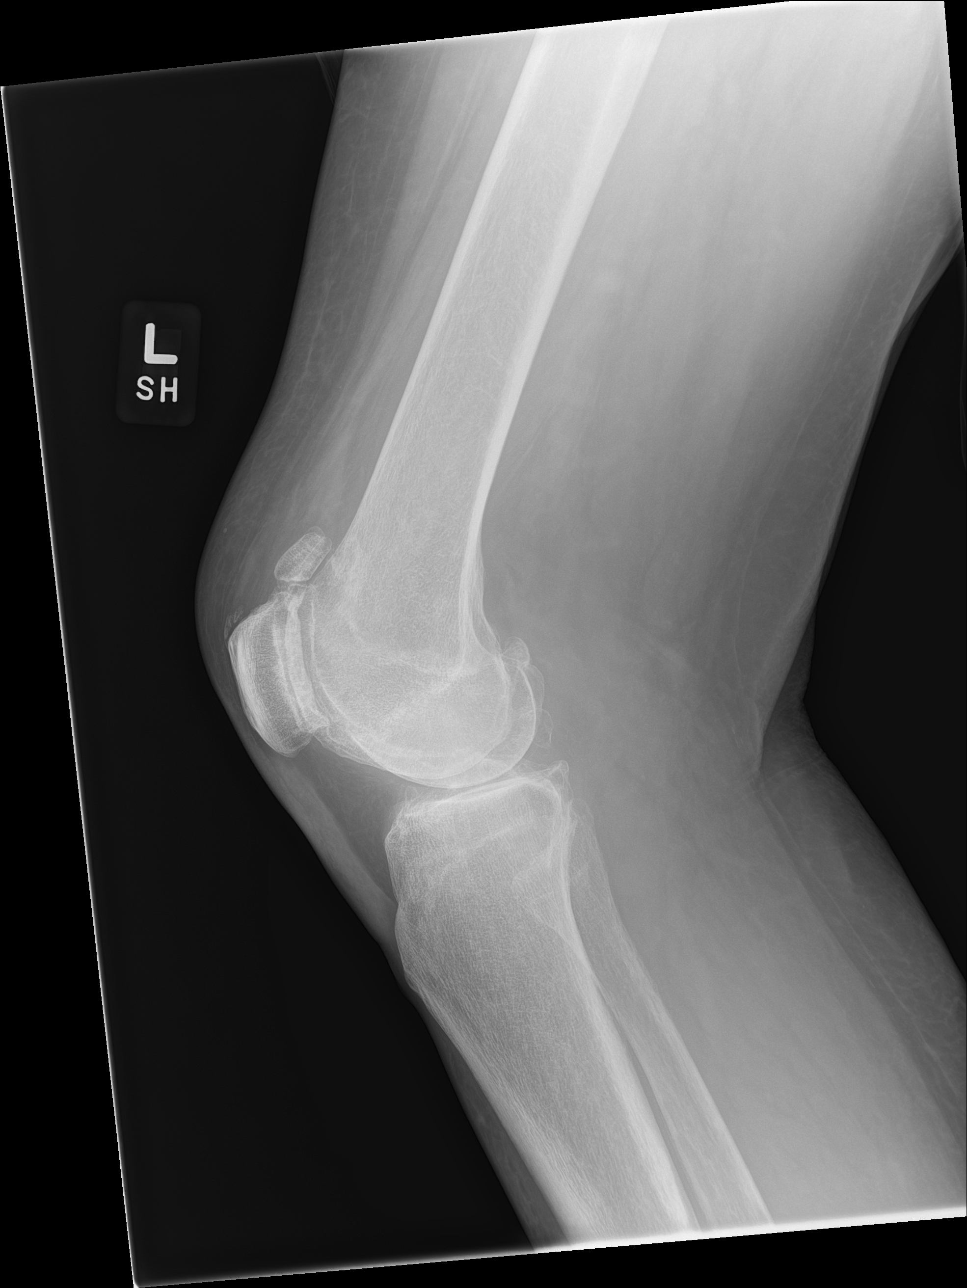

[4 of 4 positions shown; findings below may reference images not displayed]

FINDINGS: No evidence of fracture, dislocation, or joint effusion. There is
tricompartmental moderate joint space narrowing and osteophytes. No
focal bone lesion. Regional soft tissues are unremarkable.
IMPRESSION: 1. No acute findings in the left knee.
2. Moderate tricompartmental osteoarthritis.

## 2023-03-27 ENCOUNTER — Ambulatory Visit (INDEPENDENT_AMBULATORY_CARE_PROVIDER_SITE_OTHER): Payer: Medicare Other

## 2023-03-27 VITALS — Ht 67.5 in | Wt 198.0 lb

## 2023-03-27 DIAGNOSIS — Z0000 Encounter for general adult medical examination without abnormal findings: Secondary | ICD-10-CM

## 2023-03-27 NOTE — Progress Notes (Addendum)
 Subjective:   [name] is a 78 y.o. who presents for a Medicare Wellness preventive visit.  Visit Complete: Virtual I connected with  [name] on 03/27/23 by a audio enabled telemedicine application and verified that I am speaking with the correct person using two identifiers.  Patient Location: Home  Provider Location: Home Office  I discussed the limitations of evaluation and management by telemedicine. The patient expressed understanding and agreed to proceed.  Vital Signs: Because this visit was a virtual/telehealth visit, some criteria may be missing or patient reported. Any vitals not documented were not able to be obtained and vitals that have been documented are patient reported.  VideoDeclined- This patient declined Librarian, academic. Therefore the visit was completed with audio only.  AWV Questionnaire: No: Patient Medicare AWV questionnaire was not completed prior to this visit.  Cardiac Risk Factors include: advanced age (>7men, >62 women);obesity (BMI >30kg/m2)     Objective:    Today's Vitals   03/27/23 1141  Weight: 198 lb (89.8 kg)  Height: 5' 7.5" (1.715 m)   Body mass index is 30.55 kg/m.     03/27/2023   11:46 AM 01/10/2023    1:29 PM 03/22/2022    9:37 AM 07/27/2021    8:51 AM 03/07/2021    9:36 AM 01/17/2021    1:00 AM 12/31/2020    4:08 PM  Advanced Directives  Does Patient Have a Medical Advance Directive? Yes Yes Yes Yes Yes No Yes  Type of Estate agent of [place];Living will Healthcare Power of [place];Living will Healthcare Power of [place];Living will Living will Healthcare Power of [place];Living will  Living will;Healthcare Power of Attorney  Copy of Healthcare Power of Attorney in Chart? No - copy requested  No - copy requested  No - copy requested    Would patient like information on creating a medical advance directive?    No - Patient declined       Current Medications  (verified) Outpatient Encounter Medications as of 03/27/2023  Medication Sig   acetaminophen (TYLENOL) 650 MG CR tablet Take 650 mg by mouth every 8 (eight) hours as needed for pain.   dorzolamide-timolol (COSOPT) 22.3-6.8 MG/ML ophthalmic solution 1 drop 2 (two) times daily.   DULoxetine (CYMBALTA) 20 MG capsule TAKE 1 CAPSULE BY MOUTH DAILY   levocetirizine (XYZAL) 5 MG tablet TAKE 1 TABLET BY MOUTH EVERY DAY IN THE EVENING   meloxicam (MOBIC) 7.5 MG tablet Take 1 tablet (7.5 mg total) by mouth daily. for pain   NON FORMULARY Evenity Infusion once yearly.   pramoxine (SARNA SENSITIVE) 1 % LOTN 1 application to affected area as needed Externally Three times a day   SYSTANE ULTRA 0.4-0.3 % SOLN    triamcinolone cream (KENALOG) 0.1 % Apply 1 application topically 2 (two) times daily.   clobetasol (TEMOVATE) 0.05 % external solution Apply 1 Application topically 2 (two) times daily as needed. (Patient not taking: Reported on 03/27/2023)   No facility-administered encounter medications on file as of 03/27/2023.    Allergies (verified) Patient has no known allergies.   History: Past Medical History:  Diagnosis Date   Arthritis    Psoriasis    Intermittent; all over body and waxes and wanes   Tinnitus of right ear 2013   Intermittent; Melatonin has helped   Vaginal delivery 1973, 1981   Past Surgical History:  Procedure Laterality Date   goiter  1968   was on her Thyroid   IR KYPHO  THORACIC WITH BONE BIOPSY  01/20/2019   JOINT REPLACEMENT     Knee replacement Right 2019   TOTAL HIP ARTHROPLASTY     Lt 2002, Rt 2005   VEIN LIGATION AND STRIPPING Right 2005   Family History  Problem Relation Age of Onset   Dementia Mother    Heart disease Father        CABG x 5   Psoriasis Father    Psoriasis Sister    Osteoarthritis Sister    Pneumonia Maternal Grandfather    Heart attack Paternal Grandmother    Social History   Socioeconomic History   Marital status: Married    Spouse  name: Not on file   Number of children: Not on file   Years of education: Not on file   Highest education level: Not on file  Occupational History   Not on file  Tobacco Use   Smoking status: Never    Passive exposure: Never   Smokeless tobacco: Never  Vaping Use   Vaping status: Never Used  Substance and Sexual Activity   Alcohol use: Never   Drug use: Never   Sexual activity: Not Currently  Other Topics Concern   Not on file  Social History Narrative   Was a Runner, 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   Moved to area from [place], [place] to be closer to family    Social Drivers of Corporate investment banker Strain: Low Risk  (03/27/2023)   Overall Financial Resource Strain (CARDIA)    Difficulty of Paying Living Expenses: Not hard at all  Food Insecurity: No Food Insecurity (03/27/2023)   Hunger Vital Sign    Worried About Running Out of Food in the Last Year: Never true    Ran Out of Food in the Last Year: Never true  Transportation Needs: No Transportation Needs (03/27/2023)   PRAPARE - Administrator, Civil Service (Medical): No    Lack of Transportation (Non-Medical): No  Physical Activity: Inactive (03/27/2023)   Exercise Vital Sign    Days of Exercise per Week: 0 days    Minutes of Exercise per Session: 0 min  Stress: No Stress Concern Present (03/27/2023)   [employer] of Occupational Health - Occupational Stress Questionnaire    Feeling of Stress : Not at all  Social Connections: Socially Integrated (03/27/2023)   Social Connection and Isolation Panel [NHANES]    Frequency of Communication with Friends and Family: Three times a week    Frequency of Social Gatherings with Friends and Family: Three times a week    Attends Religious Services: More than 4 times per year    Active Member of Clubs or Organizations: Yes    Attends Banker Meetings: 1 to 4 times per year    Marital Status: Married     Tobacco Counseling Counseling given: Not Answered    Clinical Intake:  Pre-visit preparation completed: Yes  Pain : No/denies pain     BMI - recorded: 30.55 Nutritional Status: BMI > 30  Obese Nutritional Risks: None Diabetes: No  How often do you need to have someone help you when you read instructions, pamphlets, or other written materials from your doctor or pharmacy?: 1 - Never  Interpreter Needed?: No  Information entered by :: Lanier Ensign, LPN   Activities of Daily Living     03/27/2023   11:42 AM  In your present state of health, do you have any difficulty performing the following  activities:  Hearing? 1  Comment tinnitus hoh  Vision? 0  Difficulty concentrating or making decisions? 0  Walking or climbing stairs? 0  Dressing or bathing? 0  Doing errands, shopping? 0  Preparing Food and eating ? N  Using the Toilet? N  In the past six months, have you accidently leaked urine? N  Do you have problems with loss of bowel control? N  Managing your Medications? N  Managing your Finances? N  Housekeeping or managing your Housekeeping? N    Patient Care Team: [name], [place] as PCP - General (Physician Assistant) Stephannie Li, MD as Consulting Physician (Ophthalmology) Charlsie Merles Kirstie Peri, DPM as Consulting Physician (Podiatry)  Indicate any recent Medical Services you may have received from other than [facility] providers in the past year (date may be approximate).     Assessment:   This is a routine wellness examination for [place].  Hearing/Vision screen Hearing Screening - Comments:: Pt stated HoH and tinnitus  Vision Screening - Comments:: Pt follows up with Dr Allyne Gee for annual eye exams    Goals Addressed             This Visit's Progress    Patient Stated       Aim for 30 minutes of exercise or brisk walking, 6-8 glasses of water, and 5 servings of fruits and vegetables each day.        Depression Screen     03/27/2023   11:45 AM  01/10/2023    1:29 PM 09/19/2022   10:48 AM 03/22/2022    9:36 AM 01/29/2022   11:44 AM 03/07/2021    9:35 AM 04/09/2019   11:59 AM  PHQ 2/9 Scores  PHQ - 2 Score 0 0 0 0 1 0 0  PHQ- 9 Score   3 0 9      Fall Risk     03/27/2023   11:47 AM 01/10/2023    1:29 PM 09/19/2022   10:50 AM 03/22/2022    9:38 AM 03/07/2021    9:37 AM  Fall Risk   Falls in the past year? 1 1 0 0 1  Number falls in past yr: 1 1 0 0 1  Injury with Fall? 1 0 0 0 1  Comment bruised    bruised  Risk for fall due to : History of fall(s);Impaired balance/gait Impaired balance/gait  Impaired vision;Impaired balance/gait Impaired vision  Follow up Falls prevention discussed Falls evaluation completed Falls evaluation completed Falls prevention discussed Falls prevention discussed    MEDICARE RISK AT HOME:  Medicare Risk at Home Any stairs in or around the home?: Yes If so, are there any without handrails?: No Home free of loose throw rugs in walkways, pet beds, electrical cords, etc?: Yes Adequate lighting in your home to reduce risk of falls?: Yes Life alert?: No Use of a cane, walker or w/c?: No Grab bars in the bathroom?: Yes Shower chair or bench in shower?: Yes Elevated toilet seat or a handicapped toilet?: No  TIMED UP AND GO:  Was the test performed?  No  Cognitive Function: 6CIT completed        03/27/2023   11:50 AM 03/22/2022    9:40 AM 03/07/2021    9:41 AM 04/09/2019   11:59 AM  6CIT Screen  What Year? 0 points 0 points 0 points 0 points  What month? 0 points 0 points 0 points 0 points  What time? 0 points 0 points 0 points 0 points  Count back from  20 0 points 0 points 0 points 0 points  Months in reverse 0 points 0 points 0 points 0 points  Repeat phrase 0 points 0 points 4 points 0 points  Total Score 0 points 0 points 4 points 0 points    Immunizations Immunization History  Administered Date(s) Administered   Fluad Quad(high Dose 65+) 10/28/2019   Influenza, High Dose Seasonal PF  11/29/2021   Influenza-Unspecified 11/29/2021   PNEUMOCOCCAL CONJUGATE-20 07/12/2021   Tdap 07/12/2021   Zoster Recombinant(Shingrix) 02/06/2019, 04/25/2019    Screening Tests Health Maintenance  Topic Date Due   Hepatitis C Screening  Never done   INFLUENZA VACCINE  04/15/2023 (Originally 08/16/2022)   COVID-19 Vaccine (1) 09/19/2023 (Originally 06/18/1950)   Medicare Annual Wellness (AWV)  03/26/2024   DTaP/Tdap/Td (2 - Td or Tdap) 07/13/2031   Pneumonia Vaccine 52+ Years old  Completed   DEXA SCAN  Completed   Zoster Vaccines- Shingrix  Completed   HPV VACCINES  Aged Out    Health Maintenance  Health Maintenance Due  Topic Date Due   Hepatitis C Screening  Never done   Health Maintenance Items Addressed: See Nurse Notes  Additional Screening:  Vision Screening: Recommended annual ophthalmology exams for early detection of glaucoma and other disorders of the eye.  Dental Screening: Recommended annual dental exams for proper oral hygiene  Community Resource Referral / Chronic Care Management: CRR required this visit?  No   CCM required this visit?  No     Plan:     I have personally reviewed and noted the following in the patient's chart:   Medical and social history Use of alcohol, tobacco or illicit drugs  Current medications and supplements including opioid prescriptions. Patient is not currently taking opioid prescriptions. Functional ability and status Nutritional status Physical activity Advanced directives List of other physicians Hospitalizations, surgeries, and ER visits in previous 12 months Vitals Screenings to include cognitive, depression, and falls Referrals and appointments  In addition, I have reviewed and discussed with patient certain preventive protocols, quality metrics, and best practice recommendations. A written personalized care plan for preventive services as well as general preventive health recommendations were provided to  patient.     Marzella Schlein, LPN   [id number]   After Visit Summary: (MyChart) Due to this being a telephonic visit, the after visit summary with patients personalized plan was offered to patient via MyChart   Notes: Nothing significant to report at this time.

## 2023-03-27 NOTE — Patient Instructions (Signed)
 Ms. Smedley , Thank you for taking time to come for your Medicare Wellness Visit. I appreciate your ongoing commitment to your health goals. Please review the following plan we discussed and let me know if I can assist you in the future.   Referrals/Orders/Follow-Ups/Clinician Recommendations: Aim for 30 minutes of exercise or brisk walking, 6-8 glasses of water, and 5 servings of fruits and vegetables each day.   This is a list of the screening recommended for you and due dates:  Health Maintenance  Topic Date Due   Hepatitis C Screening  Never done   Flu Shot  04/15/2023*   COVID-19 Vaccine (1) 09/19/2023*   Medicare Annual Wellness Visit  03/26/2024   DTaP/Tdap/Td vaccine (2 - Td or Tdap) 07/13/2031   Pneumonia Vaccine  Completed   DEXA scan (bone density measurement)  Completed   Zoster (Shingles) Vaccine  Completed   HPV Vaccine  Aged Out  *Topic was postponed. The date shown is not the original due date.    Advanced directives: (Copy Requested) Please bring a copy of your health care power of attorney and living will to the office to be added to your chart at your convenience. You can mail to [facility] [address] [place], [place] [address] or email to [email or web address]  Next Medicare Annual Wellness Visit scheduled for next year: Yes

## 2023-05-02 NOTE — Progress Notes (Unsigned)
 Office Visit Note  Patient: [name]             Date of Birth: [birth year]           MRN: [id number]             PCP: Alexander Iba, PA Referring: Alexander Iba, PA Visit Date: 05/13/2023   Subjective:  Other (Patient reports that symptoms are stable since the last visit, cymbalta  has improved her sleeping. )   History of Present Illness: Liora Amrine is a 78 y.o. female here for follow up for joint pain and skin rashes with positive ANA.    Previous HPI 02/13/2023 Sloane Huddleston is a 78 y.o. female here for follow up for joint pain and skin rashes with positive ANA.  She has been experiencing pain in her feet, particularly in her toes, which become red, and sometimes the soles of her feet are affected. This has been ongoing for a couple of years. The pain can be severe enough to throw her off balance, and she experiences shooting pains and stiffness in her feet. She has been using meloxicam , which helps somewhat, but she notices increased pain if she delays taking it.   An ultrasound performed by a vascular specialist revealed valve problems. She was advised to elevate her legs more frequently and wear support stockings, which help with swelling but cause discomfort after prolonged use. She finds it challenging to elevate her legs as recommended.   She has been on Reclast  infusions for bone density for the past three years, following a year of Evenity  injections. She associates the onset of her symptoms with these treatments, particularly noting swelling and pain in her feet and hands. She is part of a study group for these medications but has not heard of others experiencing similar symptoms.   She has a history of being treated for psoriasis with creams and lotions, although her dermatologist has stated she does not have psoriasis. She has experienced hair loss and issues with her scalp, which have not improved. Her legs sometimes swell, and she has a history of a wound on one  leg.   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 She reports that her toes were shortened, but she feels they may be growing again. She experiences pain in her knees and has been advised to consider knee replacement surgery.      Previous HPI 08/13/2022 Avery Gingras is a 78 y.o. female here for follow up for joint pain and skin rashes with positive ANA.  Since her last visit she is overall doing fairly well he is taking the meloxicam  7.5 mg once every day when interrupting this she had an increase in joint pain and stiffness after about 3 days.  But tolerating this without any particular side effects.  She still having issues with getting discomfort swelling and redness affecting her feet usually at that end of the day and overnight.  Pain in her hands is most often at the thumbs or at the distal joint of the small fingers does not see this similar type of swelling or discoloration here.   Previous HPI 04/06/2022 Merva Miracle is a 78 y.o. female here for follow up for joint pain and skin rashes with positive ANA.  Lab results from initial visit were negative for specific antibody markers there was a mildly elevated C-reactive protein of 12.3.  X-ray of bilateral hands was consistent with  osteoarthritis with no inflammatory disease changes.  Since her last visit she continues having similar symptoms with joint pains particularly notices issues in her neck and back and shoulders as well as both hands and both feet.  She has not had as many episodes of toe redness swelling and painful sensation but still occur sometimes.  She has been using the topical triamcinolone on skin rashes which is helpful but does not use this very regularly because it causes some skin burning and irritation.   Previous HPI 02/23/22 Burton Roepke is a 78 y.o. female here for evaluation of joint pain in multiple areas with positive ANA and patient  has history of psoriasis. Apparently recent dermatology evaluation not as certain about psoriasis as her diagnosis but has been present chronically waxing and waning in the past. 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 Gilberto Labella. She has been treated with evenity  for osteopenia. She has longstanding history of psoriasis typically controlled with topical medications and has never been on long-term systemic medication just occasional short-term prednisone for exacerbation.  She moved from [place]  about 3 years ago to be closer to family after retiring.  She experienced major flareup of skin disease especially bad areas on her scalp and lower extremities and completed a course of topical steroids did not afford the other prescribed medicine. She has neuropathy affecting distal foot and toes but with increased redness and pain in that area. She was concerned for possible gout due to acute exacerbation but uric acid level was 5.4.  She has longstanding history of abnormal labs raising concern for lupus but has never been diagnosed this goes back several decades.   Labs reviewed ANA 1:320 DFS RF neg CCP neg ESR 28 Uric acid 5.4   Review of Systems  Constitutional:  Positive for fatigue.  HENT:  Positive for mouth sores and mouth dryness.   Eyes:  Negative for dryness.  Respiratory:  Negative for shortness of breath.   Cardiovascular:  Negative for chest pain and palpitations.  Gastrointestinal:  Negative for blood in stool, constipation and diarrhea.  Endocrine: Negative for increased urination.  Genitourinary:  Negative for involuntary urination.  Musculoskeletal:  Positive for joint pain, gait problem, joint pain, myalgias, muscle weakness, muscle tenderness and myalgias. Negative for joint swelling and morning stiffness.  Skin:  Positive for rash, hair loss, redness and sensitivity to sunlight. Negative for color change.   Allergic/Immunologic: Negative for susceptible to infections.  Neurological:  Negative for dizziness and headaches.  Hematological:  Negative for swollen glands.  Psychiatric/Behavioral:  Negative for depressed mood and sleep disturbance. The patient is not nervous/anxious.     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   Peripheral edema 08/13/2022   Pain in right shoulder 04/06/2022   Bilateral hand pain 02/23/2022   Positive ANA (antinuclear antibody) 02/23/2022   Hallux valgus, acquired 07/01/2019   Abnormal EKG 01/20/2019   Tinnitus of right ear    Psoriasis    Generalized osteoarthritis of multiple sites    Osteopenia 01/05/2019    Past Medical History:  Diagnosis Date   Arthritis    Psoriasis    Intermittent; all over body and waxes and wanes   Tinnitus of right ear 2013   Intermittent; Melatonin has helped   Vaginal delivery 1973, 1981    Family History  Problem Relation Age of Onset  Dementia Mother    Heart disease Father        CABG x 5   Psoriasis Father    Psoriasis Sister    Osteoarthritis Sister    Pneumonia Maternal Grandfather    Heart attack Paternal Grandmother    Past Surgical History:  Procedure Laterality Date   goiter  1968   was on her Thyroid   IR KYPHO THORACIC WITH BONE BIOPSY  01/20/2019   JOINT REPLACEMENT     Knee replacement Right 2019   TOTAL HIP ARTHROPLASTY     Lt 2002, Rt 2005   VEIN LIGATION AND STRIPPING Right 2005   Social History   Social History Narrative   Was a Runner, 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   Moved to area from [place], [place]  to be closer to family    Immunization History  Administered Date(s) Administered   Fluad Quad(high Dose 65+) 10/28/2019   Influenza, High Dose Seasonal PF 11/29/2021   Influenza-Unspecified 11/29/2021   PNEUMOCOCCAL CONJUGATE-20 07/12/2021   Tdap  07/12/2021   Zoster Recombinant(Shingrix) 02/06/2019, 04/25/2019     Objective: Vital Signs: BP 116/75 (BP Location: Right Arm, Patient Position: Sitting, Cuff Size: Normal)   Pulse 74   Resp 15   Ht 5\' 7"  (1.702 m)   Wt 192 lb 3.2 oz (87.2 kg)   BMI 30.10 kg/m    Physical Exam   Musculoskeletal Exam: ***  CDAI Exam: CDAI Score: -- Patient Global: --; Provider Global: -- Swollen: --; Tender: -- Joint Exam 05/13/2023   No joint exam has been documented for this visit   There is currently no information documented on the homunculus. Go to the Rheumatology activity and complete the homunculus joint exam.  Investigation: No additional findings.  Imaging: No results found.  Recent Labs: Lab Results  Component Value Date   WBC 6.3 08/13/2022   HGB 14.1 08/13/2022   PLT 311 08/13/2022   NA 144 08/13/2022   K 4.1 08/13/2022   CL 106 08/13/2022   CO2 30 08/13/2022   GLUCOSE 121 (H) 08/13/2022   BUN 20 08/13/2022   CREATININE 0.86 08/13/2022   CALCIUM 9.6 08/13/2022   GFRAA >60 01/20/2019    Speciality Comments: No specialty comments available.  Procedures:  No procedures performed Allergies: Patient has no known allergies.   Assessment / Plan:     Visit Diagnoses: Generalized osteoarthritis of multiple sites - Duloxetine  (Cymbalta ) 20mg  daily for OA/neuropathic pain.  High risk medication use - Meloxicam  7.5 mg daily  Psoriasis - clobetasol  (TEMOVATE ) 0.05 % external solution  Chronic venous insufficiency  ***  Orders: No orders of the defined types were placed in this encounter.  No orders of the defined types were placed in this encounter.    Follow-Up Instructions: No follow-ups on file.   Matt Song, MD  Note - This record has been created using [employer].  Chart creation errors have been sought, but may not always  have been located. Such creation errors do not reflect on  the standard of medical care.

## 2023-05-13 ENCOUNTER — Encounter: Payer: Self-pay | Admitting: Internal Medicine

## 2023-05-13 ENCOUNTER — Ambulatory Visit: Payer: Medicare Other | Attending: Internal Medicine | Admitting: Internal Medicine

## 2023-05-13 VITALS — BP 116/75 | HR 74 | Resp 15 | Ht 67.0 in | Wt 192.2 lb

## 2023-05-13 DIAGNOSIS — R21 Rash and other nonspecific skin eruption: Secondary | ICD-10-CM | POA: Insufficient documentation

## 2023-05-13 DIAGNOSIS — Z79899 Other long term (current) drug therapy: Secondary | ICD-10-CM | POA: Diagnosis present

## 2023-05-13 DIAGNOSIS — L409 Psoriasis, unspecified: Secondary | ICD-10-CM | POA: Insufficient documentation

## 2023-05-13 DIAGNOSIS — I872 Venous insufficiency (chronic) (peripheral): Secondary | ICD-10-CM | POA: Insufficient documentation

## 2023-05-13 DIAGNOSIS — R768 Other specified abnormal immunological findings in serum: Secondary | ICD-10-CM | POA: Insufficient documentation

## 2023-05-13 DIAGNOSIS — M79672 Pain in left foot: Secondary | ICD-10-CM | POA: Insufficient documentation

## 2023-05-13 DIAGNOSIS — M159 Polyosteoarthritis, unspecified: Secondary | ICD-10-CM | POA: Diagnosis not present

## 2023-05-13 DIAGNOSIS — M79671 Pain in right foot: Secondary | ICD-10-CM | POA: Diagnosis present

## 2023-05-13 MED ORDER — DULOXETINE HCL 20 MG PO CPEP: 20.0000 mg | ORAL_CAPSULE | Freq: Every day | ORAL | 1 refills | Status: DC

## 2023-05-13 MED ORDER — MELOXICAM 7.5 MG PO TABS: 7.5000 mg | ORAL_TABLET | Freq: Every day | ORAL | 1 refills | Status: DC

## 2023-05-13 MED ORDER — TRIAMCINOLONE ACETONIDE 0.1 % EX CREA
1.0000 | TOPICAL_CREAM | CUTANEOUS | 0 refills | Status: DC | PRN
Start: 2023-05-13 — End: 2023-11-12

## 2023-07-13 ENCOUNTER — Encounter (HOSPITAL_COMMUNITY): Payer: Self-pay | Admitting: Interventional Radiology

## 2023-08-12 ENCOUNTER — Ambulatory Visit: Payer: Self-pay

## 2023-08-12 NOTE — Telephone Encounter (Signed)
 FYI Only or Action Required?: FYI only for provider.  Patient was last seen in primary care on 09/19/2022 by Job Lukes, PA.  Called Nurse Triage reporting Sinusitis, Nasal Congestion, Otalgia, and Fever.  Symptoms began several days ago.  Interventions attempted: OTC medications: Muccinex, acetaminophen , OTC allergy pill.  Symptoms are: right ear pain radiates to teeth and right jaw, nasal congestion, sinus pain/headachegradually worsening.  Triage Disposition: See Physician Within 24 Hours- patient going to urgent care  Patient/caregiver understands and will follow disposition?: Yes                 Copied from CRM [id number]. Topic: Clinical - Red Word Triage >> Aug 12, 2023  2:58 PM Suzen RAMAN wrote: Red Word that prompted transfer to Nurse Triage: possible sinus infection, ear,teeth pain and congestion Reason for Disposition  Earache  Answer Assessment - Initial Assessment Questions 1. LOCATION: Where does it hurt?      Right ear into jaw/teeth; dull headache yesterday.  2. ONSET: When did the sinus pain start?  (e.g., hours, days)      Friday started with the right ear pain.  3. SEVERITY: How bad is the pain?   (Scale 0-10; or none, mild, moderate or severe)    7-8 /10.  4. RECURRENT SYMPTOM: Have you ever had sinus problems before? If Yes, ask: When was the last time? and What happened that time?      Yes, she states she thinks it has been over 4 years. She thinks she is sensitive to the blooming tree they have and states she was outside this weekend.  5. NASAL CONGESTION: Is the nose blocked? If Yes, ask: Can you open it or must you breathe through your mouth?     Yes, she states it stays congested.  6. NASAL DISCHARGE: Do you have discharge from your nose? If so ask, What color?     No.  7. FEVER: Do you have a fever? If Yes, ask: What is it, how was it measured, and when did it start?      Patient states she had a fever but  did not check her temperature.  8. OTHER SYMPTOMS: Do you have any other symptoms? (e.g., sore throat, cough, earache, difficulty breathing)     Saturday night and yesterday she states she felt feverish but denies chills. Patient denies chest pain, SOB.  9. PREGNANCY: Is there any chance you are pregnant? When was your last menstrual period?     N/A.  Protocols used: Sinus Pain or Congestion-A-AH

## 2023-08-12 NOTE — Telephone Encounter (Signed)
 Noted

## 2023-08-15 ENCOUNTER — Ambulatory Visit (INDEPENDENT_AMBULATORY_CARE_PROVIDER_SITE_OTHER): Admitting: Physician Assistant

## 2023-08-15 ENCOUNTER — Encounter: Payer: Self-pay | Admitting: Physician Assistant

## 2023-08-15 VITALS — BP 110/80 | HR 66 | Temp 97.2°F | Ht 67.0 in | Wt 187.0 lb

## 2023-08-15 DIAGNOSIS — J019 Acute sinusitis, unspecified: Secondary | ICD-10-CM

## 2023-08-15 DIAGNOSIS — R051 Acute cough: Secondary | ICD-10-CM

## 2023-08-15 DIAGNOSIS — L570 Actinic keratosis: Secondary | ICD-10-CM | POA: Insufficient documentation

## 2023-08-15 MED ORDER — AMOXICILLIN-POT CLAVULANATE 875-125 MG PO TABS: 1.0000 | ORAL_TABLET | Freq: Two times a day (BID) | ORAL | 0 refills | Status: AC

## 2023-08-15 MED ORDER — LEVOCETIRIZINE DIHYDROCHLORIDE 5 MG PO TABS: 5.0000 mg | ORAL_TABLET | Freq: Every evening | ORAL | 1 refills | Status: DC

## 2023-08-15 MED ORDER — BENZONATATE 100 MG PO CAPS
100.0000 mg | ORAL_CAPSULE | Freq: Three times a day (TID) | ORAL | 0 refills | Status: AC | PRN
Start: 2023-08-15 — End: ?

## 2023-08-15 NOTE — Progress Notes (Signed)
 [name] is a 78 y.o. female here for a new problem.  History of Present Illness:   Chief Complaint  Patient presents with   Cough    Pt c/o dry cough off and on all Spring, fever on Sunday, did not take temp, felt warm. Also having sinus pressure, Thursday had right ear ache and jaw pain, is getting better, teeth still hurt and sensitive to cold.    HPI  Discussed the use of AI scribe software for clinical note transcription with the patient, who gave verbal consent to proceed.  History of Present Illness [name] is a 78 year old female with a history of sinus disease who presents with symptoms suggestive of a sinus infection. She is accompanied by her husband, Norleen.  Upper respiratory symptoms - Intermittent subjective fever since Sunday, not measured but felt - Body aches coinciding with fever - Nasal congestion, initially clear nasal discharge over the weekend, now more congested - Frequent daytime cough with phlegm production - Able to sleep through the night without coughing last night  Otolaryngologic symptoms - Right earache at onset of illness - Tinnitus in right ear - Jaw and bilateral dental pain, believed to originate from right ear - History of recurrent and severe sinus disease  Sleep disturbance and fatigue - Sleeping until noon, which is unusual compared to typical six hours of sleep - Significant fatigue, including sleeping for seven hours on Sunday afternoon after church  Medication use - Currently using Mucinex and an antihistamine for symptom management - Recently without prescription for Xyzal   Negative review of systems - No shortness of breath - No diarrhea - No recent travel    Past Medical History:  Diagnosis Date   Arthritis    Psoriasis    Intermittent; all over body and waxes and wanes   Tinnitus of right ear 2013   Intermittent; Melatonin has helped   Vaginal delivery 1973, 1981     Social History   Tobacco Use   Smoking  status: Never    Passive exposure: Never   Smokeless tobacco: Never  Vaping Use   Vaping status: Never Used  Substance Use Topics   Alcohol use: Never   Drug use: Never    Past Surgical History:  Procedure Laterality Date   goiter  1968   was on her Thyroid   IR KYPHO THORACIC WITH BONE BIOPSY  01/20/2019   JOINT REPLACEMENT     Knee replacement Right 2019   TOTAL HIP ARTHROPLASTY     Lt 2002, Rt 2005   VEIN LIGATION AND STRIPPING Right 2005    Family History  Problem Relation Age of Onset   Dementia Mother    Heart disease Father        CABG x 5   Psoriasis Father    Psoriasis Sister    Osteoarthritis Sister    Pneumonia Maternal Grandfather    Heart attack Paternal Grandmother     No Known Allergies  Current Medications:   Current Outpatient Medications:    acetaminophen  (TYLENOL ) 650 MG CR tablet, Take 1,300 mg by mouth every morning., Disp: , Rfl:    amoxicillin -clavulanate (AUGMENTIN ) 875-125 MG tablet, Take 1 tablet by mouth 2 (two) times daily., Disp: 14 tablet, Rfl: 0   Ascorbic Acid (VITAMIN C PO), Take by mouth daily., Disp: , Rfl:    benzonatate  (TESSALON  PERLES) 100 MG capsule, Take 1 capsule (100 mg total) by mouth 3 (three) times daily as needed., Disp: 30 capsule, Rfl:  0   Calcium-Magnesium-Zinc (CAL-MAG-ZINC PO), Take by mouth daily., Disp: , Rfl:    Cholecalciferol (VITAMIN D3 PO), Take by mouth daily., Disp: , Rfl:    clobetasol  (TEMOVATE ) 0.05 % external solution, Apply 1 Application topically 2 (two) times daily as needed., Disp: 50 mL, Rfl: 0   dorzolamide-timolol (COSOPT) 22.3-6.8 MG/ML ophthalmic solution, 1 drop 2 (two) times daily., Disp: , Rfl:    DULoxetine  (CYMBALTA ) 20 MG capsule, Take 1 capsule (20 mg total) by mouth daily., Disp: 90 capsule, Rfl: 1   levocetirizine (XYZAL ) 5 MG tablet, Take 1 tablet (5 mg total) by mouth every evening., Disp: 90 tablet, Rfl: 1   meloxicam  (MOBIC ) 7.5 MG tablet, Take 1 tablet (7.5 mg total) by mouth daily.  for pain, Disp: 90 tablet, Rfl: 1   NON FORMULARY, Evenity  Infusion once yearly., Disp: , Rfl:    pramoxine (CERAVE ITCH RELIEF) 1 % LOTN, Apply 1 Application topically 2 (two) times daily., Disp: , Rfl:    pramoxine (SARNA SENSITIVE) 1 % LOTN, 1 application to affected area as needed Externally Three times a day, Disp: , Rfl:    SYSTANE ULTRA 0.4-0.3 % SOLN, , Disp: , Rfl:    triamcinolone  cream (KENALOG ) 0.1 %, Apply 1 Application topically as needed., Disp: 454 g, Rfl: 0   Review of Systems:   Negative unless otherwise specified per HPI.  Vitals:   Vitals:   08/15/23 1017  BP: 110/80  Pulse: 66  Temp: (!) 97.2 F (36.2 C)  TempSrc: Temporal  SpO2: 95%  Weight: 187 lb (84.8 kg)  Height: 5' 7 (1.702 m)     Body mass index is 29.29 kg/m.  Physical Exam:   Physical Exam Vitals and nursing note reviewed.  Constitutional:      General: She is not in acute distress.    Appearance: She is well-developed. She is not ill-appearing or toxic-appearing.  HENT:     Head: Normocephalic and atraumatic.     Right Ear: Ear canal and external ear normal. A middle ear effusion is present. Tympanic membrane is not erythematous, retracted or bulging.     Left Ear: Ear canal and external ear normal. A middle ear effusion is present. Tympanic membrane is not erythematous, retracted or bulging.     Nose:     Right Sinus: Maxillary sinus tenderness and frontal sinus tenderness present.     Left Sinus: Maxillary sinus tenderness and frontal sinus tenderness present.     Mouth/Throat:     Pharynx: Uvula midline. No posterior oropharyngeal erythema.  Eyes:     General: Lids are normal.     Conjunctiva/sclera: Conjunctivae normal.  Neck:     Trachea: Trachea normal.  Cardiovascular:     Rate and Rhythm: Normal rate and regular rhythm.     Heart sounds: Normal heart sounds, S1 normal and S2 normal.  Pulmonary:     Effort: Pulmonary effort is normal.     Breath sounds: Normal breath sounds.  No decreased breath sounds, wheezing, rhonchi or rales.  Lymphadenopathy:     Cervical: No cervical adenopathy.  Skin:    General: Skin is warm and dry.  Neurological:     Mental Status: She is alert.  Psychiatric:        Speech: Speech normal.        Behavior: Behavior normal. Behavior is cooperative.     Assessment and Plan:    Assessment & Plan Acute sinusitis Suspected acute sinusitis with fever, right earache, tinnitus, jaw  pain, and nasal congestion. Symptoms persist, suggesting bacterial sinusitis. - Prescribed Augmentin  (amoxicillin /clavulanate). - Restarted Xyzal  (levocetirizine). - Consider chest x-ray if fever and cough persist; add second antibiotic if necessary.  Acute cough Acute cough likely related to sinusitis, more prominent during the day. - Prescribed Tessalon  Perles (benzonatate ).     Lucie Buttner, PA-C

## 2023-09-17 ENCOUNTER — Ambulatory Visit: Payer: Self-pay

## 2023-09-17 NOTE — Telephone Encounter (Signed)
 Noted

## 2023-09-17 NOTE — Telephone Encounter (Signed)
 Pt states that she went to [facility] this morning. Denies questions at this time.

## 2023-10-30 NOTE — Progress Notes (Signed)
 Office Visit Note  Patient: [name]             Date of Birth: 07/09/1945           MRN: [id number]             PCP: Job Lukes, PA Referring: Job Lukes, PA Visit Date: 11/12/2023   Subjective:  Medical Management of Chronic Issues (Patient states she has been using the triamcinolone  cream and found lots of relief .  )   Discussed the use of AI scribe software for clinical note transcription with the patient, who gave verbal consent to proceed.  History of Present Illness   [name] is a 78 y.o. female here for follow up who presents with chronic knee pain and skin rashes.   She experiences persistent skin dryness and itching, primarily affecting her legs, with smoother spots. She uses triamcinolone  0.1% cream, Cetaphil, and CeraVe for relief. Additionally, she uses [employer] anti-itch shampoo for scalp itchiness, which has been effective. She also experiences itchiness in her ears and left thumb, which 'crunches and wobbles'.  She experiences cramping at night but notes improvement in her sleep, now waking up only once to use the bathroom, a significant improvement from previously waking three to four times. She takes duloxetine  (Cymbalta ) and meloxicam  at night and is unsure if Cymbalta  causes sleepiness. She also uses Xyzal  for allergies.  She describes a burning sensation and weakness in her ankle, which sometimes gives out. Both knees previously experienced similar symptoms but have improved.  She had two sinus infections in July and August, which she attributes to possible allergies to [place] trees. She has a persistent scratchy throat and a history of sinus infections.  She recently received injections in her lower back for pain, which she reports is a new symptom for her. She has not experienced this type of pain before.  She traveled to [place]  recently, resulting in tailbone discomfort after the long drive.       Previous  HPI 05/13/2023 [name] is a 78 year old female who presents with chronic knee pain and skin rashes.   She experiences chronic knee pain, particularly in her knees, attributed to osteoarthritis. She has undergone a knee replacement in the past, which improved the pain except for the right knee. Previous injections did not provide significant relief. The pain sometimes feels like pinching tissue between the bones.   She describes persistent skin rashes and itching, primarily on her legs, present for years. The rashes are characterized by thick scabs and are currently better than in the past. She uses Cerave anti-itch lotion twice daily, which provides some relief. A different type of rash on her neck and back is described as 'pebbly' and constantly itchy. Topical steroids have not been helpful.   She reports swelling and discoloration in her toes and feet, initially thought to be related to gout or neuropathy. An ultrasound suggested stents for her leg arteries. Her mother had similar vein problems. She experiences leg swelling, attributed to venous insufficiency, with otherwise good circulation.   She has a history of hammer toe surgery and bone union surgery on her toes, which she feels have grown back. Certain shoes are more comfortable than her orthopedic ones.   She experiences seasonal allergy symptoms, including watery eyes and a rough throat, especially in the spring. She has used over-the-counter antihistamines like cetirizine in the past but finds them expensive and has not noticed a significant difference since starting  her current medication regimen.   Her sleep has improved, but she experiences increased daytime sleepiness, often falling asleep while watching television. She attributes this change to her current medication regimen, which includes Cymbalta  and meloxicam . She takes Cymbalta  at a low dose to manage her symptoms, as higher doses increase her sleepiness. She also takes  meloxicam  once daily without any noted issues.         Previous HPI 02/13/2023 [name] is a 78 y.o. female here for follow up for joint pain and skin rashes with positive ANA.  She has been experiencing pain in her feet, particularly in her toes, which become red, and sometimes the soles of her feet are affected. This has been ongoing for a couple of years. The pain can be severe enough to throw her off balance, and she experiences shooting pains and stiffness in her feet. She has been using meloxicam , which helps somewhat, but she notices increased pain if she delays taking it.   An ultrasound performed by a vascular specialist revealed valve problems. She was advised to elevate her legs more frequently and wear support stockings, which help with swelling but cause discomfort after prolonged use. She finds it challenging to elevate her legs as recommended.   She has been on Reclast  infusions for bone density for the past three years, following a year of Evenity  injections. She associates the onset of her symptoms with these treatments, particularly noting swelling and pain in her feet and hands. She is part of a study group for these medications but has not heard of others experiencing similar symptoms.   She has a history of being treated for psoriasis with creams and lotions, although her dermatologist has stated she does not have psoriasis. She has experienced hair loss and issues with her scalp, which have not improved. Her legs sometimes swell, and she has a history of a wound on one leg.   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 She reports that her toes were shortened, but she feels they may be growing again. She experiences pain in her knees and has been advised to consider knee replacement surgery.      Previous HPI 08/13/2022 [name] is a 78 y.o. female here for follow up for joint pain and  skin rashes with positive ANA.  Since her last visit she is overall doing fairly well he is taking the meloxicam  7.5 mg once every day when interrupting this she had an increase in joint pain and stiffness after about 3 days.  But tolerating this without any particular side effects.  She still having issues with getting discomfort swelling and redness affecting her feet usually at that end of the day and overnight.  Pain in her hands is most often at the thumbs or at the distal joint of the small fingers does not see this similar type of swelling or discoloration here.   Previous HPI 04/06/2022 Jianna Drabik is a 78 y.o. female here for follow up for joint pain and skin rashes with positive ANA.  Lab results from initial visit were negative for specific antibody markers there was a mildly elevated C-reactive protein of 12.3.  X-ray of bilateral hands was consistent with osteoarthritis with no inflammatory disease changes.  Since her last visit she continues having similar symptoms with joint pains particularly notices issues in her neck and back and shoulders as well as both hands and both feet.  She has not had as many episodes of toe redness swelling and painful sensation but still occur sometimes.  She has been using the topical triamcinolone  on skin rashes which is helpful but does not use this very regularly because it causes some skin burning and irritation.   Previous HPI 02/23/22 Christyna Letendre is a 78 y.o. female here for evaluation of joint pain in multiple areas with positive ANA and patient has history of psoriasis. Apparently recent dermatology evaluation not as certain about psoriasis as her diagnosis but has been present chronically waxing and waning in the past. 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 Beverley Millman. She has been treated with evenity  for osteopenia. She has longstanding history of psoriasis typically controlled with  topical medications and has never been on long-term systemic medication just occasional short-term prednisone for exacerbation.  She moved from [place]  about 3 years ago to be closer to family after retiring.  She experienced major flareup of skin disease especially bad areas on her scalp and lower extremities and completed a course of topical steroids did not afford the other prescribed medicine. She has neuropathy affecting distal foot and toes but with increased redness and pain in that area. She was concerned for possible gout due to acute exacerbation but uric acid level was 5.4.  She has longstanding history of abnormal labs raising concern for lupus but has never been diagnosed this goes back several decades.   Labs reviewed ANA 1:320 DFS RF neg CCP neg ESR 28 Uric acid 5.4   Review of Systems  Constitutional:  Positive for fatigue.  HENT:  Positive for mouth sores and mouth dryness.   Eyes:  Positive for dryness.  Respiratory:  Negative for shortness of breath.   Cardiovascular:  Positive for palpitations. Negative for chest pain.  Gastrointestinal:  Negative for blood in stool, constipation and diarrhea.  Endocrine: Negative for increased urination.  Genitourinary:  Positive for involuntary urination.  Musculoskeletal:  Positive for joint pain, gait problem, joint pain, joint swelling, myalgias, muscle weakness, muscle tenderness and myalgias. Negative for morning stiffness.  Skin:  Positive for color change, hair loss and sensitivity to sunlight. Negative for rash.  Allergic/Immunologic: Negative for susceptible to infections.  Neurological:  Positive for dizziness. Negative for headaches.  Hematological:  Negative for swollen glands.  Psychiatric/Behavioral:  Negative for depressed mood and sleep disturbance. The patient is not nervous/anxious.     PMFS History:  Patient Active Problem List   Diagnosis Date Noted   Actinic keratosis 08/15/2023   Chronic venous  insufficiency 05/13/2023   Bilateral foot pain 02/13/2023   Senile osteoporosis 12/07/2022   Peripheral edema 08/13/2022   Pain in right shoulder 04/06/2022   Bilateral hand pain 02/23/2022   Positive ANA (antinuclear antibody) 02/23/2022   Hallux valgus, acquired 07/01/2019   Abnormal EKG 01/20/2019   Tinnitus of right ear    Psoriasis    Generalized osteoarthritis of multiple sites    Osteopenia 01/05/2019    Past Medical History:  Diagnosis Date   Arthritis    Psoriasis    Intermittent; all over body and waxes and wanes   Tinnitus of right ear 2013   Intermittent; Melatonin has helped   Vaginal delivery 1973, 1981    Family History  Problem Relation Age of Onset   Dementia Mother    Heart disease Father        CABG x 5   Psoriasis Father  Psoriasis Sister    Osteoarthritis Sister    Pneumonia Maternal Grandfather    Heart attack Paternal Grandmother    Past Surgical History:  Procedure Laterality Date   goiter  1968   was on her Thyroid   IR KYPHO THORACIC WITH BONE BIOPSY  01/20/2019   JOINT REPLACEMENT     Knee replacement Right 2019   TOTAL HIP ARTHROPLASTY     Lt 2002, Rt 2005   VEIN LIGATION AND STRIPPING Right 2005   Social History   Social History Narrative   Was a runner, 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  Cardiovascular:     Rate  and Rhythm: Normal rate and regular rhythm.  Pulmonary:     Effort: Pulmonary effort is normal.     Breath sounds: Normal breath sounds.  Lymphadenopathy:     Cervical: No cervical adenopathy.  Skin:    General: Skin is warm and dry.     Comments: Trace edema b/l legs, none in feet Superficial tortuous veins  Neurological:     Mental Status: She is alert.  Psychiatric:        Mood and Affect: Mood normal.      Musculoskeletal Exam:  Shoulders full ROM no tenderness or swelling Elbows full ROM no tenderness or swelling Wrists full ROM no tenderness or swelling Fingers with first CMC joint squaring bilaterally, fifth DIP bony nodules  Low back and lateral hips tenderness to pressure, extending a short raise onto lateral thigh, no radiation with hip rotation or straight leg raise Knees full ROM no tenderness or swelling     Investigation: No additional findings.  Imaging: No results found.  Recent Labs: Lab Results  Component Value Date   WBC 6.3 08/13/2022   HGB 14.1 08/13/2022   PLT 311 08/13/2022   NA 144 08/13/2022   K 4.1 08/13/2022   CL 106 08/13/2022   CO2 30 08/13/2022   GLUCOSE 121 (H) 08/13/2022   BUN 20 08/13/2022   CREATININE 0.86 08/13/2022   CALCIUM 9.6 08/13/2022   GFRAA >60 01/20/2019    Speciality Comments: No specialty comments available.  Procedures:  No procedures performed Allergies: Patient has no known allergies.   Assessment / Plan:     Visit Diagnoses: Generalized osteoarthritis of multiple sites - Plan: meloxicam  (MOBIC ) 7.5 MG tablet, DISCONTINUED: DULoxetine  (CYMBALTA ) 20 MG capsule Joint pain and stiffness with left thumb crunching and finger locking. Medications well-tolerated. - Continue current medications including low dose meloxicam  7.5 mg and Cymbalta  20 mg.  Chronic right foot pain Persistent right foot pain with burning sensation and ankle weakness.  Psoriasis with chronic pruritus and dry skin Improvement with  triamcinolone  and moisturizers. Persistent scalp and ear itchiness managed with topicals. - Continue triamcinolone  0.1% cream as needed. - Continue use of Cetaphil and CeraVe  for skin dryness.  Osteoporosis on bisphosphonate therapy Osteoporosis managed with Reclast  to prevent bone loss and reduce fracture risk. - Continue Reclast  therapy as per current regimen.        Orders: No orders of the defined types were placed in this encounter.  Meds ordered this encounter  Medications   DISCONTD: DULoxetine  (CYMBALTA ) 20 MG capsule    Sig: Take 1 capsule (20 mg total) by mouth daily.    Dispense:  90 capsule    Refill:  1   triamcinolone  cream (KENALOG ) 0.1 %    Sig: Apply 1 Application topically as needed.    Dispense:  454 g    Refill:  0   meloxicam  (MOBIC ) 7.5 MG tablet    Sig: Take 1 tablet (7.5 mg total) by mouth daily. for pain    Dispense:  90 tablet    Refill:  1     Follow-Up Instructions: Return in about 6 months (around 05/12/2024) for OA/PsO on NSAID/CYM f/u 6mos.   Lonni LELON Ester, MD  Note - This record has been created using [employer].  Chart creation errors have been sought, but may not always  have been located. Such creation errors do not reflect on  the standard of medical care.

## 2023-11-12 ENCOUNTER — Ambulatory Visit: Attending: Internal Medicine | Admitting: Internal Medicine

## 2023-11-12 ENCOUNTER — Encounter: Payer: Self-pay | Admitting: Internal Medicine

## 2023-11-12 VITALS — BP 130/76 | HR 66 | Temp 97.1°F | Resp 16 | Ht 67.0 in | Wt 197.4 lb

## 2023-11-12 DIAGNOSIS — L409 Psoriasis, unspecified: Secondary | ICD-10-CM | POA: Diagnosis present

## 2023-11-12 DIAGNOSIS — M79672 Pain in left foot: Secondary | ICD-10-CM | POA: Insufficient documentation

## 2023-11-12 DIAGNOSIS — M79671 Pain in right foot: Secondary | ICD-10-CM | POA: Insufficient documentation

## 2023-11-12 DIAGNOSIS — R21 Rash and other nonspecific skin eruption: Secondary | ICD-10-CM | POA: Insufficient documentation

## 2023-11-12 DIAGNOSIS — M159 Polyosteoarthritis, unspecified: Secondary | ICD-10-CM | POA: Diagnosis present

## 2023-11-12 MED ORDER — MELOXICAM 7.5 MG PO TABS: 7.5000 mg | ORAL_TABLET | Freq: Every day | ORAL | 1 refills | Status: AC

## 2023-11-12 MED ORDER — DULOXETINE HCL 20 MG PO CPEP: 20.0000 mg | ORAL_CAPSULE | Freq: Every day | ORAL | 1 refills | Status: DC

## 2023-11-12 MED ORDER — TRIAMCINOLONE ACETONIDE 0.1 % EX CREA: 1.0000 | TOPICAL_CREAM | CUTANEOUS | 0 refills | Status: AC | PRN

## 2023-11-14 ENCOUNTER — Other Ambulatory Visit: Payer: Self-pay | Admitting: Internal Medicine

## 2023-11-14 DIAGNOSIS — M79671 Pain in right foot: Secondary | ICD-10-CM

## 2023-11-14 DIAGNOSIS — M159 Polyosteoarthritis, unspecified: Secondary | ICD-10-CM

## 2023-11-14 NOTE — Telephone Encounter (Signed)
 Last Fill: 11/12/2023  Next Visit: 05/15/2024  Last Visit: 11/12/2023  Dx: Generalized osteoarthritis of multiple sites   Current Dose per office note on 11/12/2023: dose not mentioned  Okay to refill Cymbalta ?

## 2024-01-02 ENCOUNTER — Telehealth (HOSPITAL_COMMUNITY): Payer: Self-pay | Admitting: Pharmacy Technician

## 2024-01-02 ENCOUNTER — Other Ambulatory Visit (HOSPITAL_COMMUNITY): Payer: Self-pay | Admitting: Sports Medicine

## 2024-01-02 NOTE — Telephone Encounter (Signed)
 Auth Submission: NO AUTH NEEDED Site of care: [facility] Payer: Medicare A/B, [employer] Supp   Medication & CPT/J Code(s) submitted: Reclast  (Zolendronic acid) J3489 Diagnosis Code: M81.0 Route of submission (phone, fax, portal):  Phone # Fax # Auth type: Buy/Bill HB Units/visits requested: 5MG  X 1 DOSE, EVERY 12 MONTHS Reference number:  Approval from: 01/02/2024 to 02/15/24    Dagoberto Armour, CPhT [facility] Phone: [phone] 01/02/2024

## 2024-01-03 LAB — LAB REPORT - SCANNED: EGFR: 83

## 2024-02-12 ENCOUNTER — Other Ambulatory Visit: Payer: Self-pay | Admitting: Physician Assistant

## 2024-04-01 ENCOUNTER — Ambulatory Visit

## 2024-05-15 ENCOUNTER — Ambulatory Visit: Admitting: Internal Medicine
